# Patient Record
Sex: Female | Born: 1950 | Race: Black or African American | Hispanic: No
Health system: Southern US, Community
[De-identification: ages and names within clinical notes are randomized; demographics above are authoritative.]

## PROBLEM LIST (undated history)

## (undated) ENCOUNTER — Emergency Department (HOSPITAL_COMMUNITY): Payer: Medicare Other

## (undated) DIAGNOSIS — F319 Bipolar disorder, unspecified: Secondary | ICD-10-CM

## (undated) DIAGNOSIS — I639 Cerebral infarction, unspecified: Secondary | ICD-10-CM

## (undated) DIAGNOSIS — G629 Polyneuropathy, unspecified: Secondary | ICD-10-CM

## (undated) DIAGNOSIS — I10 Essential (primary) hypertension: Secondary | ICD-10-CM

## (undated) DIAGNOSIS — R7303 Prediabetes: Secondary | ICD-10-CM

## (undated) DIAGNOSIS — M797 Fibromyalgia: Secondary | ICD-10-CM

## (undated) DIAGNOSIS — M1990 Unspecified osteoarthritis, unspecified site: Secondary | ICD-10-CM

## (undated) DIAGNOSIS — D869 Sarcoidosis, unspecified: Secondary | ICD-10-CM

## (undated) DIAGNOSIS — D759 Disease of blood and blood-forming organs, unspecified: Secondary | ICD-10-CM

## (undated) DIAGNOSIS — J45909 Unspecified asthma, uncomplicated: Secondary | ICD-10-CM

## (undated) DIAGNOSIS — F329 Major depressive disorder, single episode, unspecified: Secondary | ICD-10-CM

## (undated) DIAGNOSIS — F32A Depression, unspecified: Secondary | ICD-10-CM

## (undated) DIAGNOSIS — R519 Headache, unspecified: Secondary | ICD-10-CM

## (undated) DIAGNOSIS — R51 Headache: Secondary | ICD-10-CM

## (undated) DIAGNOSIS — I2510 Atherosclerotic heart disease of native coronary artery without angina pectoris: Secondary | ICD-10-CM

## (undated) DIAGNOSIS — Z87442 Personal history of urinary calculi: Secondary | ICD-10-CM

## (undated) DIAGNOSIS — E785 Hyperlipidemia, unspecified: Secondary | ICD-10-CM

## (undated) DIAGNOSIS — G4730 Sleep apnea, unspecified: Secondary | ICD-10-CM

## (undated) DIAGNOSIS — D649 Anemia, unspecified: Secondary | ICD-10-CM

## (undated) HISTORY — PX: APPENDECTOMY: SHX54

## (undated) HISTORY — PX: BREAST SURGERY: SHX581

## (undated) HISTORY — PX: COLON SURGERY: SHX602

## (undated) HISTORY — PX: HERNIA REPAIR: SHX51

## (undated) HISTORY — DX: Anemia, unspecified: D64.9

## (undated) HISTORY — PX: BREAST LUMPECTOMY: SHX2

## (undated) HISTORY — PX: OTHER SURGICAL HISTORY: SHX169

## (undated) HISTORY — DX: Disease of blood and blood-forming organs, unspecified: D75.9

## (undated) HISTORY — PX: UMBILICAL HERNIA REPAIR: SHX196

## (undated) HISTORY — PX: TUBAL LIGATION: SHX77

## (undated) HISTORY — DX: Hyperlipidemia, unspecified: E78.5

## (undated) HISTORY — PX: CARDIAC CATHETERIZATION: SHX172

## (undated) HISTORY — DX: Sarcoidosis, unspecified: D86.9

## (undated) HISTORY — PX: CATARACT EXTRACTION: SUR2

## (undated) HISTORY — PX: ABDOMINAL HYSTERECTOMY: SHX81

---

## 1999-03-30 ENCOUNTER — Emergency Department (HOSPITAL_COMMUNITY): Admission: EM | Admit: 1999-03-30 | Discharge: 1999-03-30 | Payer: Self-pay | Admitting: Emergency Medicine

## 1999-03-30 ENCOUNTER — Encounter: Payer: Self-pay | Admitting: Emergency Medicine

## 2001-01-05 ENCOUNTER — Ambulatory Visit (HOSPITAL_COMMUNITY): Admission: RE | Admit: 2001-01-05 | Discharge: 2001-01-05 | Payer: Self-pay | Admitting: Internal Medicine

## 2001-01-05 ENCOUNTER — Encounter: Payer: Self-pay | Admitting: Internal Medicine

## 2001-01-13 ENCOUNTER — Encounter: Admission: RE | Admit: 2001-01-13 | Discharge: 2001-01-13 | Payer: Self-pay | Admitting: Internal Medicine

## 2001-01-13 ENCOUNTER — Encounter: Payer: Self-pay | Admitting: Internal Medicine

## 2001-01-26 ENCOUNTER — Other Ambulatory Visit: Admission: RE | Admit: 2001-01-26 | Discharge: 2001-01-26 | Payer: Self-pay | Admitting: Internal Medicine

## 2001-11-08 ENCOUNTER — Encounter: Payer: Self-pay | Admitting: Family Medicine

## 2001-11-08 ENCOUNTER — Encounter: Admission: RE | Admit: 2001-11-08 | Discharge: 2001-11-08 | Payer: Self-pay | Admitting: Family Medicine

## 2001-12-22 ENCOUNTER — Ambulatory Visit (HOSPITAL_COMMUNITY): Admission: RE | Admit: 2001-12-22 | Discharge: 2001-12-22 | Payer: Self-pay | Admitting: Neurology

## 2001-12-22 ENCOUNTER — Encounter: Payer: Self-pay | Admitting: Neurology

## 2002-01-22 ENCOUNTER — Encounter: Payer: Self-pay | Admitting: Neurology

## 2002-01-22 ENCOUNTER — Ambulatory Visit (HOSPITAL_COMMUNITY): Admission: RE | Admit: 2002-01-22 | Discharge: 2002-01-22 | Payer: Self-pay | Admitting: Neurology

## 2002-03-21 ENCOUNTER — Encounter: Payer: Self-pay | Admitting: Family Medicine

## 2002-03-21 ENCOUNTER — Ambulatory Visit (HOSPITAL_COMMUNITY): Admission: RE | Admit: 2002-03-21 | Discharge: 2002-03-21 | Payer: Self-pay | Admitting: Family Medicine

## 2003-01-05 ENCOUNTER — Encounter: Payer: Self-pay | Admitting: Rheumatology

## 2003-01-05 ENCOUNTER — Ambulatory Visit (HOSPITAL_COMMUNITY): Admission: RE | Admit: 2003-01-05 | Discharge: 2003-01-05 | Payer: Self-pay | Admitting: Rheumatology

## 2003-06-08 ENCOUNTER — Encounter: Admission: RE | Admit: 2003-06-08 | Discharge: 2003-06-08 | Payer: Self-pay | Admitting: Family Medicine

## 2003-06-08 ENCOUNTER — Encounter: Payer: Self-pay | Admitting: Family Medicine

## 2003-08-09 ENCOUNTER — Emergency Department (HOSPITAL_COMMUNITY): Admission: AD | Admit: 2003-08-09 | Discharge: 2003-08-09 | Payer: Self-pay | Admitting: Family Medicine

## 2003-11-23 ENCOUNTER — Emergency Department (HOSPITAL_COMMUNITY): Admission: EM | Admit: 2003-11-23 | Discharge: 2003-11-23 | Payer: Self-pay | Admitting: Emergency Medicine

## 2004-03-14 ENCOUNTER — Emergency Department (HOSPITAL_COMMUNITY): Admission: EM | Admit: 2004-03-14 | Discharge: 2004-03-14 | Payer: Self-pay | Admitting: Family Medicine

## 2004-03-21 ENCOUNTER — Other Ambulatory Visit: Admission: RE | Admit: 2004-03-21 | Discharge: 2004-03-21 | Payer: Self-pay | Admitting: Obstetrics and Gynecology

## 2004-06-23 ENCOUNTER — Ambulatory Visit (HOSPITAL_COMMUNITY): Admission: RE | Admit: 2004-06-23 | Discharge: 2004-06-23 | Payer: Self-pay | Admitting: Gastroenterology

## 2004-07-01 ENCOUNTER — Ambulatory Visit: Payer: Self-pay | Admitting: Cardiology

## 2004-07-03 ENCOUNTER — Encounter: Admission: RE | Admit: 2004-07-03 | Discharge: 2004-07-03 | Payer: Self-pay | Admitting: Cardiology

## 2004-08-12 ENCOUNTER — Ambulatory Visit: Payer: Self-pay | Admitting: Cardiology

## 2004-09-03 ENCOUNTER — Ambulatory Visit: Payer: Self-pay | Admitting: Cardiology

## 2004-09-10 ENCOUNTER — Encounter: Admission: RE | Admit: 2004-09-10 | Discharge: 2004-12-09 | Payer: Self-pay | Admitting: Cardiology

## 2004-10-29 ENCOUNTER — Ambulatory Visit: Payer: Self-pay | Admitting: Cardiology

## 2004-11-07 ENCOUNTER — Ambulatory Visit: Payer: Self-pay | Admitting: Psychiatry

## 2004-11-07 ENCOUNTER — Inpatient Hospital Stay (HOSPITAL_COMMUNITY): Admission: RE | Admit: 2004-11-07 | Discharge: 2004-11-13 | Payer: Self-pay | Admitting: Psychiatry

## 2004-11-11 ENCOUNTER — Ambulatory Visit (HOSPITAL_COMMUNITY): Admission: RE | Admit: 2004-11-11 | Discharge: 2004-11-11 | Payer: Self-pay | Admitting: Psychiatry

## 2004-12-03 ENCOUNTER — Ambulatory Visit: Payer: Self-pay | Admitting: Cardiology

## 2005-01-14 ENCOUNTER — Ambulatory Visit: Payer: Self-pay | Admitting: Cardiology

## 2005-02-02 ENCOUNTER — Emergency Department (HOSPITAL_COMMUNITY): Admission: EM | Admit: 2005-02-02 | Discharge: 2005-02-02 | Payer: Self-pay | Admitting: Emergency Medicine

## 2005-04-28 ENCOUNTER — Other Ambulatory Visit: Admission: RE | Admit: 2005-04-28 | Discharge: 2005-04-28 | Payer: Self-pay | Admitting: Obstetrics and Gynecology

## 2005-05-22 ENCOUNTER — Encounter (INDEPENDENT_AMBULATORY_CARE_PROVIDER_SITE_OTHER): Payer: Self-pay | Admitting: *Deleted

## 2005-05-22 ENCOUNTER — Inpatient Hospital Stay (HOSPITAL_COMMUNITY): Admission: RE | Admit: 2005-05-22 | Discharge: 2005-05-31 | Payer: Self-pay | Admitting: Obstetrics and Gynecology

## 2005-12-16 ENCOUNTER — Emergency Department (HOSPITAL_COMMUNITY): Admission: EM | Admit: 2005-12-16 | Discharge: 2005-12-16 | Payer: Self-pay | Admitting: Family Medicine

## 2006-06-29 ENCOUNTER — Encounter: Admission: RE | Admit: 2006-06-29 | Discharge: 2006-06-29 | Payer: Self-pay | Admitting: Obstetrics and Gynecology

## 2006-08-05 ENCOUNTER — Emergency Department (HOSPITAL_COMMUNITY): Admission: EM | Admit: 2006-08-05 | Discharge: 2006-08-05 | Payer: Self-pay | Admitting: Family Medicine

## 2007-01-11 ENCOUNTER — Encounter: Admission: RE | Admit: 2007-01-11 | Discharge: 2007-01-11 | Payer: Self-pay | Admitting: Obstetrics and Gynecology

## 2007-03-29 ENCOUNTER — Ambulatory Visit (HOSPITAL_COMMUNITY): Admission: RE | Admit: 2007-03-29 | Discharge: 2007-03-29 | Payer: Self-pay | Admitting: Family Medicine

## 2007-06-27 ENCOUNTER — Emergency Department (HOSPITAL_COMMUNITY): Admission: EM | Admit: 2007-06-27 | Discharge: 2007-06-27 | Payer: Self-pay | Admitting: Emergency Medicine

## 2007-07-01 ENCOUNTER — Encounter: Admission: RE | Admit: 2007-07-01 | Discharge: 2007-07-01 | Payer: Self-pay | Admitting: Family Medicine

## 2008-05-30 ENCOUNTER — Emergency Department (HOSPITAL_COMMUNITY): Admission: EM | Admit: 2008-05-30 | Discharge: 2008-05-31 | Payer: Self-pay | Admitting: Emergency Medicine

## 2009-01-08 ENCOUNTER — Ambulatory Visit (HOSPITAL_COMMUNITY): Admission: RE | Admit: 2009-01-08 | Discharge: 2009-01-08 | Payer: Self-pay | Admitting: Rheumatology

## 2009-05-18 ENCOUNTER — Emergency Department (HOSPITAL_COMMUNITY): Admission: EM | Admit: 2009-05-18 | Discharge: 2009-05-19 | Payer: Self-pay | Admitting: Emergency Medicine

## 2009-05-21 ENCOUNTER — Inpatient Hospital Stay (HOSPITAL_COMMUNITY): Admission: EM | Admit: 2009-05-21 | Discharge: 2009-05-25 | Payer: Self-pay | Admitting: Emergency Medicine

## 2009-07-01 ENCOUNTER — Encounter: Admission: RE | Admit: 2009-07-01 | Discharge: 2009-07-01 | Payer: Self-pay | Admitting: Gastroenterology

## 2009-07-26 ENCOUNTER — Ambulatory Visit: Payer: Self-pay | Admitting: Oncology

## 2009-08-01 LAB — CBC WITH DIFFERENTIAL/PLATELET
Basophils Absolute: 0 10*3/uL (ref 0.0–0.1)
EOS%: 3.5 % (ref 0.0–7.0)
Eosinophils Absolute: 0.3 10*3/uL (ref 0.0–0.5)
HCT: 35.2 % (ref 34.8–46.6)
HGB: 11.4 g/dL — ABNORMAL LOW (ref 11.6–15.9)
MCH: 27.5 pg (ref 25.1–34.0)
MONO#: 0.6 10*3/uL (ref 0.1–0.9)
NEUT#: 3.3 10*3/uL (ref 1.5–6.5)
NEUT%: 45.8 % (ref 38.4–76.8)
RDW: 13.2 % (ref 11.2–14.5)
WBC: 7.2 10*3/uL (ref 3.9–10.3)
lymph#: 3 10*3/uL (ref 0.9–3.3)

## 2009-08-05 ENCOUNTER — Ambulatory Visit (HOSPITAL_COMMUNITY): Admission: RE | Admit: 2009-08-05 | Discharge: 2009-08-05 | Payer: Self-pay | Admitting: Oncology

## 2009-08-05 LAB — COMPREHENSIVE METABOLIC PANEL
AST: 14 U/L (ref 0–37)
Albumin: 4.1 g/dL (ref 3.5–5.2)
BUN: 17 mg/dL (ref 6–23)
CO2: 27 mEq/L (ref 19–32)
Calcium: 9.2 mg/dL (ref 8.4–10.5)
Chloride: 99 mEq/L (ref 96–112)
Creatinine, Ser: 1.24 mg/dL — ABNORMAL HIGH (ref 0.40–1.20)
Potassium: 3.6 mEq/L (ref 3.5–5.3)

## 2009-08-05 LAB — SPEP & IFE WITH QIG
Albumin ELP: 48.8 % — ABNORMAL LOW (ref 55.8–66.1)
Alpha-1-Globulin: 4.5 % (ref 2.9–4.9)
Alpha-2-Globulin: 11.4 % (ref 7.1–11.8)
Beta 2: 9.5 % — ABNORMAL HIGH (ref 3.2–6.5)
IgM, Serum: 301 mg/dL — ABNORMAL HIGH (ref 60–263)

## 2009-08-05 LAB — LACTATE DEHYDROGENASE: LDH: 158 U/L (ref 94–250)

## 2009-08-05 LAB — KAPPA/LAMBDA LIGHT CHAINS
Kappa:Lambda Ratio: 1.02 (ref 0.26–1.65)
Lambda Free Lght Chn: 1.69 mg/dL (ref 0.57–2.63)

## 2009-08-14 ENCOUNTER — Encounter: Admission: RE | Admit: 2009-08-14 | Discharge: 2009-08-30 | Payer: Self-pay | Admitting: Sports Medicine

## 2009-08-22 ENCOUNTER — Ambulatory Visit: Payer: Self-pay | Admitting: Oncology

## 2009-09-12 ENCOUNTER — Encounter: Admission: RE | Admit: 2009-09-12 | Discharge: 2009-10-09 | Payer: Self-pay | Admitting: Sports Medicine

## 2010-02-14 ENCOUNTER — Ambulatory Visit: Payer: Self-pay | Admitting: Oncology

## 2010-02-25 LAB — CBC WITH DIFFERENTIAL/PLATELET
EOS%: 2.4 % (ref 0.0–7.0)
Eosinophils Absolute: 0.2 10*3/uL (ref 0.0–0.5)
LYMPH%: 33.8 % (ref 14.0–49.7)
MCV: 84.1 fL (ref 79.5–101.0)
MONO#: 0.5 10*3/uL (ref 0.1–0.9)
MONO%: 7.1 % (ref 0.0–14.0)
NEUT#: 3.8 10*3/uL (ref 1.5–6.5)
NEUT%: 56.4 % (ref 38.4–76.8)
RDW: 13.8 % (ref 11.2–14.5)
lymph#: 2.3 10*3/uL (ref 0.9–3.3)

## 2010-02-27 LAB — SPEP & IFE WITH QIG
Albumin ELP: 50 % — ABNORMAL LOW (ref 55.8–66.1)
Beta 2: 8.1 % — ABNORMAL HIGH (ref 3.2–6.5)
Gamma Globulin: 18.4 % (ref 11.1–18.8)
IgA: 511 mg/dL — ABNORMAL HIGH (ref 68–378)
IgM, Serum: 275 mg/dL — ABNORMAL HIGH (ref 60–263)

## 2010-02-27 LAB — KAPPA/LAMBDA LIGHT CHAINS
Kappa:Lambda Ratio: 0.83 (ref 0.26–1.65)
Lambda Free Lght Chn: 1.49 mg/dL (ref 0.57–2.63)

## 2010-02-27 LAB — COMPREHENSIVE METABOLIC PANEL
AST: 14 U/L (ref 0–37)
Alkaline Phosphatase: 92 U/L (ref 39–117)
Glucose, Bld: 101 mg/dL — ABNORMAL HIGH (ref 70–99)
Sodium: 139 mEq/L (ref 135–145)
Total Bilirubin: 0.3 mg/dL (ref 0.3–1.2)
Total Protein: 7.1 g/dL (ref 6.0–8.3)

## 2010-03-05 ENCOUNTER — Emergency Department (HOSPITAL_COMMUNITY): Admission: EM | Admit: 2010-03-05 | Discharge: 2010-03-05 | Payer: Self-pay | Admitting: Emergency Medicine

## 2010-05-30 ENCOUNTER — Emergency Department (HOSPITAL_COMMUNITY): Admission: EM | Admit: 2010-05-30 | Discharge: 2010-05-30 | Payer: Self-pay | Admitting: Emergency Medicine

## 2010-08-28 ENCOUNTER — Ambulatory Visit: Payer: Self-pay | Admitting: Oncology

## 2010-09-03 LAB — CBC WITH DIFFERENTIAL/PLATELET
BASO%: 0.3 % (ref 0.0–2.0)
Basophils Absolute: 0 10*3/uL (ref 0.0–0.1)
EOS%: 3.1 % (ref 0.0–7.0)
Eosinophils Absolute: 0.2 10*3/uL (ref 0.0–0.5)
HCT: 35.6 % (ref 34.8–46.6)
HGB: 11.7 g/dL (ref 11.6–15.9)
LYMPH%: 46.1 % (ref 14.0–49.7)
MCH: 27.1 pg (ref 25.1–34.0)
MCHC: 32.9 g/dL (ref 31.5–36.0)
MCV: 82.4 fL (ref 79.5–101.0)
MONO#: 0.5 10*3/uL (ref 0.1–0.9)
MONO%: 7.3 % (ref 0.0–14.0)
NEUT#: 2.7 10*3/uL (ref 1.5–6.5)
NEUT%: 43.2 % (ref 38.4–76.8)
Platelets: 189 10*3/uL (ref 145–400)
RBC: 4.32 10*6/uL (ref 3.70–5.45)
RDW: 12.9 % (ref 11.2–14.5)
WBC: 6.2 10*3/uL (ref 3.9–10.3)
lymph#: 2.8 10*3/uL (ref 0.9–3.3)

## 2010-09-05 LAB — COMPREHENSIVE METABOLIC PANEL
ALT: 10 U/L (ref 0–35)
AST: 17 U/L (ref 0–37)
Albumin: 3.8 g/dL (ref 3.5–5.2)
Alkaline Phosphatase: 98 U/L (ref 39–117)
BUN: 8 mg/dL (ref 6–23)
CO2: 28 mEq/L (ref 19–32)
Calcium: 9.1 mg/dL (ref 8.4–10.5)
Chloride: 99 mEq/L (ref 96–112)
Creatinine, Ser: 0.97 mg/dL (ref 0.40–1.20)
Glucose, Bld: 95 mg/dL (ref 70–99)
Potassium: 3.6 mEq/L (ref 3.5–5.3)
Sodium: 137 mEq/L (ref 135–145)
Total Bilirubin: 0.3 mg/dL (ref 0.3–1.2)
Total Protein: 7.4 g/dL (ref 6.0–8.3)

## 2010-09-05 LAB — SPEP & IFE WITH QIG
Albumin ELP: 49.5 % — ABNORMAL LOW (ref 55.8–66.1)
Alpha-1-Globulin: 4.5 % (ref 2.9–4.9)
Alpha-2-Globulin: 11.3 % (ref 7.1–11.8)
Beta 2: 9.4 % — ABNORMAL HIGH (ref 3.2–6.5)
Beta Globulin: 6.7 % (ref 4.7–7.2)
Gamma Globulin: 18.6 % (ref 11.1–18.8)
IgA: 524 mg/dL — ABNORMAL HIGH (ref 68–378)
IgG (Immunoglobin G), Serum: 1270 mg/dL (ref 694–1618)
IgM, Serum: 284 mg/dL — ABNORMAL HIGH (ref 60–263)
Total Protein, Serum Electrophoresis: 7.4 g/dL (ref 6.0–8.3)

## 2010-09-05 LAB — KAPPA/LAMBDA LIGHT CHAINS
Kappa free light chain: 1.1 mg/dL (ref 0.33–1.94)
Kappa:Lambda Ratio: 1.12 (ref 0.26–1.65)
Lambda Free Lght Chn: 0.98 mg/dL (ref 0.57–2.63)

## 2010-10-22 ENCOUNTER — Other Ambulatory Visit: Payer: Self-pay

## 2010-10-22 ENCOUNTER — Ambulatory Visit (HOSPITAL_COMMUNITY)
Admission: RE | Admit: 2010-10-22 | Discharge: 2010-10-22 | Disposition: A | Discharge status: Home / Self Care | Payer: Commercial Managed Care - PPO | Source: Ambulatory Visit | Attending: Family Medicine | Admitting: Family Medicine

## 2010-10-22 DIAGNOSIS — K5900 Constipation, unspecified: Secondary | ICD-10-CM | POA: Insufficient documentation

## 2010-10-22 DIAGNOSIS — R52 Pain, unspecified: Secondary | ICD-10-CM

## 2010-10-22 DIAGNOSIS — R109 Unspecified abdominal pain: Secondary | ICD-10-CM | POA: Insufficient documentation

## 2010-11-13 LAB — COMPREHENSIVE METABOLIC PANEL
ALT: 16 U/L (ref 0–35)
Albumin: 3.4 g/dL — ABNORMAL LOW (ref 3.5–5.2)
Alkaline Phosphatase: 108 U/L (ref 39–117)
BUN: 14 mg/dL (ref 6–23)
Chloride: 100 mEq/L (ref 96–112)
Glucose, Bld: 109 mg/dL — ABNORMAL HIGH (ref 70–99)
Potassium: 3.4 mEq/L — ABNORMAL LOW (ref 3.5–5.1)
Sodium: 137 mEq/L (ref 135–145)
Total Bilirubin: 0.4 mg/dL (ref 0.3–1.2)
Total Protein: 7.5 g/dL (ref 6.0–8.3)

## 2010-11-13 LAB — CBC
MCH: 27.3 pg (ref 26.0–34.0)
MCHC: 32.5 g/dL (ref 30.0–36.0)
Platelets: 171 10*3/uL (ref 150–400)
RDW: 13.4 % (ref 11.5–15.5)

## 2010-11-13 LAB — URINALYSIS, ROUTINE W REFLEX MICROSCOPIC
Bilirubin Urine: NEGATIVE
Ketones, ur: NEGATIVE mg/dL
Nitrite: NEGATIVE
Urobilinogen, UA: 0.2 mg/dL (ref 0.0–1.0)

## 2010-11-13 LAB — DIFFERENTIAL
Basophils Absolute: 0 10*3/uL (ref 0.0–0.1)
Basophils Relative: 0 % (ref 0–1)
Eosinophils Absolute: 0.2 10*3/uL (ref 0.0–0.7)
Monocytes Absolute: 0.5 10*3/uL (ref 0.1–1.0)
Monocytes Relative: 6 % (ref 3–12)
Neutro Abs: 4 10*3/uL (ref 1.7–7.7)

## 2010-11-13 LAB — CK: Total CK: 211 U/L — ABNORMAL HIGH (ref 7–177)

## 2010-12-05 LAB — POCT I-STAT, CHEM 8
BUN: 14 mg/dL (ref 6–23)
Calcium, Ion: 1.12 mmol/L (ref 1.12–1.32)
Chloride: 98 mEq/L (ref 96–112)
HCT: 43 % (ref 36.0–46.0)
Potassium: 3.7 mEq/L (ref 3.5–5.1)
Sodium: 136 mEq/L (ref 135–145)

## 2010-12-05 LAB — URINALYSIS, MICROSCOPIC ONLY
Bilirubin Urine: NEGATIVE
Hgb urine dipstick: NEGATIVE
Ketones, ur: NEGATIVE mg/dL
Leukocytes, UA: NEGATIVE
Nitrite: NEGATIVE
Protein, ur: NEGATIVE mg/dL
Urobilinogen, UA: 1 mg/dL (ref 0.0–1.0)
pH: 6 (ref 5.0–8.0)

## 2010-12-05 LAB — DIFFERENTIAL
Basophils Absolute: 0 10*3/uL (ref 0.0–0.1)
Basophils Absolute: 0 10*3/uL (ref 0.0–0.1)
Basophils Relative: 0 % (ref 0–1)
Basophils Relative: 0 % (ref 0–1)
Eosinophils Absolute: 0.1 K/uL (ref 0.0–0.7)
Eosinophils Relative: 1 % (ref 0–5)
Lymphocytes Relative: 17 % (ref 12–46)
Lymphocytes Relative: 24 % (ref 12–46)
Lymphs Abs: 2.1 K/uL (ref 0.7–4.0)
Lymphs Abs: 2.5 10*3/uL (ref 0.7–4.0)
Monocytes Absolute: 0.6 10*3/uL (ref 0.1–1.0)
Monocytes Absolute: 0.6 K/uL (ref 0.1–1.0)
Monocytes Relative: 10 % (ref 3–12)
Monocytes Relative: 7 % (ref 3–12)
Neutro Abs: 2.8 10*3/uL (ref 1.7–7.7)
Neutro Abs: 6 10*3/uL (ref 1.7–7.7)
Neutro Abs: 8.6 10*3/uL — ABNORMAL HIGH (ref 1.7–7.7)
Neutrophils Relative %: 46 % (ref 43–77)
Neutrophils Relative %: 68 % (ref 43–77)
Neutrophils Relative %: 80 % — ABNORMAL HIGH (ref 43–77)

## 2010-12-05 LAB — COMPREHENSIVE METABOLIC PANEL
ALT: 13 U/L (ref 0–35)
AST: 16 U/L (ref 0–37)
BUN: 7 mg/dL (ref 6–23)
CO2: 28 mEq/L (ref 19–32)
CO2: 30 mEq/L (ref 19–32)
Chloride: 108 mEq/L (ref 96–112)
Chloride: 98 mEq/L (ref 96–112)
Creatinine, Ser: 1 mg/dL (ref 0.4–1.2)
Creatinine, Ser: 1.11 mg/dL (ref 0.4–1.2)
GFR calc Af Amer: >60 mL/min (ref >60)
GFR calc non Af Amer: 51 mL/min — ABNORMAL LOW (ref >60)
Total Bilirubin: 0.6 mg/dL (ref 0.3–1.2)

## 2010-12-05 LAB — LIPID PANEL: HDL: 49 mg/dL (ref >39)

## 2010-12-05 LAB — BASIC METABOLIC PANEL
BUN: 3 mg/dL — ABNORMAL LOW (ref 6–23)
BUN: 3 mg/dL — ABNORMAL LOW (ref 6–23)
CO2: 26 mEq/L (ref 19–32)
CO2: 28 mEq/L (ref 19–32)
Calcium: 8.8 mg/dL (ref 8.4–10.5)
Chloride: 104 mEq/L (ref 96–112)
Chloride: 105 mEq/L (ref 96–112)
GFR calc Af Amer: >60 mL/min (ref >60)
GFR calc Af Amer: >60 mL/min (ref >60)
GFR calc non Af Amer: 57 mL/min — ABNORMAL LOW (ref >60)
Glucose, Bld: 107 mg/dL — ABNORMAL HIGH (ref 70–99)
Glucose, Bld: 108 mg/dL — ABNORMAL HIGH (ref 70–99)
Potassium: 3.6 mEq/L (ref 3.5–5.1)
Potassium: 3.8 mEq/L (ref 3.5–5.1)
Potassium: 3.8 mEq/L (ref 3.5–5.1)
Sodium: 140 mEq/L (ref 135–145)

## 2010-12-05 LAB — COMPREHENSIVE METABOLIC PANEL WITH GFR
ALT: 16 U/L (ref 0–35)
AST: 19 U/L (ref 0–37)
Albumin: 3.3 g/dL — ABNORMAL LOW (ref 3.5–5.2)
Alkaline Phosphatase: 77 U/L (ref 39–117)
Calcium: 9 mg/dL (ref 8.4–10.5)
GFR calc Af Amer: >60 mL/min (ref >60)
Glucose, Bld: 101 mg/dL — ABNORMAL HIGH (ref 70–99)
Potassium: 3.7 meq/L (ref 3.5–5.1)
Sodium: 136 meq/L (ref 135–145)
Total Protein: 7.3 g/dL (ref 6.0–8.3)

## 2010-12-05 LAB — CBC
HCT: 30.5 % — ABNORMAL LOW (ref 36.0–46.0)
HCT: 31.4 % — ABNORMAL LOW (ref 36.0–46.0)
HCT: 32.1 % — ABNORMAL LOW (ref 36.0–46.0)
HCT: 36.5 % (ref 36.0–46.0)
HCT: 36.8 % (ref 36.0–46.0)
Hemoglobin: 10.2 g/dL — ABNORMAL LOW (ref 12.0–15.0)
Hemoglobin: 12 g/dL (ref 12.0–15.0)
Hemoglobin: 12.1 g/dL (ref 12.0–15.0)
Hemoglobin: 13.4 g/dL (ref 12.0–15.0)
MCHC: 32.9 g/dL (ref 30.0–36.0)
MCHC: 33.3 g/dL (ref 30.0–36.0)
MCHC: 33.6 g/dL (ref 30.0–36.0)
MCV: 84.5 fL (ref 78.0–100.0)
MCV: 85 fL (ref 78.0–100.0)
MCV: 85 fL (ref 78.0–100.0)
MCV: 85.2 fL (ref 78.0–100.0)
Platelets: 154 10*3/uL (ref 150–400)
Platelets: 193 10*3/uL (ref 150–400)
Platelets: 217 K/uL (ref 150–400)
Platelets: 270 10*3/uL (ref 150–400)
RBC: 3.62 MIL/uL — ABNORMAL LOW (ref 3.87–5.11)
RBC: 3.78 MIL/uL — ABNORMAL LOW (ref 3.87–5.11)
RBC: 4.32 MIL/uL (ref 3.87–5.11)
RDW: 13.3 % (ref 11.5–15.5)
RDW: 13.8 % (ref 11.5–15.5)
RDW: 13.9 % (ref 11.5–15.5)
RDW: 14.1 % (ref 11.5–15.5)
WBC: 5.6 10*3/uL (ref 4.0–10.5)
WBC: 6 10*3/uL (ref 4.0–10.5)
WBC: 6.5 10*3/uL (ref 4.0–10.5)
WBC: 8.9 10*3/uL (ref 4.0–10.5)

## 2010-12-05 LAB — URINALYSIS, ROUTINE W REFLEX MICROSCOPIC
Bilirubin Urine: NEGATIVE
Glucose, UA: NEGATIVE mg/dL
Glucose, UA: NEGATIVE mg/dL
Hgb urine dipstick: NEGATIVE
Ketones, ur: NEGATIVE mg/dL
Ketones, ur: NEGATIVE mg/dL
Nitrite: NEGATIVE
Nitrite: NEGATIVE
Protein, ur: NEGATIVE mg/dL
Specific Gravity, Urine: 1.009 (ref 1.005–1.030)
Specific Gravity, Urine: >1.046 — ABNORMAL HIGH (ref 1.005–1.030)
Urobilinogen, UA: 0.2 mg/dL (ref 0.0–1.0)
pH: 6 (ref 5.0–8.0)
pH: 7.5 (ref 5.0–8.0)

## 2010-12-05 LAB — LIPASE, BLOOD: Lipase: 30 U/L (ref 11–59)

## 2010-12-05 LAB — URINE CULTURE
Colony Count: 90000
Colony Count: NO GROWTH

## 2010-12-05 LAB — FERRITIN: Ferritin: 191 ng/mL (ref 10–291)

## 2010-12-05 LAB — HEMOCCULT GUIAC POC 1CARD (OFFICE): Fecal Occult Bld: NEGATIVE

## 2010-12-05 LAB — T4, FREE: Free T4: 0.73 ng/dL — ABNORMAL LOW (ref 0.80–1.80)

## 2010-12-05 LAB — VITAMIN B12: Vitamin B-12: 332 pg/mL (ref 211–911)

## 2010-12-05 LAB — CULTURE, BLOOD (ROUTINE X 2): Culture: NO GROWTH

## 2010-12-05 LAB — IRON AND TIBC
Iron: 28 ug/dL — ABNORMAL LOW (ref 42–135)
UIBC: 221 ug/dL

## 2010-12-05 LAB — MAGNESIUM: Magnesium: 2.2 mg/dL (ref 1.5–2.5)

## 2010-12-05 LAB — TSH: TSH: 3.558 u[IU]/mL (ref 0.350–4.500)

## 2011-01-16 NOTE — Consult Note (Signed)
Veronica Baker, Veronica Baker             ACCOUNT NO.:  [id number]   MEDICAL RECORD NO.:  [id number]          PATIENT TYPE:  INP   LOCATION:  9318                          FACILITY:  [facility]   PHYSICIAN:  Sandria Bales. Ezzard Standing, M.D.  DATE OF BIRTH:  Nov 12, 1950   DATE OF CONSULTATION:  DATE OF DISCHARGE:                                   CONSULTATION   GENERAL SURGICAL CONSULTATION   REASON FOR CONSULTATION:  Postoperative abdominal distention.   HISTORY OF PRESENT ILLNESS:  Veronica Baker is a 60 year old black female who  identifies Dr. Thayer Headings as her primary medical doctor.  Dr. Mancel Bale  is her gynecologist.  She underwent a bilateral salpingo-oophorectomy on  May 22, 2005 by Dr. Marcelle Overlie. This started out as laparoscopic and was  converted to open because of extensive adhesions. He did have to free up at  least one loop of small bowel.   Postoperatively the patient has been plagued with abdominal distention,  nausea and vomiting.  A KUB shows evidence of either ileus versus bowel  obstruction. She also has some bladder dysfunction and has required a Foley  catheter placed.   From a prior abdominal standpoint, she had an umbilical hernia repair in  about 1970.  She had a hysterectomy at about 47.  She had an appendectomy  about 1992 in [place]. She denies history of peptic ulcer disease,  liver disease, pancreatic disease. She did undergo a colonoscopy a year or  two ago but she does not remember the name of the doctor who did the  endoscopy.   REVIEW OF SYSTEMS:  NEUROLOGICAL: She has a history of bipolar disease  followed by Dr. Montez Morita. She says she had a break-down at work.  Her  medications,  Risperdal, Lamictal, Zoloft, cyclobenzaprine, I think are all  aimed at her bipolar disease.  PULMONARY:  She has had no history of pneumonia or tuberculosis.  CARDIAC:  She has had no chest pain or angina.  GASTROINTESTINAL: See history of present illness. She does have what may  be  a history of hiatal hernia.  UROLOGIC:  She has a history of kidney stones.  GYN:  She is a gravida 2, para 2.  Again, she had a hysterectomy  a number  of years ago.  She came in for bilateral salpingo-oophorectomy this  admission.   SOCIAL HISTORY:  She works at [employer] [place] think in [place].  Her  husband is a Naval architect.   PHYSICAL EXAMINATION:  VITAL SIGNS:  Blood pressure is 142/97,  heart rate  101, temperature 97.9.  GENERAL APPEARANCE:  She is a well-nourished, moderately obese, very anxious  black female.  HEENT: Unremarkable.  NECK:  Supple without masses or thyromegaly.  LUNGS:  Clear to auscultation.  HEART:  Has a regular rate and rhythm.  I hear no murmurs or rubs.  ABDOMEN:  Distended.  It is fairly tight. She has very hypoactive bowel  sounds.  Her abdominal incision looks good with no cellulitis or redness  that I can see.  She, however, has no abdominal tenderness, rebound or  guarding.  She is distended enough that it is not possible to feel a mass.  EXTREMITIES:  Good strength in all four extremities.  NEUROLOGICAL:  Grossly intact.   LABORATORY DATA:  Labs that I have show a white blood cell count of 11,300,  hemoglobin 10, hematocrit 30. Sodium is 135, potassium 3.9, chloride 85, cO2  29, BUN 10, creatinine 1.2.  Her KUB shows a dilated stomach and also dilated small bowel but really  looks more consistent with an ileus right now than a small bowel  obstruction.   DIAGNOSIS:  1.  Probable postoperative ileus, though could be early mechanical bowel      obstruction.  The patient has already had three traumatic attempts to      place an NG tube today and is very upset about the idea of another      attempt at placing an NGT.  She claims to have passed some gas just      recently so I think it is worth a trial  of leaving the NGT out.  It is      important  for her to get up and do a lot of walking and moving and I      stressed this to her.  I  agree with the need to keep her on intravenous      fluids.  Recheck her labs in the morning and a KUB and hopefully this      will resolve without placement of an NG tube.  If an NG tube does need      to be placed, it may actually even be better done under some kind of      mild sedation in the operating room and I discussed this with her.  [I will out of town the rest of the week and I will ask one of my partners  to follow her.  Dr. Marca Ancona is on call for [place] tomorrow, Sept. 27.)  1.  Status post bilateral salpingo-oophorectomy for benign disease.  2.  Bipolar disease which I think is certainly going to play a role in      trying to manage her from here on.  3.  Moderate obesity.      Sandria Bales. Ezzard Standing, M.D.  Electronically Signed     DHN/MEDQ  D:  05/26/2005  T:  05/26/2005  Job:  [id number]   cc:   Jocelyn Lamer D. Pecola Leisure, M.D.  Fax: [phone]   Duke Salvia. Marcelle Overlie, M.D.  Fax: [phone]

## 2011-01-16 NOTE — Op Note (Signed)
NAMEMIQUEL, LAMSON             ACCOUNT NO.:  [id number]   MEDICAL RECORD NO.:  [id number]          PATIENT TYPE:  AMB   LOCATION:  ENDO                         [facility]ACILITY:  [facility]   PHYSICIAN:  John C. Madilyn Fireman, M.D.    DATE OF BIRTH:  01/08/1951   DATE OF PROCEDURE:  06/23/2004  DATE OF DISCHARGE:                                 OPERATIVE REPORT   PROCEDURE:  Colonoscopy.   INDICATION FOR PROCEDURE:  Rectal bleeding in a 60 year old patient with no  prior colon screening.   PROCEDURE:  The patient was placed in the left lateral decubitus position  and placed on the pulse monitor with continuous low-flow oxygen delivered  with nasal cannula.  She was sedated with 100 mcg IV fentanyl and 7.5 mg IV  Versed.  The Olympus video colonoscope was inserted into the rectum and  advanced to the cecum, confirmed by transillumination of McBurney's point  and visualization of the ileocecal valve and appendiceal orifice.  The prep  was excellent.  The cecum, ascending, transverse, descending, and sigmoid  colon all appeared normal with no masses, polyps, diverticula, or other  mucosal abnormalities.  The rectum likewise appeared normal, and retroflexed  view of the anus revealed moderate internal hemorrhoids.  The scope was then  withdrawn and the patient returned to the recovery room in stable condition.  She tolerated the procedure well, and there were no immediate complications.   IMPRESSION:  Internal hemorrhoids, otherwise normal study.   PLAN:  Consider next colon screening by sigmoidoscopy in five years and  colonoscopy in 10 years.      JCH/MEDQ  D:  06/23/2004  T:  06/23/2004  Job:  [id number]   cc:   Duke Salvia. Marcelle Overlie, M.D.  [address]  [place] [address]  Fax: [phone]

## 2011-01-16 NOTE — Discharge Summary (Signed)
NAMEMAIE, KESINGER             ACCOUNT NO.:  [id number]   MEDICAL RECORD NO.:  [id number]          PATIENT TYPE:  INP   LOCATION:  9318                          [facility]ACILITY:  [facility]   PHYSICIAN:  Duke Salvia. Marcelle Overlie, M.D.DATE OF BIRTH:  12/24/1950   DATE OF ADMISSION:  05/22/2005  DATE OF DISCHARGE:  05/31/2005                                 DISCHARGE SUMMARY   DISCHARGE DIAGNOSES:  1.  Pelvic pain.  2.  Open laparoscopy with laparotomy.  3.  Bilateral salpingo-oophorectomy.  4.  Dissection of extensive adhesions.  5.  Postoperative ileus.   HISTORY OF PRESENT ILLNESS:  For summary of the history of present illness,  please see admission H&P for details.  Briefly, a 60 year old postmenopausal  patient who has had a prior hysterectomy.  Was noted to have a left adnexal  cyst with pelvic pain, presents for BSO.   HOSPITAL COURSE:  On September 22, under general anesthesia the patient  underwent first open laparoscopy followed by a laparotomy with BSO.  Extensive adhesions were noted.  On the first postoperative day, her  hemoglobin was down 9, WBC 7100, and she was afebrile.  Her diet was slowly  advanced.  On the second postoperative day, she did experience some voiding  difficulties which she has had in the past with urinary retention and  required replacement on the catheter.  She was also having some mild  distention at that point and received a Dulcolax suppository.   On September 25, postoperative day #3, her abdomen was noted to be more  distended.  CMET was normal except for SGOT slightly elevated at 54.  WBC  9100.  Hemoglobin 10.4.  Abdominal x-ray was obtained at that point.  This  showed ileus.  This was discussed with the patient. She was placed NPO at  that point.   On postoperative day #4, September 26, she was experiencing some vomiting.  A repeat of the abdominal films showed no improvement in the ileus pattern  although it did not appear to be SBO.  Attempt was  made to place an NG tube  because of her vomiting without success.  Decision made to consult general  surgery at that time because of concern about the possibility of SBO.  They  agreed with most likely diagnosis being in ileus.  Other than hydration and  waiting.  Did not feel NG tube was indicated at that point because of the  difficulty we had trying to place it initially.   On postoperative day #5, she had experienced some mild wheezing, was placed  on respiratory treatments with albuterol followed by Alupent inhaler.  Chest  film and pulse oximetry were normal except for some mild atelectasis.  During that time, the catheter had been removed and she was voiding without  difficulty, was improved on her Alupent inhaler.  By September 28,  postoperative day #6, was totally afebrile.  Her nausea was improved.  She  did receive several doses of IV labetalol to control her blood pressure.  Was still NPO at that point.  On September 29, postoperative day #7,  abdominal  x-ray showed some improvement in the ileus pattern.  Her diet was  slowly increased after significant bowel movements and resolution on her  nausea with improvement in appetite.  By September 30, postoperative day #8,  blood pressure was 126/80. She remained afebrile.  Her abdominal distention  was much improved.  She received hydrochlorothiazide for lower extremity  edema, Pepcid for reflux symptoms and her diet was advanced.   By May 31, 2005, postoperative day #9, she was still experiencing some  post obstructive diarrhea but was afebrile and tolerating a regular diet.  Abdominal exam was much improved at that time.  The clips had been removed.  She was ready for discharge at that point.   PERTINENT LABORATORY DATA:  As noted above in the summary.  EKG showed  normal sinus rhythm.  Pathology is still pending.   DISPOSITION:  The patient is discharged on her usual medications along with  hydrochlorothiazide 50 mg  daily for lower extremity edema.   DISCHARGE MEDICATIONS:  She will continue her Caduet, Lamictal, Risperdal  p.r.n. and her Zoloft.   FOLLOW UP:  She will return to our office in three days.  Will follow up the  results of her Clostridium difficile studies at that time.  Repeat fasting  blood sugar and CMET at that point also.   She was given specific instructions regarding diet, exercise, along with  activity.  She will report any fever over 101, persistent diarrhea, nausea,  vomiting, or worsening abdominal distention as noted previously   CONDITION ON DISCHARGE:  Good.   ACTIVITY:  Graded increase.      Richard M. Marcelle Overlie, M.D.  Electronically Signed     RMH/MEDQ  D:  05/31/2005  T:  05/31/2005  Job:  [id number]

## 2011-01-16 NOTE — H&P (Signed)
Veronica Baker, Veronica Baker             ACCOUNT NO.:  [id number]   MEDICAL RECORD NO.:  [id number]           PATIENT TYPE:   LOCATION:                                 FACILITY:   PHYSICIAN:  Duke Salvia. Marcelle Overlie, M.D.    DATE OF BIRTH:   DATE OF ADMISSION:  05/22/2005  DATE OF DISCHARGE:                                HISTORY & PHYSICAL   CHIEF COMPLAINT:  Pelvic pain, ovarian cysts.   HISTORY OF PRESENT ILLNESS:  A 60 year old G2, P2.  This patient underwent a  TAH for hypermenorrhea in the past and has been followed since July of 2005  with pelvic pain that showed by ultrasound August 2005 simple cysts 4 x 2.5  x 2.5 on the left with the right side unremarkable.  No free fluid but she  continued to have pelvic pain.  CA-125 was checked which was 7 with FSH of  77.   Follow-up ultrasound September 2006 shows persistence of a cyst 3.4 x 2.4 x  2.8.  Because of pain she prefers to have oophorectomy.  She is scheduled  now for open laparoscopy with BSO, possible laparotomy with BSO.  This  procedure including risks of bleeding, infection, adjacent organ injury, the  possible need to complete the surgery in the open technique were all  reviewed with her which she understands and accepts.   PAST MEDICAL HISTORY:   ALLERGIES:  PENICILLIN which causes a rash.   CURRENT MEDICATIONS:  Risperdal, Lamictal, Zoloft, cyclobenzaprine.   PAST SURGICAL HISTORY:  1.  Hysterectomy.  2.  Umbilical hernia repair at age 40.  3.  Appendectomy.  4.  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   REVIEW OF SYSTEMS:  She had a blood transfusion in the late 1970s.  She has  been hospitalized in the past for depression and also a history of anemia  prior to her hysterectomy.  Restless leg syndrome, migraine headaches,  kidney stones, mild asthma, hiatal hernia.   FAMILY HISTORY:  Significant for diabetes and hypertension.   PAST OBSTETRICAL HISTORY:  She has had two cesareans in 1974 and 1975.   PHYSICAL EXAMINATION:  VITAL SIGNS:  Temperature 98.2, blood pressure  120/74.  HEENT:  Unremarkable.  NECK:  Supple without masses.  LUNGS:  Clear.  CARDIOVASCULAR:  Regular rate and rhythm without murmurs, rubs, or gallops  noted.  BREASTS:  Without masses.  ABDOMEN:  Soft, flat, nontender.  PELVIC:  Normal external genitalia.  Vaginal cuff was clear.  Bimanual  revealed some slight tenderness on the left side, no definite mass.  EXTREMITIES:  Unremarkable.  NEUROLOGIC:  Unremarkable.   IMPRESSION:  Pelvic pain, left ovarian simple cyst.   PLAN:  Open diagnostic laparoscopy with laparoscopic BSO, possible  laparotomy with BSO.  Procedure and risks reviewed as above.      Richard M. Marcelle Overlie, M.D.  Electronically Signed     RMH/MEDQ  D:  05/21/2005  T:  05/21/2005  Job:  [id number]

## 2011-01-16 NOTE — H&P (Signed)
Veronica Baker, Veronica Baker             ACCOUNT NO.:  [id number]   MEDICAL RECORD NO.:  [id number]          PATIENT TYPE:  IPS   LOCATION:  0506                          [facility]ILITY:  [facility]   PHYSICIAN:  Jeanice Lim, M.D. DATE OF BIRTH:  05-Jul-1951   DATE OF ADMISSION:  11/07/2004  DATE OF DISCHARGE:                         PSYCHIATRIC ADMISSION ASSESSMENT   IDENTIFYING INFORMATION:  This is a 60 year old married African-American  female.  Apparently, the patient was a walk-in last evening about a quarter  to 10.  She stated that she is having too much stress at work.  She has a  history for depression.  Her major stress at this time is that her mother  was hospitalized for psychosis and hypertension recently.  Today, she states  her mother is actually at home.  She states that her decompensation started  with her mother's admission to the hospital.  She also said that her mother  was admitted in 1998, which obviously is not recently.  Today, she is  sleepy.  She is slow to answer.  I asked her to draw a clock.  She cries  after she draws the clock, stating this makes me look stupid.  I am not  convinced that she is as severe as she is putting out.  At any rate, when  asked about psychosis, she stated she is not sure if she hears voices or if  it is her own thoughts.  Specifically, she has had no commands to hurt  herself.  She has no plan, although she reports that she has had suicidal  thoughts and she states that early in March she took some pills but decided  it was the wrong thing to do.  She does not state what pills they were and  obviously did not have to be hospitalized regarding this.   PAST PSYCHIATRIC HISTORY:  She reports having been an inpatient at [facility]  in 1994 for suicidal ideation.  Then, there is a lapse of 10 years.  She  acknowledges starting in treatment with Dr. Jennelle Human in 2004 to 2005.  She  states that she tried a variety of medications.  However, she stopped  them  because her husband kept calling her crazy.  She states she has an  appointment this coming Monday with Dr. Jennelle Human.   SOCIAL HISTORY:  She finished high school.  She has been employed as a  Agricultural engineer for about 20 years.  This is her fifth marriage.  She has  been in this marriage for seven years and she has two sons, ages 45 and 7.   FAMILY HISTORY:  She states that her mother has a history for paranoid  psychosis.   ALCOHOL/DRUG HISTORY:  She denies.   MEDICAL HISTORY:  She states she is seeing a Dr. Haskel Schroeder in [place]  and also a Dr. Daleen Squibb is prescribing for hypertension and high cholesterol.   MEDICATIONS:  She acknowledges being prescribed Xanax 1 mg p.o. t.i.d.,  Vytorin 10/40, 1 q.h.s. and Norvasc 5 mg, 1 p.o. q.d.  She goes to [facility] on  [address].  I cannot  verify her medications as the pharmacy is not yet  open.   ALLERGIES:  She states she has an allergy to PENICILLIN.   PHYSICAL EXAMINATION:  Her labs are pending.  She is an otherwise well-  developed, well-nourished, African-American female who appears around her  stated age of 39.  She reports a weight loss.  However, that was not  apparent.   MENTAL STATUS EXAM:  She is alert but sleepy.  She is well-groomed, dressed  and nourished.  Her speech has a normal rate, rhythm and tone.  Her mood was  depressed.  Eventually, she cried.  Her affect is somewhat constricted.  Her  thought processes are clear, rational and goal-oriented.  She does want to  feel better.  Her judgment and insight are poor.  Her concentration and  memory are better than she is trying to make out.  Her intelligence is at  least average.  Today, she does not have active suicidal or homicidal  ideation and I am not sure that she in fact is actually having auditory  hallucinations.   DIAGNOSES:   AXIS I:  1.  Major depression, recurrent, severe.  2.  Possible auditory hallucinations.   AXIS II:  Rule out relationship  issues.  This is her fifth marriage.   AXIS III:  1.  Hypertension.  2.  Hypercholesterolemia.  3.  Arthritis.   AXIS IV:  Moderate (from work stressors).   AXIS V:  35.   PLAN:  Admit for safety and stabilization.  Will rule out any underlying  medical illnesses.  Will get her started on an antidepressant.  Toward that  end, we will start Wellbutrin XL 150 mg p.o. q.a.m. as she does not appear  to have been tried on this before.      MD/MEDQ  D:  11/08/2004  T:  11/08/2004  Job:  [id number]

## 2011-01-16 NOTE — Discharge Summary (Signed)
[name], Veronica Baker             ACCOUNT NO.:  [id number]   MEDICAL RECORD NO.:  [id number]          PATIENT TYPE:  IPS   LOCATION:  0506                          [facility]   PHYSICIAN:  Veronica Baker, M.D. DATE OF BIRTH:  1951-01-15   DATE OF ADMISSION:  11/07/2004  DATE OF DISCHARGE:  11/13/2004                                 DISCHARGE SUMMARY   IDENTIFYING DATA:  This is a 60 year old married African-American female,  walk-in, reporting a lot of stress at work, decompensated, agitated.  Reported this evaluation makes me look stupid.  Possibly paranoid.  Reporting not sure if she is hearing voices or if they are her own thoughts.  Had no commands.  Had taken extra pills but now felt this was the wrong  thing to do and did not feel like she needed to be hospitalized due to this.  There had been questionable confusion, questionable depressive symptoms  although, at times, the patient appeared to be relatively cognitively intact  and denying acute dangerous ideation.  The patient had been at [facility]  inpatient in 1994 for suicidal ideation and then 10 years stable.  Had been  followed by Dr. Jennelle Baker in 2004 to 2005.  Tried a variety of medications but  stopped them.  Husband kept calling her crazy.  Had an appointment coming  up with Dr. Jennelle Baker on Monday.   MEDICATIONS:  Xanax, Vytorin, Norvasc.  [facility] on [address].   ALLERGIES:  PENICILLIN.   PHYSICAL EXAMINATION:  Physical and neurologic exam within normal limits.   LABORATORY DATA:  Routine admission labs within normal limits.   MENTAL STATUS EXAM:  Sleepy, well-groomed.  Speech within normal limits  eventually.  Tearful.  Affect constricted.  Thought processes goal directed.  Insight poor.  Denying dangerous ideation or acute psychotic symptoms.   ADMISSION DIAGNOSES:   AXIS I:  1.  Major depressive disorder, recurrent, severe, possibly with a history of      psychotic features, questionable psychotic  features at this time.  2.  Possible adjustment disorder as well.   AXIS II:  Fifth marriage.   AXIS III:  1.  Hypertension.  2.  Hypercholesterolemia.  3.  Arthritis.   AXIS IV:  Moderate stressors.   AXIS V:  35/55.   HOSPITAL COURSE:  The patient was admitted and ordered routine p.r.n.  medications and underwent further monitoring.  Was elevated for an  underlying medical condition and evaluated and monitored regarding her  safety.  The patient was resumed on medications and further collateral  information was obtained from family.   CONDITION ON DISCHARGE:  The patient was discharged in improved condition.  Did not appear in need for inpatient treatment but needed closer outpatient  follow-up.  The patient was given medication education.   DISCHARGE MEDICATIONS:  1.  Norvasc.  2.  Wellbutrin.  3.  Ambien.  4.  Lamictal.  5.  Requip.  6.  Zoloft.  7.  Risperdal.  8.  Xanax.  9.  Ultram.   FOLLOW UP:  To continue these medications until follow-up with Dr. Jennelle Baker  on  March 17th at 10:45 a.m. and call for reassessment and possible intensive  outpatient if needed.  Discharged in improved condition with no safety  issues, no dangerous ideation or acute psychotic symptoms and was alert and  oriented.      JEM/MEDQ  D:  12/18/2004  T:  12/18/2004  Job:  [id number]

## 2011-01-16 NOTE — Op Note (Signed)
Veronica Baker, Veronica Baker             ACCOUNT NO.:  [id number]   MEDICAL RECORD NO.:  [id number]          PATIENT TYPE:  AMB   LOCATION:  SDC                           FACILITY:  [facility]   PHYSICIAN:  Duke Salvia. Marcelle Overlie, M.D.DATE OF BIRTH:  11-21-1950   DATE OF PROCEDURE:  05/22/2005  DATE OF DISCHARGE:                                 OPERATIVE REPORT   PREOPERATIVE DIAGNOSIS:  Pelvic pain, left adnexal cyst.   POSTOPERATIVE DIAGNOSIS:  Extensive bilateral adnexal adhesions.   PROCEDURE:  Diagnostic laparoscopy, open followed by Pfannenstiel laparotomy  with lysis of extensive adhesions, BSO.   SURGEON:  Duke Salvia. Marcelle Overlie, M.D.   ANESTHESIA:  General endotracheal.   COMPLICATIONS:  None.   DRAINS:  Foley catheter.   BLOOD LOSS:  150 mL.   SPECIMENS REMOVED:  Bilateral tubes and ovaries.   PROCEDURE AND FINDINGS:  The patient was taken to the operating room and  after an adequate level of general endotracheal anesthesia was obtained with  the patient's legs in stirrups, the abdomen, perineum and vagina were  prepped and draped in the usual manner for laparoscopy. The bladder was  drained. Attention directed to the abdomen where 2 cm subumbilical incision  was made. This was carried down through the fascia in an open technique. The  peritoneum was identified and entered without difficulty and the open  cannula was positioned and a pneumoperitoneum was created at that point. The  good view of the pelvis could be obtained with the patient in Trendelenburg.  Three fingerbreadths above the symphysis in the midline. A 5 mm trocar were  inserted without difficulty. After manipulation there the cyst on the left  ovary could be visualized but there were extensive adhesions, a loop of  small bowel stuck down to the area of the right ovary precluding good  visualization to remove laparoscopically. Decision made to proceed with  laparotomy.   Foley catheter was positioned. The area of old  Pfannenstiel incision was  incised, this area was fairly well healed and was carried down to the fascia  which was moderately scarred. This was incised transversely. Rectus muscles  were divided and the peritoneum was entered without difficulty. Lenox Ahr retractor was positioned. Bowels packed superiorly out of the  field. Starting on the left the cyst actually appeared to the peritubal  cyst. The ovary was adherent down into the pelvic sidewall, with sharp and  blunt dissection could be freed up. Once this was completed, the course of  the ureter was noted to be well below and the left IP ligament was clamped,  divided and free tied followed by suture ligature of 0 Vicryl. The area  where the ovary had been adherent to left pelvic sidewall was inspected and  noted to be hemostatic. A loop of small bowel on the right was dissected  with careful sharp and blunt dissection. Once this was completed, the bowel  serosa was inspected and noted to be intact. This was packed further out of  the field. A small atrophic right ovary was then grasped and the right IP  ligament was clamped,  divided and the specimen removed, tied with a free tie  of 0 Vicryl. The course the right ureter was well below. The pelvis was  irrigated with saline. All operative sites were inspected and noted to be  hemostatic. Prior to closure sponge, needle, and instrument counts were  reported correct x2. The peritoneum could not be readily closed due to prior  scarring and separation. The rectus muscles reapproximated with 3-0 Vicryl  interrupted sutures. Fascia closed from laterally to midline on either side  with 0 PDS suture. The subfascial and subcutaneous On-Q pain catheter were  positioned prior to closure. Subcutaneous fat was hemostatic. Clips and  Steri-Strips used on skin. She tolerated this well and went to recovery room  in good condition.      Richard M. Marcelle Overlie, M.D.  Electronically  Signed     [facility]/MEDQ  D:  05/22/2005  T:  05/22/2005  Job:  [id number]

## 2011-02-11 ENCOUNTER — Ambulatory Visit: Payer: 59 | Attending: Orthopedic Surgery | Admitting: Physical Therapy

## 2011-02-11 DIAGNOSIS — R5381 Other malaise: Secondary | ICD-10-CM | POA: Insufficient documentation

## 2011-02-11 DIAGNOSIS — M25519 Pain in unspecified shoulder: Secondary | ICD-10-CM | POA: Insufficient documentation

## 2011-02-11 DIAGNOSIS — IMO0001 Reserved for inherently not codable concepts without codable children: Secondary | ICD-10-CM | POA: Insufficient documentation

## 2011-02-11 DIAGNOSIS — M25619 Stiffness of unspecified shoulder, not elsewhere classified: Secondary | ICD-10-CM | POA: Insufficient documentation

## 2011-02-11 DIAGNOSIS — R293 Abnormal posture: Secondary | ICD-10-CM | POA: Insufficient documentation

## 2011-02-16 ENCOUNTER — Ambulatory Visit: Payer: 59 | Admitting: Physical Therapy

## 2011-02-18 ENCOUNTER — Ambulatory Visit: Payer: 59 | Admitting: Physical Therapy

## 2011-02-24 ENCOUNTER — Ambulatory Visit: Payer: 59 | Admitting: Physical Therapy

## 2011-02-26 ENCOUNTER — Ambulatory Visit: Payer: 59 | Admitting: Physical Therapy

## 2011-05-06 ENCOUNTER — Other Ambulatory Visit: Payer: Self-pay | Admitting: Obstetrics and Gynecology

## 2011-05-06 DIAGNOSIS — R928 Other abnormal and inconclusive findings on diagnostic imaging of breast: Secondary | ICD-10-CM

## 2011-05-11 ENCOUNTER — Ambulatory Visit
Admission: RE | Admit: 2011-05-11 | Discharge: 2011-05-11 | Disposition: A | Discharge status: Home / Self Care | Payer: 59 | Source: Ambulatory Visit | Attending: Obstetrics and Gynecology | Admitting: Obstetrics and Gynecology

## 2011-05-11 DIAGNOSIS — R928 Other abnormal and inconclusive findings on diagnostic imaging of breast: Secondary | ICD-10-CM

## 2011-06-01 LAB — URINALYSIS, ROUTINE W REFLEX MICROSCOPIC
Glucose, UA: NEGATIVE
Hgb urine dipstick: NEGATIVE
pH: 5.5

## 2011-06-01 LAB — POCT I-STAT, CHEM 8
BUN: 4 — ABNORMAL LOW
Chloride: 105
Creatinine, Ser: 1.1
Sodium: 140

## 2011-06-10 LAB — I-STAT 8, (EC8 V) (CONVERTED LAB)
Acid-Base Excess: 1
Bicarbonate: 26.8 — ABNORMAL HIGH
HCT: 39
Hemoglobin: 13.3
Operator id: 196461
Potassium: 3.7
Sodium: 137
TCO2: 28

## 2011-06-10 LAB — URINALYSIS, ROUTINE W REFLEX MICROSCOPIC
Bilirubin Urine: NEGATIVE
Glucose, UA: NEGATIVE
Hgb urine dipstick: NEGATIVE
Ketones, ur: NEGATIVE
pH: 6

## 2011-06-10 LAB — HEPATIC FUNCTION PANEL
Bilirubin, Direct: <0.1
Total Bilirubin: 0.5

## 2011-06-10 LAB — CBC
HCT: 34.6 — ABNORMAL LOW
Hemoglobin: 11.4 — ABNORMAL LOW
MCV: 81.9
RBC: 4.23
WBC: 8.2

## 2011-06-10 LAB — DIFFERENTIAL
Eosinophils Relative: 2
Lymphocytes Relative: 27
Lymphs Abs: 2.2
Monocytes Absolute: 0.8 — ABNORMAL HIGH
Monocytes Relative: 10

## 2011-07-18 ENCOUNTER — Emergency Department (HOSPITAL_COMMUNITY)
Admission: EM | Admit: 2011-07-18 | Discharge: 2011-07-18 | Disposition: A | Discharge status: Home / Self Care | Payer: 59 | Attending: Emergency Medicine | Admitting: Emergency Medicine

## 2011-07-18 ENCOUNTER — Emergency Department (HOSPITAL_COMMUNITY)
Admission: EM | Admit: 2011-07-18 | Discharge: 2011-07-18 | Payer: 59 | Source: Home / Self Care | Attending: Emergency Medicine | Admitting: Emergency Medicine

## 2011-07-18 ENCOUNTER — Emergency Department (INDEPENDENT_AMBULATORY_CARE_PROVIDER_SITE_OTHER): Payer: 59

## 2011-07-18 ENCOUNTER — Encounter (HOSPITAL_COMMUNITY): Payer: Self-pay | Admitting: Emergency Medicine

## 2011-07-18 ENCOUNTER — Emergency Department (HOSPITAL_COMMUNITY): Payer: 59

## 2011-07-18 DIAGNOSIS — K810 Acute cholecystitis: Secondary | ICD-10-CM

## 2011-07-18 DIAGNOSIS — R109 Unspecified abdominal pain: Secondary | ICD-10-CM | POA: Insufficient documentation

## 2011-07-18 HISTORY — DX: Essential (primary) hypertension: I10

## 2011-07-18 LAB — DIFFERENTIAL
Basophils Relative: 0 % (ref 0–1)
Monocytes Absolute: 0.5 10*3/uL (ref 0.1–1.0)
Monocytes Relative: 6 % (ref 3–12)
Neutro Abs: 4.2 10*3/uL (ref 1.7–7.7)

## 2011-07-18 LAB — CBC
HCT: 36.6 % (ref 36.0–46.0)
Hemoglobin: 12 g/dL (ref 12.0–15.0)
MCH: 27.7 pg (ref 26.0–34.0)
MCHC: 32.8 g/dL (ref 30.0–36.0)
MCV: 84.5 fL (ref 78.0–100.0)

## 2011-07-18 LAB — COMPREHENSIVE METABOLIC PANEL
Alkaline Phosphatase: 108 U/L (ref 39–117)
BUN: 10 mg/dL (ref 6–23)
GFR calc Af Amer: 81 mL/min — ABNORMAL LOW (ref >90)
Glucose, Bld: 100 mg/dL — ABNORMAL HIGH (ref 70–99)
Potassium: 3.7 mEq/L (ref 3.5–5.1)
Total Bilirubin: 0.3 mg/dL (ref 0.3–1.2)
Total Protein: 8.1 g/dL (ref 6.0–8.3)

## 2011-07-18 MED ORDER — HYDROCODONE-ACETAMINOPHEN 5-325 MG PO TABS: 1.0000 | ORAL_TABLET | Freq: Four times a day (QID) | ORAL | Status: AC | PRN

## 2011-07-18 MED ORDER — HYDROMORPHONE HCL PF 1 MG/ML IJ SOLN
1.0000 mg | Freq: Once | INTRAMUSCULAR | Status: AC
  Administered 2011-07-18: 1 mg via INTRAVENOUS
  Filled 2011-07-18: qty 1

## 2011-07-18 MED ORDER — SODIUM CHLORIDE 0.9 % IV BOLUS (SEPSIS)
1000.0000 mL | Freq: Once | INTRAVENOUS | Status: AC
  Administered 2011-07-18: 1000 mL via INTRAVENOUS

## 2011-07-18 MED ORDER — ONDANSETRON HCL 4 MG PO TABS: 4.0000 mg | ORAL_TABLET | Freq: Four times a day (QID) | ORAL | Status: AC

## 2011-07-18 MED ORDER — ONDANSETRON HCL 4 MG/2ML IJ SOLN
4.0000 mg | Freq: Once | INTRAMUSCULAR | Status: AC
  Administered 2011-07-18: 4 mg via INTRAVENOUS
  Filled 2011-07-18: qty 2

## 2011-07-18 NOTE — ED Provider Notes (Signed)
2History     CSN: [id number] Arrival date & time: 07/18/2011 10:40 AM   First MD Initiated Contact with Patient 07/18/11 1003      Chief Complaint  Patient presents with  . Abdominal Pain    Abd pain since Wed    (Consider location/radiation/quality/duration/timing/severity/associated sxs/prior treatment) HPI Comments: She has had a four-day history of pain in the epigastrium and periumbilical area which radiates through to the back that is worsening. The pain is constant and severe, 9/10 at its worst and now is a 7/10. The pain is a sharp, crampy pain that is worse with eating any kind of food or even drinking water. She tried Scientist, research (medical) and [employer] without relief. She's felt nauseated, been burping, and vomited a couple times. Was no blood in the vomitus and the vomitus was not discolored greenish or yellow. She denies any fever, chills, sweats, constipation, diarrhea, urinary, or gynecological complaints. She is status post hysterectomy. She also has had an appendectomy and a history of ileus in the past.  Patient is a 60 y.o. female presenting with abdominal pain.  Abdominal Pain The primary symptoms of the illness include abdominal pain, nausea and vomiting. The primary symptoms of the illness do not include fever, shortness of breath, diarrhea or dysuria.  Symptoms associated with the illness do not include chills, constipation, urgency or frequency.    Past Medical History  Diagnosis Date  . Hypertension     Past Surgical History  Procedure Date  . Colon surgery   . Breast surgery   . Appendectomy   . Abdominal hysterectomy     History reviewed. No pertinent family history.  History  Substance Use Topics  . Smoking status: Never Smoker   . Smokeless tobacco: Not on file  . Alcohol Use: No    OB History    Grav Para Term Preterm Abortions TAB SAB Ect Mult Living                  Review of Systems  Constitutional: Negative for fever, chills, appetite  change and unexpected weight change.  Respiratory: Negative for cough, shortness of breath and wheezing.   Cardiovascular: Negative for chest pain.  Gastrointestinal: Positive for nausea, vomiting and abdominal pain. Negative for diarrhea, constipation, blood in stool, abdominal distention, anal bleeding and rectal pain.  Genitourinary: Negative for dysuria, urgency and frequency.  Skin: Negative for rash.    Allergies  Penicillins  Home Medications   Current Outpatient Rx  Name Route Sig Dispense Refill  . FUROSEMIDE 40 MG PO TABS Oral Take 40 mg by mouth 2 (two) times daily.      Marland Kitchen METOLAZONE 2.5 MG PO TABS Oral Take 2.5 mg by mouth daily. Every other day       BP 116/81  Pulse 83  Temp(Src) 97.9 F (36.6 C) (Oral)  Resp 20  SpO2 98%  Physical Exam  Nursing note and vitals reviewed. Constitutional: She appears well-developed and well-nourished. No distress.  Eyes: No scleral icterus.  Cardiovascular: Normal rate, regular rhythm and normal heart sounds.  Exam reveals no gallop and no friction rub.   No murmur heard. Pulmonary/Chest: Effort normal and breath sounds normal. No respiratory distress. She has no wheezes. She has no rales.  Abdominal: Soft. She exhibits no distension and no mass. There is no hepatosplenomegaly. There is tenderness (she has tenderness to palpation, especially in the right upper quadrant with guarding and rebound. There also is tenderness to palpation diffusely but this is  not quite as severe.she has a positive Murphy's sign and Murphy's punch). There is rebound and guarding. There is no CVA tenderness.       Bowel sounds are absent.  Skin: Skin is warm and dry. No rash noted. She is not diaphoretic.    ED Course  Procedures (including critical care time)  Labs Reviewed  COMPREHENSIVE METABOLIC PANEL - Abnormal; Notable for the following:    Glucose, Bld 100 (*)    GFR calc non Af Amer 70 (*)    GFR calc Af Amer 81 (*)    All other components  within normal limits  CBC  DIFFERENTIAL  LIPASE, BLOOD   Dg Abd Acute W/chest  07/18/2011  *RADIOLOGY REPORT*  Clinical Data: Abdominal pain.  ACUTE ABDOMEN SERIES (ABDOMEN 2 VIEW & CHEST 1 VIEW)  Comparison: Chest and two views abdomen 10/22/2010 and CT abdomen and pelvis 05/23/2009.  Findings: Single view of the chest demonstrates clear lungs and normal heart size.  No pneumothorax or pleural effusion.  Two views of the abdomen show no free intraperitoneal air.  The bowel gas pattern is unremarkable.  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  IMPRESSION: No acute finding chest or abdomen.  Original Report Authenticated By: Bernadene Bell. D'ALESSIO, M.D.     1. Acute cholecystitis       MDM  She has tenderness to palpation in the right upper quadrant, positive Murphy's sign and Murphy's punch, guarding, and rebound. All her labs are negative, but I'm still concerned about cholecystitis and ascending her by shuttle to the emergency room.        Roque Lias, MD 07/18/11 1249

## 2011-07-18 NOTE — ED Provider Notes (Signed)
History     CSN: [id number] Arrival date & time: 07/18/2011  1:17 PM   First MD Initiated Contact with Patient 07/18/11 1343      Chief Complaint  Patient presents with  . Abdominal Pain    Radiates to back    (Consider location/radiation/quality/duration/timing/severity/associated sxs/prior treatment) HPI Patient reports several days of worsening abdominal pain.  She's reports it is around her umbilicus and it occasionally radiates through to the back.  The pain is constant severe at its worst it was a 9/10.  She denies eating.  She's vomited several times his vomit has been nonbloody nonbilious.  She denies diarrhea.  She denies fever or chills.  She denies constipation she denies urinary symptoms.  Nothing worsens her symptoms.  Nothing improves her symptoms.  She's had no relief with TUMS and other current medicines.   Past Medical History  Diagnosis Date  . Hypertension     Past Surgical History  Procedure Date  . Breast surgery   . Appendectomy   . Abdominal hysterectomy   . Colon surgery     colonscopy    History reviewed. No pertinent family history.  History  Substance Use Topics  . Smoking status: Never Smoker   . Smokeless tobacco: Not on file  . Alcohol Use: No    OB History    Grav Para Term Preterm Abortions TAB SAB Ect Mult Living                  Review of Systems  All other systems reviewed and are negative.    Allergies  Penicillins  Home Medications   Current Outpatient Rx  Name Route Sig Dispense Refill  . FUROSEMIDE 40 MG PO TABS Oral Take 40 mg by mouth 2 (two) times daily.      Marland Kitchen METOLAZONE 2.5 MG PO TABS Oral Take 2.5 mg by mouth daily. Every other day       BP 129/80  Pulse 88  Temp(Src) 98.4 F (36.9 C) (Oral)  Resp 19  SpO2 100%  Physical Exam  Nursing note and vitals reviewed. Constitutional: She is oriented to person, place, and time. She appears well-developed and well-nourished. No distress.  HENT:  Head:  Normocephalic and atraumatic.  Eyes: EOM are normal.  Neck: Normal range of motion.  Cardiovascular: Normal rate, regular rhythm and normal heart sounds.   Pulmonary/Chest: Effort normal and breath sounds normal.  Abdominal: Soft. She exhibits no distension.       Mild epigastric and right upper quadrant tenderness without guarding or rebound.  She also has tenderness around her umbilicus.  Musculoskeletal: Normal range of motion.  Neurological: She is alert and oriented to person, place, and time.  Skin: Skin is warm and dry.  Psychiatric: She has a normal mood and affect. Judgment normal.    ED Course  Procedures (including critical care time)  Labs Reviewed - No data to display US Abdomen Complete  07/18/2011  *RADIOLOGY REPORT*  Clinical Data:  Abdominal pain  COMPLETE ABDOMINAL ULTRASOUND  Comparison:  [facility] ultrasound dated 07/01/2009  Findings:  Gallbladder:  No gallstones, gallbladder wall thickening, or pericholecystic fluid.  Negative sonographic Murphy's sign.  Common bile duct:  Measures 4 mm.  Liver:  No focal lesion identified.  Within normal limits in parenchymal echogenicity.  IVC:  Appears normal.  Pancreas:  Visualized portions are within normal limits.  Spleen:  Measures 5.6 cm.  Right Kidney:  Measures 10.1 cm.  No mass or hydronephrosis.  Left Kidney:  Measures 10.6 cm.  No mass or hydronephrosis.  Abdominal aorta:  No aneurysm identified.  IMPRESSION: Negative abdominal ultrasound.  Original Report Authenticated By: Charline Bills, M.D.   Dg Abd Acute W/chest  07/18/2011  *RADIOLOGY REPORT*  Clinical Data: Abdominal pain.  ACUTE ABDOMEN SERIES (ABDOMEN 2 VIEW & CHEST 1 VIEW)  Comparison: Chest and two views abdomen 10/22/2010 and CT abdomen and pelvis 05/23/2009.  Findings: Single view of the chest demonstrates clear lungs and normal heart size.  No pneumothorax or pleural effusion.  Two views of the abdomen show no free intraperitoneal air.  The bowel gas  pattern is unremarkable.  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  IMPRESSION: No acute finding chest or abdomen.  Original Report Authenticated By: Bernadene Bell. D'ALESSIO, M.D.     1. Abdominal pain       MDM  Concerning for possible biliary colic versus cholecystitis.  The patient has had an appendectomy therefore making this less likely.  Reviewed laboratory data from urgent care which is within normal limits.  We'll obtain ultrasound to evaluate further  Care continued in the CDU. Repeat abdominal exams without significant tenderness. Dc home with pcp followup        Lyanne Co, MD 07/18/11 2241

## 2011-07-18 NOTE — ED Notes (Signed)
Pt has had abd pain since Wednesday and took mag citrate and had relief, but started back and had ileus two years ago.

## 2011-07-18 NOTE — ED Provider Notes (Signed)
3:34 PM Patient is in CDU holding for abdominal US.  Pt c/o diffuse abdominal pain, nausea, one episode of vomiting, intermittent diarrhea and constipation.  On exam, pt is A&O, NAD, RRR, CTAB, abd obese, soft, nondistended, diffusely tender to palpation, no guarding, no rebound.  Will continue to follow.    5:32 PM  Discussed results and plan for GI follow up with patient.  Pt states she is now feeling better.  Plan is for d/c home.    Dillard Cannon [place], [place] 07/19/11 [phone]

## 2011-07-18 NOTE — ED Notes (Signed)
Pt transferred from Urgent care with c/o epigastric pain radiating to back onset Wednesday. Pt reports nausea and vomiting. Pt took laxative on Wednesday and had normal BM. This morning BM was runny.

## 2011-07-19 NOTE — ED Provider Notes (Signed)
Medical screening examination/treatment/procedure(s) were conducted as a shared visit with non-physician practitioner(s) and myself.  I personally evaluated the patient during the encounter  I was the primary provider of this patient during this ER visit. The patients care was continued in the CDU and managed in conjunction with my midlevel providers   Lyanne Co, MD 07/19/11 [phone]

## 2011-10-09 ENCOUNTER — Other Ambulatory Visit (HOSPITAL_COMMUNITY): Payer: Self-pay | Admitting: Gastroenterology

## 2011-10-16 ENCOUNTER — Encounter (HOSPITAL_COMMUNITY)
Admission: RE | Admit: 2011-10-16 | Discharge: 2011-10-16 | Disposition: A | Discharge status: Home / Self Care | Payer: 59 | Source: Ambulatory Visit | Attending: Gastroenterology | Admitting: Gastroenterology

## 2011-10-16 DIAGNOSIS — R109 Unspecified abdominal pain: Secondary | ICD-10-CM | POA: Insufficient documentation

## 2011-10-16 MED ORDER — TECHNETIUM TC 99M SULFUR COLLOID
2.0000 | Freq: Once | INTRAVENOUS | Status: AC | PRN
  Administered 2011-10-16: 2 via INTRAVENOUS

## 2011-10-25 ENCOUNTER — Emergency Department (HOSPITAL_COMMUNITY)
Admission: EM | Admit: 2011-10-25 | Discharge: 2011-10-25 | Disposition: A | Discharge status: Home / Self Care | Payer: 59 | Source: Home / Self Care | Attending: Emergency Medicine | Admitting: Emergency Medicine

## 2011-10-25 ENCOUNTER — Encounter (HOSPITAL_COMMUNITY): Payer: Self-pay | Admitting: Emergency Medicine

## 2011-10-25 DIAGNOSIS — B9789 Other viral agents as the cause of diseases classified elsewhere: Secondary | ICD-10-CM

## 2011-10-25 DIAGNOSIS — B349 Viral infection, unspecified: Secondary | ICD-10-CM

## 2011-10-25 HISTORY — DX: Depression, unspecified: F32.A

## 2011-10-25 HISTORY — DX: Major depressive disorder, single episode, unspecified: F32.9

## 2011-10-25 MED ORDER — ALBUTEROL SULFATE HFA 108 (90 BASE) MCG/ACT IN AERS: 1.0000 | INHALATION_SPRAY | Freq: Four times a day (QID) | RESPIRATORY_TRACT | Status: DC | PRN

## 2011-10-25 MED ORDER — HYDROCODONE-ACETAMINOPHEN 7.5-500 MG/15ML PO SOLN: 5.0000 mL | Freq: Four times a day (QID) | ORAL | Status: AC | PRN

## 2011-10-25 MED ORDER — NAPROXEN 500 MG PO TABS: 500.0000 mg | ORAL_TABLET | Freq: Two times a day (BID) | ORAL | Status: DC

## 2011-10-25 MED ORDER — GUAIFENESIN ER 600 MG PO TB12: 1200.0000 mg | ORAL_TABLET | Freq: Two times a day (BID) | ORAL | Status: DC

## 2011-10-25 NOTE — Discharge Instructions (Signed)
Take the medication as written. Take 1 gram of tylenol with the motrin up to 4 times a day as needed for pain and fever. This is an effective combination. Drink extra fluids. Start taking the mucinex to keep the mucus secretions thin. Use a neti pot or the NeilMed sinus rinse as often as you want to to reduce nasal congestion. Follow the directions on the box. Return if you get worse, have a  fever >100.4, or for any concerns.

## 2011-10-25 NOTE — ED Provider Notes (Signed)
History     CSN: [id number]  Arrival date & time 10/25/11  1016   First MD Initiated Contact with Patient 10/25/11 1023      Chief Complaint  Patient presents with  . Cough    (Consider location/radiation/quality/duration/timing/severity/associated sxs/prior treatment) HPI Comments: Pt with rhinorrhea, postnasal drip, ST, nonproductive cough, fatigue, bodyaches starting yesterday.  Unable to sleep at night secondary to coughing. States that she has headaches and shortness of breath from all the coughing. No photophobia, visual changes, stiff neck. No other shortness of breath, dyspnea on exertion. No fevers, wheeze, abd pain, rash, N/V. Slightly decreased appetite but is tolerating po.  Patient is a Licensed conveyancer at [employer], and has multiple sick contacts.   ROS as noted in HPI. All other ROS negative.       Patient is a 61 y.o. female presenting with cough. The history is provided by the patient. No language interpreter was used.  Cough This is a new problem. The current episode started yesterday. The problem has been gradually worsening. The cough is non-productive. There has been no fever. Associated symptoms include ear congestion, ear pain, headaches, rhinorrhea, sore throat, myalgias and shortness of breath. Pertinent negatives include no chest pain, no chills and no wheezing. She has tried nothing for the symptoms. The treatment provided no relief. She is not a smoker. Her past medical history does not include bronchitis, pneumonia, COPD or asthma.    Past Medical History  Diagnosis Date  . Hypertension   . Depression     Past Surgical History  Procedure Date  . Breast surgery   . Appendectomy   . Abdominal hysterectomy   . Colon surgery     colonscopy    History reviewed. No pertinent family history.  History  Substance Use Topics  . Smoking status: Never Smoker   . Smokeless tobacco: Not on file  . Alcohol Use: No    OB History    Grav Para Term Preterm  Abortions TAB SAB Ect Mult Living                  Review of Systems  Constitutional: Negative for chills.  HENT: Positive for ear pain, sore throat and rhinorrhea.   Respiratory: Positive for cough and shortness of breath. Negative for wheezing.   Cardiovascular: Negative for chest pain.  Musculoskeletal: Positive for myalgias.  Neurological: Positive for headaches.    Allergies  Penicillins  Home Medications   Current Outpatient Rx  Name Route Sig Dispense Refill  . POLYETHYLENE GLYCOL 3350 PO PACK Oral Take 17 g by mouth daily.    . ALBUTEROL SULFATE HFA 108 (90 BASE) MCG/ACT IN AERS Inhalation Inhale 1-2 puffs into the lungs every 6 (six) hours as needed for wheezing. 1 Inhaler 0  . FUROSEMIDE 40 MG PO TABS Oral Take 40 mg by mouth 2 (two) times daily.      . GUAIFENESIN ER 600 MG PO TB12 Oral Take 2 tablets (1,200 mg total) by mouth 2 (two) times daily. 28 tablet 0  . HYDROCODONE-ACETAMINOPHEN 7.5-500 MG/15ML PO SOLN Oral Take 5 mLs by mouth every 6 (six) hours as needed for pain. 120 mL 0  . METOLAZONE 2.5 MG PO TABS Oral Take 2.5 mg by mouth daily. Every other day     . NAPROXEN 500 MG PO TABS Oral Take 1 tablet (500 mg total) by mouth 2 (two) times daily with a meal. 20 tablet 0    BP 148/83  Pulse 93  Temp(Src) 98.6 F (37 C) (Oral)  Resp 20  SpO2 98%  Physical Exam  Nursing note and vitals reviewed. Constitutional: She is oriented to person, place, and time. She appears well-developed and well-nourished.  HENT:  Head: Normocephalic and atraumatic.  Right Ear: Tympanic membrane and ear canal normal.  Left Ear: Tympanic membrane and ear canal normal.  Nose: Mucosal edema and rhinorrhea present. No epistaxis.  Mouth/Throat: Uvula is midline and mucous membranes are normal. Posterior oropharyngeal erythema present. No oropharyngeal exudate.       (-) frontal, maxillary sinus tenderness  Eyes: Conjunctivae and EOM are normal.  Neck: Normal range of motion. Neck  supple.  Cardiovascular: Normal rate, regular rhythm and normal heart sounds.   Pulmonary/Chest: Effort normal and breath sounds normal. She has no wheezes.  Abdominal: Bowel sounds are normal. She exhibits no distension. There is no guarding.  Musculoskeletal: Normal range of motion.  Lymphadenopathy:    She has cervical adenopathy.  Neurological: She is alert and oriented to person, place, and time.  Skin: Skin is warm and dry. No rash noted.  Psychiatric: She has a normal mood and affect. Her behavior is normal. Judgment and thought content normal.    ED Course  Procedures (including critical care time)  Labs Reviewed - No data to display No results found.   1. Viral syndrome       MDM  Pt appears to be in NAD. VSS. Pt non-toxic appearing. No evidence of pharyngitis or OM. No evidence of neck stiffness or other sx to support meningitis. No evidence of dehydration. Abd S/NT/ND without peritoneal sx. Doubt intraabdominal process. No evidence of PNA. Pt able to tolerate PO. Pt with viral syndrome. Advised increase fluids, rest. Will treat symptomatically and have pt f/u with PCP PRN.   Luiz Blare, MD 10/25/11 1128

## 2011-10-25 NOTE — ED Notes (Signed)
Pt started feeling poorly yesterday morning. She has headache, dyspnea, non-productive cough and sore throat.

## 2011-12-29 ENCOUNTER — Encounter (HOSPITAL_COMMUNITY): Payer: Self-pay | Admitting: *Deleted

## 2011-12-29 ENCOUNTER — Emergency Department (HOSPITAL_COMMUNITY)
Admission: EM | Admit: 2011-12-29 | Discharge: 2011-12-29 | Disposition: A | Discharge status: Home / Self Care | Payer: 59 | Source: Home / Self Care | Attending: Family Medicine | Admitting: Family Medicine

## 2011-12-29 DIAGNOSIS — M130 Polyarthritis, unspecified: Secondary | ICD-10-CM

## 2011-12-29 DIAGNOSIS — M159 Polyosteoarthritis, unspecified: Secondary | ICD-10-CM

## 2011-12-29 HISTORY — DX: Fibromyalgia: M79.7

## 2011-12-29 HISTORY — DX: Unspecified osteoarthritis, unspecified site: M19.90

## 2011-12-29 MED ORDER — TRIAMCINOLONE ACETONIDE 40 MG/ML IJ SUSP
INTRAMUSCULAR | Status: AC
  Filled 2011-12-29: qty 5

## 2011-12-29 MED ORDER — MELOXICAM 7.5 MG PO TABS: 7.5000 mg | ORAL_TABLET | Freq: Two times a day (BID) | ORAL | Status: DC

## 2011-12-29 MED ORDER — METHYLPREDNISOLONE ACETATE 40 MG/ML IJ SUSP
80.0000 mg | Freq: Once | INTRAMUSCULAR | Status: AC
  Administered 2011-12-29: 80 mg via INTRAMUSCULAR

## 2011-12-29 MED ORDER — METHYLPREDNISOLONE ACETATE 80 MG/ML IJ SUSP
INTRAMUSCULAR | Status: AC
  Filled 2011-12-29: qty 1

## 2011-12-29 MED ORDER — TRIAMCINOLONE ACETONIDE 40 MG/ML IJ SUSP
40.0000 mg | Freq: Once | INTRAMUSCULAR | Status: AC
  Administered 2011-12-29: 40 mg via INTRAMUSCULAR

## 2011-12-29 NOTE — ED Provider Notes (Signed)
History     CSN: [id number]  Arrival date & time 12/29/11  4098   First MD Initiated Contact with Patient 12/29/11 1018      Chief Complaint  Patient presents with  . Arm Pain    (Consider location/radiation/quality/duration/timing/severity/associated sxs/prior treatment) Patient is a 61 y.o. female presenting with arm pain. The history is provided by the patient.  Arm Pain This is a chronic problem. The current episode started 1 to 2 hours ago. The problem has not changed since onset.Associated symptoms comments: Chronic arthritis, flare-up today, no maint meds, seen by dr Corliss Skains in past for eval..    Past Medical History  Diagnosis Date  . Hypertension   . Depression   . Arthritis   . Fibromyalgia     Past Surgical History  Procedure Date  . Breast surgery   . Appendectomy   . Abdominal hysterectomy   . Colon surgery     colonscopy    Family History  Problem Relation Age of Onset  . Hypertension Brother   . Coronary artery disease Brother   . Asthma Brother     History  Substance Use Topics  . Smoking status: Never Smoker   . Smokeless tobacco: Not on file  . Alcohol Use: No    OB History    Grav Para Term Preterm Abortions TAB SAB Ect Mult Living                  Review of Systems  Constitutional: Negative.   HENT: Negative.   Gastrointestinal: Negative.   Genitourinary: Negative.   Musculoskeletal: Positive for joint swelling and arthralgias.  Skin: Negative.     Allergies  Penicillins  Home Medications   Current Outpatient Rx  Name Route Sig Dispense Refill  . ALBUTEROL SULFATE HFA 108 (90 BASE) MCG/ACT IN AERS Inhalation Inhale 1-2 puffs into the lungs every 6 (six) hours as needed for wheezing. 1 Inhaler 0  . FUROSEMIDE 40 MG PO TABS Oral Take 40 mg by mouth 2 (two) times daily.      Marland Kitchen METOLAZONE 2.5 MG PO TABS Oral Take 2.5 mg by mouth daily. Every other day     . NAPROXEN 500 MG PO TABS Oral Take 1 tablet (500 mg total) by mouth  2 (two) times daily with a meal. 20 tablet 0  . POLYETHYLENE GLYCOL 3350 PO PACK Oral Take 17 g by mouth daily.    . GUAIFENESIN ER 600 MG PO TB12 Oral Take 2 tablets (1,200 mg total) by mouth 2 (two) times daily. 28 tablet 0  . MELOXICAM 7.5 MG PO TABS Oral Take 1 tablet (7.5 mg total) by mouth 2 (two) times daily. 60 tablet 1    BP 134/80  Pulse 70  Temp(Src) 97.9 F (36.6 C) (Oral)  Resp 18  SpO2 100%  Physical Exam  Nursing note and vitals reviewed. Constitutional: She is oriented to person, place, and time. She appears well-developed and well-nourished.  HENT:  Head: Normocephalic.  Neck: Normal range of motion. Neck supple.  Musculoskeletal: She exhibits tenderness.       Arms: Lymphadenopathy:    She has no cervical adenopathy.  Neurological: She is alert and oriented to person, place, and time.  Skin: Skin is warm and dry.    ED Course  Procedures (including critical care time)  Labs Reviewed - No data to display No results found.   1. Degenerative polyarthritis       MDM  Linna Hoff, MD 12/29/11 1037

## 2011-12-29 NOTE — Discharge Instructions (Signed)
Use medicine as prescribed, see your doctor if further problems. °

## 2011-12-29 NOTE — ED Notes (Signed)
Pt reports a h/o arthritis.   About an hour ago she started having pain in the right arm, shoulder and leg.

## 2012-03-10 ENCOUNTER — Other Ambulatory Visit (HOSPITAL_COMMUNITY): Payer: Self-pay | Admitting: Family Medicine

## 2012-03-10 ENCOUNTER — Ambulatory Visit (HOSPITAL_COMMUNITY): Payer: 59 | Attending: Internal Medicine | Admitting: Radiology

## 2012-03-10 DIAGNOSIS — R609 Edema, unspecified: Secondary | ICD-10-CM

## 2012-03-10 DIAGNOSIS — R0989 Other specified symptoms and signs involving the circulatory and respiratory systems: Secondary | ICD-10-CM | POA: Insufficient documentation

## 2012-03-10 DIAGNOSIS — R0609 Other forms of dyspnea: Secondary | ICD-10-CM | POA: Insufficient documentation

## 2012-03-10 DIAGNOSIS — I10 Essential (primary) hypertension: Secondary | ICD-10-CM | POA: Insufficient documentation

## 2012-03-10 DIAGNOSIS — E669 Obesity, unspecified: Secondary | ICD-10-CM | POA: Insufficient documentation

## 2012-03-10 NOTE — Progress Notes (Signed)
Echocardiogram performed.  

## 2012-03-11 ENCOUNTER — Encounter (HOSPITAL_COMMUNITY): Payer: Self-pay | Admitting: Family Medicine

## 2012-08-02 ENCOUNTER — Ambulatory Visit (HOSPITAL_COMMUNITY)
Admission: RE | Admit: 2012-08-02 | Discharge: 2012-08-02 | Disposition: A | Discharge status: Home / Self Care | Payer: 59 | Source: Ambulatory Visit | Attending: Family Medicine | Admitting: Family Medicine

## 2012-08-02 ENCOUNTER — Other Ambulatory Visit (HOSPITAL_COMMUNITY): Payer: Self-pay | Admitting: *Deleted

## 2012-08-02 DIAGNOSIS — R0602 Shortness of breath: Secondary | ICD-10-CM | POA: Insufficient documentation

## 2012-08-02 DIAGNOSIS — R609 Edema, unspecified: Secondary | ICD-10-CM | POA: Insufficient documentation

## 2012-08-11 ENCOUNTER — Other Ambulatory Visit (HOSPITAL_COMMUNITY): Payer: Self-pay | Admitting: Obstetrics and Gynecology

## 2012-08-11 DIAGNOSIS — R102 Pelvic and perineal pain: Secondary | ICD-10-CM

## 2012-08-15 ENCOUNTER — Ambulatory Visit (HOSPITAL_COMMUNITY): Payer: 59

## 2012-08-16 ENCOUNTER — Ambulatory Visit (HOSPITAL_COMMUNITY)
Admission: RE | Admit: 2012-08-16 | Discharge: 2012-08-16 | Disposition: A | Discharge status: Home / Self Care | Payer: 59 | Source: Ambulatory Visit | Attending: Obstetrics and Gynecology | Admitting: Obstetrics and Gynecology

## 2012-08-16 DIAGNOSIS — N949 Unspecified condition associated with female genital organs and menstrual cycle: Secondary | ICD-10-CM | POA: Insufficient documentation

## 2012-08-16 DIAGNOSIS — Z90710 Acquired absence of both cervix and uterus: Secondary | ICD-10-CM | POA: Insufficient documentation

## 2012-08-16 DIAGNOSIS — R109 Unspecified abdominal pain: Secondary | ICD-10-CM | POA: Insufficient documentation

## 2012-08-16 DIAGNOSIS — R102 Pelvic and perineal pain: Secondary | ICD-10-CM

## 2012-08-16 MED ORDER — IOHEXOL 300 MG/ML  SOLN: 100.0000 mL | Freq: Once | INTRAMUSCULAR | Status: AC | PRN

## 2012-08-29 ENCOUNTER — Other Ambulatory Visit (HOSPITAL_COMMUNITY): Payer: Self-pay | Admitting: Cardiology

## 2012-08-29 DIAGNOSIS — R0602 Shortness of breath: Secondary | ICD-10-CM

## 2012-09-06 ENCOUNTER — Encounter (HOSPITAL_COMMUNITY)
Admission: RE | Admit: 2012-09-06 | Discharge: 2012-09-06 | Disposition: A | Discharge status: Home / Self Care | Payer: 59 | Source: Ambulatory Visit | Attending: Cardiology | Admitting: Cardiology

## 2012-09-06 ENCOUNTER — Other Ambulatory Visit: Payer: Self-pay

## 2012-09-06 ENCOUNTER — Ambulatory Visit (HOSPITAL_COMMUNITY)
Admission: RE | Admit: 2012-09-06 | Discharge: 2012-09-06 | Disposition: A | Discharge status: Home / Self Care | Payer: 59 | Source: Ambulatory Visit | Attending: Family Medicine | Admitting: Family Medicine

## 2012-09-06 DIAGNOSIS — I10 Essential (primary) hypertension: Secondary | ICD-10-CM | POA: Insufficient documentation

## 2012-09-06 DIAGNOSIS — I059 Rheumatic mitral valve disease, unspecified: Secondary | ICD-10-CM | POA: Insufficient documentation

## 2012-09-06 DIAGNOSIS — R0602 Shortness of breath: Secondary | ICD-10-CM | POA: Insufficient documentation

## 2012-09-06 DIAGNOSIS — R079 Chest pain, unspecified: Secondary | ICD-10-CM | POA: Insufficient documentation

## 2012-09-06 DIAGNOSIS — I379 Nonrheumatic pulmonary valve disorder, unspecified: Secondary | ICD-10-CM | POA: Insufficient documentation

## 2012-09-06 MED ORDER — TECHNETIUM TC 99M SESTAMIBI GENERIC - CARDIOLITE
30.0000 | Freq: Once | INTRAVENOUS | Status: AC | PRN
  Administered 2012-09-06: 30 via INTRAVENOUS

## 2012-09-06 MED ORDER — TECHNETIUM TC 99M SESTAMIBI GENERIC - CARDIOLITE
10.0000 | Freq: Once | INTRAVENOUS | Status: AC | PRN
  Administered 2012-09-06: 10 via INTRAVENOUS

## 2012-09-06 NOTE — Progress Notes (Signed)
Echocardiogram 2D Echocardiogram has been performed.  Veronica Baker 09/06/2012, 12:34 PM

## 2012-09-09 ENCOUNTER — Other Ambulatory Visit: Payer: Self-pay | Admitting: Cardiology

## 2012-09-12 ENCOUNTER — Encounter: Payer: Self-pay | Admitting: Cardiology

## 2012-09-12 ENCOUNTER — Other Ambulatory Visit: Payer: Self-pay | Admitting: Cardiology

## 2012-09-12 NOTE — H&P (Signed)
Office Visit     Patient: Veronica Baker, Prentiss Provider: Armanda Magic, MD  DOB: 05/14/1951 Age: 62 Y Sex: Female Date: 09/08/2012  Phone: [phone]   Address: [address] , [place], [id number]  Pcp: Areya WHITE       Subjective:     CC:    1. TT/Stress Test F/u.        HPI:  General:  The patient presents today for evaluation of SOB and edema. She was recently seen by her primary MD for increasing LE edema and her Lasix dose was increased. Her BNP at that time was normal. She does use NSAIDs. An echo done earlier in the summer showed normal LVF. She says that her LE edema has improved. The swelling occurs more when she stands or sits too long. She says that the SOB has been occurring for about 3 months. She denies any chest pain, palpitations, dizziness or syncope. She underwent nuclear stress test which showed a moderate area of reversibility in the inferolateral wall c/w ischemia and normal LVF EF 68%..        ROS:  See HPI, A twelve system review was perfomed at today's visit. For pertinent positives and negatives see HPI.       Medical History: Arthritis, Bronchitis, Heart murmur, Anemia, Hypercholesterolemia, Possible sarcoidosis, Dr Titus Dubin, Polyclonal gamopathy, Dr. Clelia Croft, Angina, h/o depression/bipolar, previously saw Dr. Jennelle Human, was released in 2010, Edema, Hypertension.        Surgical History: hysterectomy , breast cyst removal , appendectomy , colonoscopy 05/2004 , umbilical hernia repair , bilateral oopherectomy, benign .        Family History: Father: deceased 44 yrs stabbed at 35 Mother: alive Alzheimers Paternal Grand Father: deceased Paternal Grand Mother: deceased Maternal Grand Father: deceased Maternal Grand Mother: deceased Brother 1: alive Sister 1: deceased died of sepsis Sister 2: alive 1 brother(s) , 2 sister(s) .        Social History:  General:  History of smoking  cigarettes: Never smoked no Smoking.  no Alcohol.  no  Recreational drug use.  Exercise: walking daily with her dog.  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  Marital Status: Divorced.  Children: Jairo Ben, Harvest 1975.        Medications: Lipitor 20 MG Tablet 1 tablet Once a day, Proventil HFA 108 (90 Base) MCG/ACT Aerosol Solution 2 puffs as needed every 6 hrs as needed, Hydrocodone-APAP-Dietary Prod 7.5-500mg  Miscellaneous one teaspoon every 6 hrs, MiraLax Powder 17 gm three times a day, Cyclobenzaprine HCl 10 MG Tablet 1/2-1 tablet Three times a day as needed for spasm, Sertraline HCl 50 MG Tablet 1 tablet Once a day, Risperdal 2 mg Tablet 1 tablet Once a day at dinnertime, Metolazone 2.5 MG Tablet 1 tablet every other day, Furosemide 40mg  Tablet 1-2 tablets daily, as needed for swelling, Naproxen 500 MG Tablet Delayed Release 1 tablet as needed, Lyrica 50 MG Capsule 1 capsule Once a day, Medication List reviewed and reconciled with the patient       Allergies: Penicillin V Potassium: rash, Tramadol HCl: upset stomach.       Objective:     Vitals: Wt 218, Wt change 3.2 lb, Ht 62, BMI 39.87, Pulse sitting 88, BP sitting 120/80.       Examination:        Assessment:     Assessment:  1. Abnormal cardiovascular stress test - 794.39 (Primary)  2. Benign essential HTN - 401.1  3. SOB (shortness of breath) -  786.05    Plan:     1. Abnormal cardiovascular stress test  LAB: Basic Metabolic    GLUCOSE 91 70-99 - mg/dL    BUN 7 1-61 - mg/dL    CREATININE 0.96 0.45-4.09 - mg/dl    eGFR (NON-AFRICAN AMERICAN) 62 >60 - calc    eGFR (AFRICAN AMERICAN) 74 >60 - calc    SODIUM 140 136-145 - mmol/L    POTASSIUM 3.3 3.5-5.5 - mmol/L L   CHLORIDE 98 98-107 - mmol/L    C02 33 22-32 - mmol/L H   ANION GAP 11.9 6.0-20.0 - mmol/L    CALCIUM 9.0 8.6-10.3 - mg/dL     Cincere Zorn M 81/19/1478 02:49:58 PM > low potassium - please have patient take Kdur 2 tablets today then 1 tablet daily and recheck Monday 1/13 Corson,Danielle  09/08/2012 04:29:30 PM > Pt is aware. New meds sent in for pt. Labs set up for 1/13.    LAB: CBC with Diff Stable    WBC 6.2 4.0-11.0 - K/ul    RBC 4.33 4.20-5.40 - M/uL    HGB 11.4 12.0-16.0 - g/dL L   HCT 29.5 62.1-30.8 - % L   MCH 26.2 27.0-33.0 - pg L   MPV 10.0 7.5-10.7 - fL    MCV 82.5 81.0-99.0 - fL    MCHC 31.8 32.0-36.0 - g/dL L   RDW 65.7 84.6-96.2 - %    NRBC# 0.00 -    PLT 172 150-400 - K/uL    NEUT % 48.4 43.3-71.9 - %    NRBC% 0.10 - %    LYMPH% 35.2 16.8-43.5 - %    MONO % 11.2 4.6-12.4 - %    EOS % 4.5 0.0-7.8 - %    BASO % 0.7 0.0-1.0 - %    NEUT # 3.0 1.9-7.2 - K/uL    LYMPH# 2.20 1.10-2.70 - K/uL    MONO # 0.7 0.3-0.8 - K/uL    EOS # 0.3 0.0-0.6 - K/uL    BASO # 0.0 0.0-0.1 - K/uL     Greysen Devino M 09/08/2012 11:21:58 AM > Corson,Danielle 09/08/2012 12:07:20 PM > FOR CATH    LAB: PT (Prothrombin Time) ([id number]) Abnormal    Prothrombin Time 10.3 9.1-12.0 - SEC    INR 1.0 0.8-1.2 -     Jovi Alvizo M 09/09/2012 09:12:04 AM > Corson,Danielle 09/09/2012 10:03:25 AM > FOR CATH    Diagnostic Imaging:Cardiac Cath (Ordered for 09/15/2012)  I have reviewed the findings of the nuclear stress test with the patient. I have recommended proceeding with cardiac cath given her SOB and abnormal stress test. I will set her up for cardiac cath. , Risks and benefits of cardiac catheterization have been reviewed including risk of stroke, heart attack, death, bleeding, renal impariment and arterial damage. There was ample oppurtuny to answer questions. Alternatives were discussed. Patient understands and wishes to proceed.        Procedures:  Venipuncture:  Venipuncture: Smith,Michele 09/08/2012 03:53:03 PM > , performed in right arm.        Immunizations:        Labs:        Procedure Codes: 32440 ECL BMP, [id number] ECL CBC PLATELET DIFF, [id number] BLOOD COLLECTION ROUTINE VENIPUNCTURE       Preventive:        Provider: Armanda Magic, MD  Patient: Brooklin, Rieger DOB:  [birth year] Date: 09/08/2012

## 2012-09-13 ENCOUNTER — Inpatient Hospital Stay (HOSPITAL_BASED_OUTPATIENT_CLINIC_OR_DEPARTMENT_OTHER)
Admission: RE | Admit: 2012-09-13 | Discharge: 2012-09-13 | Disposition: A | Discharge status: Home / Self Care | Payer: 59 | Source: Ambulatory Visit | Attending: Cardiology | Admitting: Cardiology

## 2012-09-13 ENCOUNTER — Encounter (HOSPITAL_BASED_OUTPATIENT_CLINIC_OR_DEPARTMENT_OTHER)
Admission: RE | Disposition: A | Discharge status: Home / Self Care | Payer: Self-pay | Source: Ambulatory Visit | Attending: Cardiology

## 2012-09-13 DIAGNOSIS — I5189 Other ill-defined heart diseases: Secondary | ICD-10-CM | POA: Diagnosis not present

## 2012-09-13 DIAGNOSIS — R0602 Shortness of breath: Secondary | ICD-10-CM

## 2012-09-13 DIAGNOSIS — R9439 Abnormal result of other cardiovascular function study: Secondary | ICD-10-CM | POA: Diagnosis present

## 2012-09-13 SURGERY — JV LEFT HEART CATHETERIZATION WITH CORONARY ANGIOGRAM
Anesthesia: Moderate Sedation

## 2012-09-13 MED ORDER — SODIUM CHLORIDE 0.9 % IJ SOLN: 3.0000 mL | INTRAMUSCULAR | Status: DC | PRN

## 2012-09-13 MED ORDER — ONDANSETRON HCL 4 MG/2ML IJ SOLN: 4.0000 mg | Freq: Four times a day (QID) | INTRAMUSCULAR | Status: DC | PRN

## 2012-09-13 MED ORDER — SODIUM CHLORIDE 0.9 % IV SOLN: 1.0000 mL/kg/h | INTRAVENOUS | Status: DC

## 2012-09-13 MED ORDER — DIAZEPAM 5 MG PO TABS
5.0000 mg | ORAL_TABLET | ORAL | Status: AC
  Administered 2012-09-13: 5 mg via ORAL

## 2012-09-13 MED ORDER — METOPROLOL SUCCINATE ER 25 MG PO TB24: 25.0000 mg | ORAL_TABLET | Freq: Every day | ORAL | Status: DC

## 2012-09-13 MED ORDER — SODIUM CHLORIDE 0.9 % IV SOLN: INTRAVENOUS | Status: DC

## 2012-09-13 MED ORDER — ACETAMINOPHEN 325 MG PO TABS: 650.0000 mg | ORAL_TABLET | ORAL | Status: DC | PRN

## 2012-09-13 MED ORDER — FUROSEMIDE 40 MG PO TABS: 40.0000 mg | ORAL_TABLET | Freq: Two times a day (BID) | ORAL | Status: DC

## 2012-09-13 MED ORDER — SODIUM CHLORIDE 0.9 % IJ SOLN: 3.0000 mL | Freq: Two times a day (BID) | INTRAMUSCULAR | Status: DC

## 2012-09-13 MED ORDER — SODIUM CHLORIDE 0.9 % IV SOLN: 250.0000 mL | INTRAVENOUS | Status: DC | PRN

## 2012-09-13 MED ORDER — ASPIRIN 81 MG PO CHEW
324.0000 mg | CHEWABLE_TABLET | ORAL | Status: AC
  Administered 2012-09-13: 324 mg via ORAL

## 2012-09-13 NOTE — Interval H&P Note (Signed)
History and Physical Interval Note:  09/13/2012 8:43 AM  Veronica Baker  has presented today for surgery, with the diagnosis of chest pain  The various methods of treatment have been discussed with the patient and family. After consideration of risks, benefits and other options for treatment, the patient has consented to  Procedure(s) (LRB) with comments: JV LEFT HEART CATHETERIZATION WITH CORONARY ANGIOGRAM (N/A) as a surgical intervention .  The patient's history has been reviewed, patient examined, no change in status, stable for surgery.  I have reviewed the patient's chart and labs.  Questions were answered to the patient's satisfaction.     TURNER,TRACI R

## 2012-09-13 NOTE — OR Nursing (Signed)
Tegaderm dressing applied, site level 0, bedrest begins at 0920 

## 2012-09-13 NOTE — OR Nursing (Signed)
Dr Turner at bedside to discuss results and treatment plan with pt and family 

## 2012-09-13 NOTE — CV Procedure (Signed)
PROCEDURE:  Left heart catheterization with selective coronary angiography, left ventriculogram.  INDICATIONS:  SOB and abnormal nuclear stress test  The risks, benefits, and details of the procedure were explained to the patient.  The patient verbalized understanding and wanted to proceed.  Informed written consent was obtained.  PROCEDURE TECHNIQUE:  After Xylocaine anesthesia a 28F sheath was placed in the right femoral artery with a single anterior needle wall stick.   Left coronary angiography was done using a Judkins L4 guide catheter.  Right coronary angiography was done using a Judkins R4 guide catheter.  Left ventriculography was done using a pigtail catheter.    CONTRAST:  Total of 75 cc.  COMPLICATIONS:  None.    HEMODYNAMICS:  Aortic pressure was 117/57mmHg; LV pressure was 117/66mmHg; LVEDP 18-49mmHg.  There was no gradient between the left ventricle and aorta.    ANGIOGRAPHIC DATA:   The left main coronary artery is widely patent and bifurcates into an LAD and left circumflex artery.  The left anterior descending artery is widely patent.  It gives rise to a first diagonal which is patent and then gives rise to a second patent diagonal.    The left circumflex artery is widely patent.  It gives rise to a moderate sized OM 1 which is widely patent.  The ongoing left circumflex is free of disease and give rise to a large OM2 which is widely patent.The ongoing left circumflex traverses the AV groove and is patent.  The right coronary artery is widely patent throughout its course.  It gives rise to a large acute RV marginal branch which is patent.  The ongoing RCA gives rise to a PDA and PL branches which are patent.  LEFT VENTRICULOGRAM:  Left ventricular angiogram was done in the 30 RAO projection and revealed normal left ventricular wall motion and systolic function with an estimated ejection fraction of 55%.  LVEDP was 18-23 mmHg.  IMPRESSIONS:  1. Normal left main coronary  artery. 2. Normal left anterior descending artery and its branches. 3. Normal left circumflex artery and its branches. 4. Normal right coronary artery. 5. Normal left ventricular systolic function.  LVEDP 18-23 mmHg.  Ejection fraction 55%. 6.  Elevated LVEDP c/w diastolic dysfunction  RECOMMENDATION:   1.  D/C home once IVF and bedrest complete. 2.  Continue current medications 3.  Increase Lasix to 40mg  BID for elevated LVEDP 4.  Add Toprol 25mg  daily for diastolic dysfunction 5.  Followup in my office in 1 week with BMET

## 2012-09-13 NOTE — H&P (View-Only) (Signed)
Office Visit     Patient: Veronica Baker, Veronica Baker Provider: Cheron Pasquarelli, MD  DOB: 11/05/1950 Age: 61 Y Sex: Female Date: 09/08/2012  Phone: [phone]   Address: [address] , [place], [id number]  Pcp: Alyia WHITE       Subjective:     CC:    1. TT/Stress Test F/u.        HPI:  General:  The patient presents today for evaluation of SOB and edema. She was recently seen by her primary MD for increasing LE edema and her Lasix dose was increased. Her BNP at that time was normal. She does use NSAIDs. An echo done earlier in the summer showed normal LVF. She says that her LE edema has improved. The swelling occurs more when she stands or sits too long. She says that the SOB has been occurring for about 3 months. She denies any chest pain, palpitations, dizziness or syncope. She underwent nuclear stress test which showed a moderate area of reversibility in the inferolateral wall c/w ischemia and normal LVF EF 68%..        ROS:  See HPI, A twelve system review was perfomed at today'Baker visit. For pertinent positives and negatives see HPI.       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 Shadad, Angina, h/o depression/bipolar, previously saw Dr. Cottle, was released in 2010, Edema, Hypertension.        Surgical History: hysterectomy , breast cyst removal , appendectomy , colonoscopy 05/2004 , umbilical hernia repair , bilateral oopherectomy, benign .        Family History: Father: deceased 33 yrs stabbed at 33 Mother: alive Alzheimers Paternal Grand Father: deceased Paternal Grand Mother: deceased Maternal Grand Father: deceased Maternal Grand Mother: deceased Brother 1: alive Sister 1: deceased died of sepsis Sister 2: alive 1 brother(Baker) , 2 sister(Baker) .        Social History:  General:  History of smoking  cigarettes: Never smoked no Smoking.  no Alcohol.  no  Recreational drug use.  Exercise: walking daily with her dog.  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  Marital Status: Divorced.  Children: Gregory 1974, Harvest 1975.        Medications: Lipitor 20 MG Tablet 1 tablet Once a day, Proventil HFA 108 (90 Base) MCG/ACT Aerosol Solution 2 puffs as needed every 6 hrs as needed, Hydrocodone-APAP-Dietary Prod 7.5-500mg Miscellaneous one teaspoon every 6 hrs, MiraLax Powder 17 gm three times a day, Cyclobenzaprine HCl 10 MG Tablet 1/2-1 tablet Three times a day as needed for spasm, Sertraline HCl 50 MG Tablet 1 tablet Once a day, Risperdal 2 mg Tablet 1 tablet Once a day at dinnertime, Metolazone 2.5 MG Tablet 1 tablet every other day, Furosemide 40mg Tablet 1-2 tablets daily, as needed for swelling, Naproxen 500 MG Tablet Delayed Release 1 tablet as needed, Lyrica 50 MG Capsule 1 capsule Once a day, Medication List reviewed and reconciled with the patient       Allergies: Penicillin V Potassium: rash, Tramadol HCl: upset stomach.       Objective:     Vitals: Wt 218, Wt change 3.2 lb, Ht 62, BMI 39.87, Pulse sitting 88, BP sitting 120/80.       Examination:        Assessment:     Assessment:  1. Abnormal cardiovascular stress test - 794.39 (Primary)  2. Benign essential HTN - 401.1  3. SOB (shortness of breath) -   786.05    Plan:     1. Abnormal cardiovascular stress test  LAB: Basic Metabolic    GLUCOSE 91 70-99 - mg/dL    BUN 7 6-26 - mg/dL    CREATININE 0.93 0.60-1.30 - mg/dl    eGFR (NON-AFRICAN AMERICAN) 62 >60 - calc    eGFR (AFRICAN AMERICAN) 74 >60 - calc    SODIUM 140 136-145 - mmol/L    POTASSIUM 3.3 3.5-5.5 - mmol/L L   CHLORIDE 98 98-107 - mmol/L    C02 33 22-32 - mmol/L H   ANION GAP 11.9 6.0-20.0 - mmol/L    CALCIUM 9.0 8.6-10.3 - mg/dL     Emali Heyward M 09/08/2012 02:49:58 PM > low potassium - please have patient take Kdur 20meq 2 tablets today then 1 tablet daily and recheck Monday 1/13 Corson,Danielle  09/08/2012 04:29:30 PM > Pt is aware. New meds sent in for pt. Labs set up for 1/13.    LAB: CBC with Diff Stable    WBC 6.2 4.0-11.0 - K/ul    RBC 4.33 4.20-5.40 - M/uL    HGB 11.4 12.0-16.0 - g/dL L   HCT 35.7 37.0-47.0 - % L   MCH 26.2 27.0-33.0 - pg L   MPV 10.0 7.5-10.7 - fL    MCV 82.5 81.0-99.0 - fL    MCHC 31.8 32.0-36.0 - g/dL L   RDW 14.3 11.5-15.5 - %    NRBC# 0.00 -    PLT 172 150-400 - K/uL    NEUT % 48.4 43.3-71.9 - %    NRBC% 0.10 - %    LYMPH% 35.2 16.8-43.5 - %    MONO % 11.2 4.6-12.4 - %    EOS % 4.5 0.0-7.8 - %    BASO % 0.7 0.0-1.0 - %    NEUT # 3.0 1.9-7.2 - K/uL    LYMPH# 2.20 1.10-2.70 - K/uL    MONO # 0.7 0.3-0.8 - K/uL    EOS # 0.3 0.0-0.6 - K/uL    BASO # 0.0 0.0-0.1 - K/uL     Anthany Thornhill M 09/08/2012 11:21:58 AM > Corson,Danielle 09/08/2012 12:07:20 PM > FOR CATH    LAB: PT (Prothrombin Time) (005199) Abnormal    Prothrombin Time 10.3 9.1-12.0 - SEC    INR 1.0 0.8-1.2 -     Nasir Bright M 09/09/2012 09:12:04 AM > Corson,Danielle 09/09/2012 10:03:25 AM > FOR CATH    Diagnostic Imaging:Cardiac Cath (Ordered for 09/15/2012)  I have reviewed the findings of the nuclear stress test with the patient. I have recommended proceeding with cardiac cath given her SOB and abnormal stress test. I will set her up for cardiac cath. , Risks and benefits of cardiac catheterization have been reviewed including risk of stroke, heart attack, death, bleeding, renal impariment and arterial damage. There was ample oppurtuny to answer questions. Alternatives were discussed. Patient understands and wishes to proceed.        Procedures:  Venipuncture:  Venipuncture: Smith,Michele 09/08/2012 03:53:03 PM > , performed in right arm.        Immunizations:        Labs:        Procedure Codes: 80048 ECL BMP, 85025 ECL CBC PLATELET DIFF, 36415 BLOOD COLLECTION ROUTINE VENIPUNCTURE       Preventive:        Provider: Sreeja Spies, MD  Patient: Veronica Baker, Veronica Baker DOB:  03/15/1951 Date: 09/08/2012    

## 2013-03-29 ENCOUNTER — Telehealth: Payer: Self-pay | Admitting: *Deleted

## 2013-03-29 MED ORDER — ATORVASTATIN CALCIUM 20 MG PO TABS: 20.0000 mg | ORAL_TABLET | Freq: Every day | ORAL | Status: DC

## 2013-03-29 NOTE — Telephone Encounter (Signed)
Refill request for atorvastain 20mg .  New Patient appt scheduled for 8.19.14

## 2013-03-29 NOTE — Telephone Encounter (Signed)
Refill done.  

## 2013-03-29 NOTE — Telephone Encounter (Signed)
Ok to refill #30, 0 refills 

## 2013-04-18 ENCOUNTER — Ambulatory Visit: Payer: 59 | Admitting: Internal Medicine

## 2014-12-05 ENCOUNTER — Other Ambulatory Visit: Payer: Self-pay | Admitting: Obstetrics and Gynecology

## 2014-12-06 LAB — CYTOLOGY - PAP

## 2014-12-07 ENCOUNTER — Other Ambulatory Visit: Payer: Self-pay | Admitting: Obstetrics and Gynecology

## 2014-12-07 DIAGNOSIS — R928 Other abnormal and inconclusive findings on diagnostic imaging of breast: Secondary | ICD-10-CM

## 2014-12-12 ENCOUNTER — Ambulatory Visit
Admission: RE | Admit: 2014-12-12 | Discharge: 2014-12-12 | Disposition: A | Discharge status: Home / Self Care | Payer: 59 | Source: Ambulatory Visit | Attending: Obstetrics and Gynecology | Admitting: Obstetrics and Gynecology

## 2014-12-12 DIAGNOSIS — R928 Other abnormal and inconclusive findings on diagnostic imaging of breast: Secondary | ICD-10-CM

## 2015-05-09 DIAGNOSIS — M549 Dorsalgia, unspecified: Secondary | ICD-10-CM | POA: Insufficient documentation

## 2015-05-09 DIAGNOSIS — G8929 Other chronic pain: Secondary | ICD-10-CM | POA: Insufficient documentation

## 2015-05-09 DIAGNOSIS — Z862 Personal history of diseases of the blood and blood-forming organs and certain disorders involving the immune mechanism: Secondary | ICD-10-CM | POA: Insufficient documentation

## 2015-05-09 DIAGNOSIS — F319 Bipolar disorder, unspecified: Secondary | ICD-10-CM | POA: Insufficient documentation

## 2015-05-27 DIAGNOSIS — J4540 Moderate persistent asthma, uncomplicated: Secondary | ICD-10-CM | POA: Insufficient documentation

## 2015-05-27 DIAGNOSIS — E669 Obesity, unspecified: Secondary | ICD-10-CM | POA: Insufficient documentation

## 2015-10-17 ENCOUNTER — Encounter (INDEPENDENT_AMBULATORY_CARE_PROVIDER_SITE_OTHER): Payer: Medicare HMO | Admitting: Ophthalmology

## 2015-10-17 DIAGNOSIS — I10 Essential (primary) hypertension: Secondary | ICD-10-CM | POA: Diagnosis not present

## 2015-10-17 DIAGNOSIS — H43813 Vitreous degeneration, bilateral: Secondary | ICD-10-CM | POA: Diagnosis not present

## 2015-10-17 DIAGNOSIS — H2513 Age-related nuclear cataract, bilateral: Secondary | ICD-10-CM

## 2015-10-17 DIAGNOSIS — H35033 Hypertensive retinopathy, bilateral: Secondary | ICD-10-CM

## 2015-10-17 DIAGNOSIS — H35341 Macular cyst, hole, or pseudohole, right eye: Secondary | ICD-10-CM | POA: Diagnosis not present

## 2017-01-04 ENCOUNTER — Emergency Department (HOSPITAL_COMMUNITY): Payer: Medicare Other

## 2017-01-04 ENCOUNTER — Emergency Department (HOSPITAL_COMMUNITY)
Admission: EM | Admit: 2017-01-04 | Discharge: 2017-01-04 | Disposition: A | Discharge status: Home / Self Care | Payer: Medicare Other | Attending: Emergency Medicine | Admitting: Emergency Medicine

## 2017-01-04 ENCOUNTER — Encounter (HOSPITAL_COMMUNITY): Payer: Self-pay | Admitting: Emergency Medicine

## 2017-01-04 DIAGNOSIS — R0602 Shortness of breath: Secondary | ICD-10-CM | POA: Diagnosis not present

## 2017-01-04 DIAGNOSIS — R059 Cough, unspecified: Secondary | ICD-10-CM

## 2017-01-04 DIAGNOSIS — R05 Cough: Secondary | ICD-10-CM | POA: Diagnosis not present

## 2017-01-04 DIAGNOSIS — I10 Essential (primary) hypertension: Secondary | ICD-10-CM | POA: Diagnosis not present

## 2017-01-04 DIAGNOSIS — Z79899 Other long term (current) drug therapy: Secondary | ICD-10-CM | POA: Insufficient documentation

## 2017-01-04 LAB — CBC
HEMATOCRIT: 30.9 % — AB (ref 36.0–46.0)
Hemoglobin: 9.8 g/dL — ABNORMAL LOW (ref 12.0–15.0)
MCH: 27.5 pg (ref 26.0–34.0)
MCHC: 31.7 g/dL (ref 30.0–36.0)
MCV: 86.6 fL (ref 78.0–100.0)
Platelets: 140 10*3/uL — ABNORMAL LOW (ref 150–400)
RBC: 3.57 MIL/uL — ABNORMAL LOW (ref 3.87–5.11)
RDW: 13.7 % (ref 11.5–15.5)
WBC: 8.2 10*3/uL (ref 4.0–10.5)

## 2017-01-04 LAB — COMPREHENSIVE METABOLIC PANEL
ALBUMIN: 3.2 g/dL — AB (ref 3.5–5.0)
ALT: 14 U/L (ref 14–54)
AST: 23 U/L (ref 15–41)
Alkaline Phosphatase: 95 U/L (ref 38–126)
Anion gap: 11 (ref 5–15)
BILIRUBIN TOTAL: 0.4 mg/dL (ref 0.3–1.2)
BUN: 9 mg/dL (ref 6–20)
CHLORIDE: 105 mmol/L (ref 101–111)
CO2: 25 mmol/L (ref 22–32)
Calcium: 8.5 mg/dL — ABNORMAL LOW (ref 8.9–10.3)
Creatinine, Ser: 1.07 mg/dL — ABNORMAL HIGH (ref 0.44–1.00)
GFR calc Af Amer: >60 mL/min (ref >60)
GFR, EST NON AFRICAN AMERICAN: 54 mL/min — AB (ref >60)
Glucose, Bld: 125 mg/dL — ABNORMAL HIGH (ref 65–99)
POTASSIUM: 3.2 mmol/L — AB (ref 3.5–5.1)
Sodium: 141 mmol/L (ref 135–145)
Total Protein: 6.9 g/dL (ref 6.5–8.1)

## 2017-01-04 LAB — BRAIN NATRIURETIC PEPTIDE: B Natriuretic Peptide: 109.3 pg/mL — ABNORMAL HIGH (ref 0.0–100.0)

## 2017-01-04 MED ORDER — IOPAMIDOL (ISOVUE-370) INJECTION 76%
100.0000 mL | Freq: Once | INTRAVENOUS | Status: AC | PRN
  Administered 2017-01-04: 100 mL via INTRAVENOUS

## 2017-01-04 MED ORDER — ALBUTEROL SULFATE HFA 108 (90 BASE) MCG/ACT IN AERS: 1.0000 | INHALATION_SPRAY | Freq: Four times a day (QID) | RESPIRATORY_TRACT | 0 refills | Status: DC | PRN

## 2017-01-04 MED ORDER — ALBUTEROL SULFATE (2.5 MG/3ML) 0.083% IN NEBU
5.0000 mg | INHALATION_SOLUTION | Freq: Once | RESPIRATORY_TRACT | Status: AC
  Administered 2017-01-04: 5 mg via RESPIRATORY_TRACT

## 2017-01-04 MED ORDER — FUROSEMIDE 10 MG/ML IJ SOLN
40.0000 mg | Freq: Once | INTRAMUSCULAR | Status: AC
  Administered 2017-01-04: 40 mg via INTRAVENOUS
  Filled 2017-01-04: qty 4

## 2017-01-04 MED ORDER — ALBUTEROL SULFATE (2.5 MG/3ML) 0.083% IN NEBU
INHALATION_SOLUTION | RESPIRATORY_TRACT | Status: AC
  Filled 2017-01-04: qty 6

## 2017-01-04 MED ORDER — PREDNISONE 10 MG PO TABS: 40.0000 mg | ORAL_TABLET | Freq: Every day | ORAL | 0 refills | Status: AC

## 2017-01-04 MED ORDER — ALBUTEROL (5 MG/ML) CONTINUOUS INHALATION SOLN
10.0000 mg/h | INHALATION_SOLUTION | RESPIRATORY_TRACT | Status: DC
  Administered 2017-01-04: 10 mg/h via RESPIRATORY_TRACT
  Filled 2017-01-04: qty 20

## 2017-01-04 MED ORDER — BENZONATATE 100 MG PO CAPS: 100.0000 mg | ORAL_CAPSULE | Freq: Three times a day (TID) | ORAL | 0 refills | Status: DC | PRN

## 2017-01-04 MED ORDER — METHYLPREDNISOLONE ACETATE 80 MG/ML IJ SUSP
120.0000 mg | Freq: Once | INTRAMUSCULAR | Status: AC
  Administered 2017-01-04: 120 mg via INTRAMUSCULAR
  Filled 2017-01-04: qty 2

## 2017-01-04 NOTE — ED Provider Notes (Signed)
[facility]-EMERGENCY DEPT Provider Note   CSN: [id number] Arrival date & time: 01/04/17  0815     History   Chief Complaint Chief Complaint  Patient presents with  . Shortness of Breath  . Cough  . Hypertension    HPI Veronica Baker is a 66 y.o. female with PMHx of asthma, sarcoidosis, HTN, HLD, anemia Presents today with complaints of shortness of breath and cough for 3 days. She reports associated productive cough with brown mucous and wheezing that has worsened today. She also states her upper back hurts on deep inspiration. She states her symptoms are worse at night. She states she has tried her inhaler with minimal relief. She denies any fevers, chills, chest pain, nausea, vomiting, abdominal pain, trauma. She denies any recent travel, hormone use, unilateral leg swelling, history of PE or DVT. She denies any history of smoking or COPD. She denies any hematuria, hemoptysis, or blood in the stool.   The history is provided by the patient. No language interpreter was used.  Shortness of Breath  Associated symptoms include cough and wheezing. Pertinent negatives include no fever, no chest pain, no vomiting and no abdominal pain.  Cough  Associated symptoms include shortness of breath and wheezing. Pertinent negatives include no chest pain and no chills.  Hypertension  Associated symptoms include shortness of breath. Pertinent negatives include no chest pain and no abdominal pain.    Past Medical History:  Diagnosis Date  . Anemia   . Arthritis   . Blood dyscrasia    polyclonal gamopathy  . Depression    Bipolar  . Fibromyalgia   . Hyperlipidemia   . Hypertension   . Sarcoidosis     Patient Active Problem List   Diagnosis Date Noted  . Abnormal cardiovascular stress test 09/13/2012  . Diastolic dysfunction 09/13/2012  . SOB (shortness of breath) 09/06/2012    Past Surgical History:  Procedure Laterality Date  . ABDOMINAL HYSTERECTOMY    . APPENDECTOMY    . bilateral  oophorectomy    . BREAST SURGERY    . COLON SURGERY     colonscopy  . UMBILICAL HERNIA REPAIR      OB History    No data available       Home Medications    Prior to Admission medications   Medication Sig Start Date End Date Taking? Authorizing Provider  acetaminophen (TYLENOL) 500 MG tablet Take 500 mg by mouth every 6 (six) hours as needed for mild pain.   Yes [provider]  amLODipine (NORVASC) 2.5 MG tablet Take 2.5 mg by mouth daily.   Yes [provider]  chlorthalidone (HYGROTON) 25 MG tablet Take 25 mg by mouth daily.   Yes [provider]  cyclobenzaprine (FLEXERIL) 10 MG tablet Take 5 mg by mouth 2 (two) times daily.    Yes [provider]  Ferrous Sulfate 27 MG TABS Take 1 tablet by mouth daily.   Yes [provider]  fluticasone (FLONASE) 50 MCG/ACT nasal spray Place 1 spray into both nostrils daily as needed for allergies or rhinitis.   Yes [provider]  Fluticasone-Salmeterol (ADVAIR) 250-50 MCG/DOSE AEPB Inhale 1 puff into the lungs 2 (two) times daily.   Yes [provider]  furosemide (LASIX) 40 MG tablet Take 1 tablet (40 mg total) by mouth 2 (two) times daily. Patient taking differently: Take 20 mg by mouth daily as needed for fluid.  09/13/12  Yes Quintella Reichert, MD  meloxicam (MOBIC) 15 MG  tablet Take 15 mg by mouth daily as needed for pain.   Yes [provider]  metoprolol succinate (TOPROL XL) 25 MG 24 hr tablet Take 1 tablet (25 mg total) by mouth daily. 09/13/12  Yes Turner, Cornelious Bryant, MD  Multiple Vitamin (MULTIVITAMIN) tablet Take 1 tablet by mouth daily.   Yes [provider]  potassium chloride SA (K-DUR,KLOR-CON) 20 MEQ tablet Take 20 mEq by mouth daily.   Yes [provider]  QUEtiapine (SEROQUEL) 300 MG tablet Take 300 mg by mouth at bedtime.   Yes [provider]  risperiDONE (RISPERDAL) 2 MG tablet Take 2 mg by mouth daily.   Yes [provider]  sertraline (ZOLOFT) 50 MG tablet Take 200 mg by mouth daily.    Yes [provider]  simvastatin (ZOCOR) 20 MG tablet Take 20 mg by mouth daily.   Yes [provider]  albuterol (PROVENTIL HFA;VENTOLIN HFA) 108 (90 Base) MCG/ACT inhaler Inhale 1-2 puffs into the lungs every 6 (six) hours as needed for wheezing or shortness of breath. 01/04/17   Alvina Chou, PA  atorvastatin (LIPITOR) 20 MG tablet Take 1 tablet (20 mg total) by mouth daily. Patient not taking: Reported on 01/04/2017 03/29/13   Lorre Munroe, NP  benzonatate (TESSALON) 100 MG capsule Take 1 capsule (100 mg total) by mouth 3 (three) times daily as needed for cough. 01/04/17   Alvina Chou, PA  naproxen (NAPROSYN) 500 MG tablet Take 1 tablet (500 mg total) by mouth 2 (two) times daily with a meal. Patient not taking: Reported on 01/04/2017 10/25/11   Domenick Gong, MD  predniSONE (DELTASONE) 10 MG tablet Take 4 tablets (40 mg total) by mouth daily. 01/04/17 01/09/17  Alvina Chou, PA    Family History Family History  Problem Relation Age of Onset  . Hypertension Brother   . Coronary artery disease Brother   . Asthma Brother     Social History Social History  Substance Use Topics  . Smoking status: Never Smoker  . Smokeless tobacco: Never Used  . Alcohol use No     Allergies   Tramadol and Penicillins   Review of Systems Review of Systems  Constitutional: Negative for chills and fever.  HENT: Negative for congestion.   Respiratory: Positive for cough, shortness of breath and wheezing.   Cardiovascular: Negative for chest pain.  Gastrointestinal: Negative for abdominal pain, diarrhea, nausea and vomiting.  All other systems reviewed and are negative.   Physical Exam Updated Vital Signs BP (!) 104/57 (BP Location: Left Arm)   Pulse 65   Temp 98.1 F (36.7 C) (Oral)   Resp (!) 21   Ht 5\' 1"  (1.549 m)   Wt 101.2 kg   SpO2 99%   BMI 42.14 kg/m    Physical Exam  Constitutional: She appears well-developed and well-nourished.  She has some difficulty with full sentences.   HENT:  Head: Normocephalic and atraumatic.  Nose: Nose normal.  Mouth/Throat: Oropharynx is clear and moist.  Eyes: Conjunctivae and EOM are normal.  Neck: Normal range of motion. No JVD present. No tracheal deviation present.  Cardiovascular: Normal rate, normal heart sounds and intact distal pulses.   2+ distal pulses in all 4 extremities.   Pulmonary/Chest: Effort normal. No stridor. She has wheezes. She has no rales. She exhibits no tenderness.  Normal work of breathing. No respiratory distress noted.   Abdominal: Soft. There is no tenderness. There is no rebound and no guarding.  Musculoskeletal: Normal range of motion.  Neurological: She is alert.  Skin: Skin is warm.  Bilateral swelling of legs bilaterally, pt states this is chronic and not new. No TTP. No calf tenderness bilaterally. Negative Homans sign. No discoloration of legs or redness noted bilaterally.   Psychiatric: She has a normal mood and affect. Her behavior is normal.  Nursing note and vitals reviewed.    ED Treatments / Results  Labs (all labs ordered are listed, but only abnormal results are displayed) Labs Reviewed  COMPREHENSIVE METABOLIC PANEL - Abnormal; Notable for the following:       Result Value   Potassium 3.2 (*)    Glucose, Bld 125 (*)    Creatinine, Ser 1.07 (*)    Calcium 8.5 (*)    Albumin 3.2 (*)    GFR calc non Af Amer 54 (*)    All other components within normal limits  CBC - Abnormal; Notable for the following:    RBC 3.57 (*)    Hemoglobin 9.8 (*)    HCT 30.9 (*)    Platelets 140 (*)    All other components within normal limits  BRAIN NATRIURETIC PEPTIDE - Abnormal; Notable for the following:    B Natriuretic Peptide 109.3 (*)    All other components within normal limits    EKG  EKG Interpretation None       Radiology Dg Chest 2 View  Result  Date: 01/04/2017 CLINICAL DATA:  Cough and shortness of breath.  Wheezing. EXAM: CHEST  2 VIEW COMPARISON:  Two-view chest x-ray 08/02/2012. FINDINGS: The heart size is normal. A slight increase is noted in the underlying interstitial pattern. Mild central airway thickening is present as well. No focal airspace disease is present. There are no effusions. Linear airspace disease at the bases likely reflects atelectasis. Degenerative changes at the shoulders has advanced. IMPRESSION: 1. Slight increase in a diffuse interstitial pattern. Mild edema is not excluded. 2. Central airway thickening compatible with reactive airways disease. 3. Mild bibasilar atelectasis. Electronically Signed   By: Marin Roberts M.D.   On: 01/04/2017 09:00   Ct Angio Chest Pe W/cm &/or Wo Cm  Result Date: 01/04/2017 CLINICAL DATA:  Shortness of breath, cough. EXAM: CT ANGIOGRAPHY CHEST WITH CONTRAST TECHNIQUE: Multidetector CT imaging of the chest was performed using the standard protocol during bolus administration of intravenous contrast. Multiplanar CT image reconstructions and MIPs were obtained to evaluate the vascular anatomy. CONTRAST:  100 mL of Isovue 370 intravenously. COMPARISON:  Radiographs of same day. FINDINGS: Cardiovascular: Satisfactory opacification of the pulmonary arteries to the segmental level. No evidence of pulmonary embolism. Normal heart size. No pericardial effusion. Mediastinum/Nodes: No enlarged mediastinal, hilar, or axillary lymph nodes. Thyroid gland, trachea, and esophagus demonstrate no significant findings. Lungs/Pleura: Lungs are clear. No pleural effusion or pneumothorax. Upper Abdomen: Calcified splenic cyst is noted. No other abnormality seen. Musculoskeletal: No chest wall abnormality. No acute or significant osseous findings. Review of the MIP images confirms the above findings. IMPRESSION: No definite evidence of pulmonary embolus. No acute abnormality seen in the chest. Electronically  Signed   By: Lupita Raider, M.D.   On: 01/04/2017 15:49    Procedures Procedures (including critical care time)  Medications Ordered in ED Medications  albuterol (PROVENTIL) (2.5 MG/3ML) 0.083% nebulizer solution (not administered)  albuterol (PROVENTIL,VENTOLIN) solution continuous neb (10 mg/hr Nebulization New Bag/Given 01/04/17 1025)  albuterol (PROVENTIL) (2.5 MG/3ML) 0.083% nebulizer solution 5 mg (5 mg Nebulization Given 01/04/17 0833)  methylPREDNISolone  acetate (DEPO-MEDROL) injection 120 mg (120 mg Intramuscular Given 01/04/17 1035)  furosemide (LASIX) injection 40 mg (40 mg Intravenous Given 01/04/17 1320)  iopamidol (ISOVUE-370) 76 % injection 100 mL (100 mLs Intravenous Contrast Given 01/04/17 1515)     Initial Impression / Assessment and Plan / ED Course  I have reviewed the triage vital signs and the nursing notes.  Pertinent labs & imaging results that were available during my care of the patient were reviewed by me and considered in my medical decision making (see chart for details).     Patient here with shortness of breath, cough 3 days. Possible exacerbation of her asthma. She states this morning was worse. Patient was having trouble with full sentences during exam however was not using any accessory muscles or tripoding. She is afebrile. She has been tachycardic and slightly tachypneic on some vital readings. Heart sounds clear. Noticeable wheezing on auscultation. Abdomen soft and nontender. No focal tenderness. Patient given albuterol inhaler, continuous nebulizer, and steroids here. She states that she felt better after treatment but not back to her norm. Patient's tachycardia could be related to the treatment of albuterol. X-ray did show slight increase in a diffuse interstitial pattern, mild edema is not excluded. Central airway thickening compatible with reactive airway disease. Patient's labs show a mild increase in Cr to 1.07, minimally elevated BNP 109.3, decrease in  Hgb to 9.8. Otherwise similar to previous. She denies any source of bleeding. Patient takes Lasix as needed by her PCP. Due to the patient's likely edema on chest x-ray patient was started on Lasix here. Her edema on x-ray may be cause of patient's symptoms, as well as her previous history of asthma. Due to patient's continued tachycardia, tachypnea, sarcoidosis history will do CTA. She has not had to be on oxygen throughout visit.  CTA is negative for any evidence of pulmonary embolus or abnormality seen on chest. Patient will be discharged with close follow-up with primary care provider regarding today's visit including her shortness of breath and Hgb. Patient is to follow-up tomorrow with PCP. Patient will be given steroids, Tessalon Perles, and albuterol inhaler. The patient is safe for discharge at this time. Patient is agreeable with discharge and verbally understands. Reasons to immediately return to ED discussed. Pt case discussed with Dr. Rosalia Hammersay who agreed with assessment and plan.   Final Clinical Impressions(s) / ED Diagnoses   Final diagnoses:  Shortness of breath  Cough    New Prescriptions New Prescriptions   ALBUTEROL (PROVENTIL HFA;VENTOLIN HFA) 108 (90 BASE) MCG/ACT INHALER    Inhale 1-2 puffs into the lungs every 6 (six) hours as needed for wheezing or shortness of breath.   BENZONATATE (TESSALON) 100 MG CAPSULE    Take 1 capsule (100 mg total) by mouth 3 (three) times daily as needed for cough.   PREDNISONE (DELTASONE) 10 MG TABLET    Take 4 tablets (40 mg total) by mouth daily.     Candie Milespina, Francisco Richard[place]uel, [place] 01/04/17 1609    Candie Milespina, Francisco [place], [place] 01/04/17 1614    Margarita Grizzleay, Danielle, MD 01/05/17 1123

## 2017-01-04 NOTE — ED Notes (Signed)
Pt is in stable condition upon d/c and ambulates from ED. 

## 2017-01-04 NOTE — ED Triage Notes (Signed)
Pt to ER for evaluation of shortness of breath, cough, and hypertension. Cough for 3 days. Pt with audible wheezing at triage. Reports has inhalers at home and has been utilizing them. A/o x4. BP 126/93 at triage, pt reports had much higher readings at home.

## 2017-01-04 NOTE — ED Notes (Signed)
Patient transported to CT 

## 2017-01-04 NOTE — Discharge Instructions (Signed)
Please follow up with her primary care provider tomorrow regarding today's visit. Continue use of your albuterol inhaler.  Findings on CT: IMPRESSION:  No definite evidence of pulmonary embolus. No acute abnormality seen  in the chest.   Contact a health care provider if: Your condition does not improve as soon as expected. You have a hard time doing your normal activities, even after you rest. You have new symptoms. Get help right away if: Your shortness of breath gets worse. You have shortness of breath when you are resting. You feel light-headed or you faint. You have a cough that is not controlled with medicines. You cough up blood. You have pain with breathing. You have pain in your chest, arms, shoulders, or abdomen. You have a fever. You cannot walk up stairs or exercise the way that you normally do.

## 2017-05-28 ENCOUNTER — Encounter: Payer: Self-pay | Admitting: Vascular Surgery

## 2017-05-28 ENCOUNTER — Encounter: Payer: Medicare Other | Admitting: Vascular Surgery

## 2017-06-01 ENCOUNTER — Ambulatory Visit (INDEPENDENT_AMBULATORY_CARE_PROVIDER_SITE_OTHER): Payer: Medicare Other | Admitting: Vascular Surgery

## 2017-06-01 ENCOUNTER — Encounter: Payer: Self-pay | Admitting: Vascular Surgery

## 2017-06-01 ENCOUNTER — Other Ambulatory Visit: Payer: Self-pay | Admitting: *Deleted

## 2017-06-01 ENCOUNTER — Encounter: Payer: Self-pay | Admitting: *Deleted

## 2017-06-01 VITALS — BP 140/86 | HR 86 | Temp 97.9°F | Resp 16 | Ht 62.0 in | Wt 222.8 lb

## 2017-06-01 DIAGNOSIS — M316 Other giant cell arteritis: Secondary | ICD-10-CM | POA: Diagnosis not present

## 2017-06-01 NOTE — Progress Notes (Signed)
[facility] [place]  Patient name: Veronica Baker MRN: [id number] DOB: [birth year] Sex: female  REASON FOR CONSULT: Biopsy to evaluate possible temporal arteritis  HPI: Veronica Baker is a 66 y.o. female, who is seen today for scheduling biopsy for possible temporal arteritis. She was having visual symptoms and was seen by her ophthalmologist to was concerned about potential diagnosis of temporal arteritis. She did have some tenderness over her right temporal region. She tells me that she is continuing to have persistent headaches. Has been placed on steroids and did have an elevated sedimentation rate. No prior history of arteritis. 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 History of sarcoidosis as well.  Past Medical History:  Diagnosis Date  . Anemia   . Arthritis   . Blood dyscrasia    polyclonal gamopathy  . Depression    Bipolar  . Fibromyalgia   . Hyperlipidemia   . Hypertension   . Sarcoidosis     Family History  Problem Relation Age of Onset  . Hypertension Brother   . Coronary artery disease Brother   . Asthma Brother     SOCIAL HISTORY: Social History   Social History  . Marital status: Divorced    Spouse name: N/A  . Number of children: N/A  . Years of education: N/A   Occupational History  . Not on file.   Social History Main Topics  . Smoking status: Never Smoker  . Smokeless tobacco: Never Used  . Alcohol use No  . Drug use: No  . Sexual activity: Not on file   Other Topics Concern  . Not on file   Social History Narrative  . No narrative on file    Allergies  Allergen Reactions  . Tramadol Nausea Only  . Penicillins Rash    Current Outpatient Prescriptions  Medication Sig Dispense Refill  . acetaminophen (TYLENOL) 500 MG tablet Take 500 mg by mouth every 6 (six) hours as needed for mild pain.    Marland Kitchen albuterol (PROVENTIL  HFA;VENTOLIN HFA) 108 (90 Base) MCG/ACT inhaler Inhale 1-2 puffs into the lungs every 6 (six) hours as needed for wheezing or shortness of breath. 1 Inhaler 0  . amLODipine (NORVASC) 2.5 MG tablet Take 2.5 mg by mouth daily.    . benzonatate (TESSALON) 100 MG capsule Take 1 capsule (100 mg total) by mouth 3 (three) times daily as needed for cough. 21 capsule 0  . chlorthalidone (HYGROTON) 25 MG tablet Take 25 mg by mouth daily.    . Ferrous Sulfate 27 MG TABS Take 1 tablet by mouth daily.    . fluticasone (FLONASE) 50 MCG/ACT nasal spray Place 1 spray into both nostrils daily as needed for allergies or rhinitis.    . Fluticasone-Salmeterol (ADVAIR) 250-50 MCG/DOSE AEPB Inhale 1 puff into the lungs 2 (two) times daily.    . furosemide (LASIX) 40 MG tablet Take 1 tablet (40 mg total) by mouth 2 (two) times daily. (Patient taking differently: Take 20 mg by mouth daily as needed for fluid. ) 30 tablet 11  . meloxicam (MOBIC) 15 MG tablet Take 15 mg by mouth daily as needed for pain.    . Multiple Vitamin (MULTIVITAMIN) tablet Take 1 tablet by mouth daily.    Bertram Gala Glycol-Propyl Glycol (SYSTANE) 0.4-0.3 % SOLN Apply 1 drop to eye 4 (four) times daily.    . potassium chloride SA (K-DUR,KLOR-CON) 20 MEQ tablet Take 20 mEq  by mouth daily.    . predniSONE (DELTASONE) 20 MG tablet Take by mouth.    . QUEtiapine (SEROQUEL) 300 MG tablet Take 300 mg by mouth at bedtime.    . sertraline (ZOLOFT) 50 MG tablet Take 200 mg by mouth daily.     . simvastatin (ZOCOR) 20 MG tablet Take 20 mg by mouth daily.    . metoprolol succinate (TOPROL XL) 25 MG 24 hr tablet Take 1 tablet (25 mg total) by mouth daily. (Patient not taking: Reported on 06/01/2017) 30 tablet 11  . risperiDONE (RISPERDAL) 2 MG tablet Take 2 mg by mouth daily.     No current facility-administered medications for this visit.     REVIEW OF SYSTEMS:  [X]  denotes positive finding, [ ]  denotes negative finding Cardiac  Comments:  Chest pain or  chest pressure:    Shortness of breath upon exertion: x   Short of breath when lying flat:    Irregular heart rhythm:        Vascular    Pain in calf, thigh, or hip brought on by ambulation: x   Pain in feet at night that wakes you up from your sleep:  x   Blood clot in your veins:    Leg swelling:  x       Pulmonary    Oxygen at home:    Productive cough:     Wheezing:         Neurologic    Sudden weakness in arms or legs:     Sudden numbness in arms or legs:     Sudden onset of difficulty speaking or slurred speech:    Temporary loss of vision in one eye:     Problems with dizziness:         Gastrointestinal    Blood in stool:     Vomited blood:         Genitourinary    Burning when urinating:     Blood in urine:        Psychiatric    Major depression:         Hematologic    Bleeding problems:    Problems with blood clotting too easily: x       Skin    Rashes or ulcers:        Constitutional    Fever or chills:      PHYSICAL EXAM: Vitals:   06/01/17 1459  BP: 140/86  Pulse: 86  Resp: 16  Temp: 97.9 F (36.6 C)  TempSrc: Oral  SpO2: 97%  Weight: 222 lb 12.8 oz (101.1 kg)  Height: 5\' 2"  (1.575 m)    GENERAL: The patient is a well-nourished female, in no acute distress. The vital signs are documented above. CARDIOVASCULAR: 2+ radial pulses bilaterally. Palpable right temporal artery with some tenderness over this. No tenderness over her left temporal artery PULMONARY: There is good air exchange  ABDOMEN: Soft and non-tender  MUSCULOSKELETAL: There are no major deformities or cyanosis. NEUROLOGIC: No focal weakness or paresthesias are detected. SKIN: There are no ulcers or rashes noted. PSYCHIATRIC: The patient has a normal affect.  DATA:  None to review  MEDICAL ISSUES: Discussed my role in the potential diagnosis for temporal arteritis. Have recommended right temporal artery biopsy as an outpatient. She reports that she is leaving on a cruise is  Saturday and wants to defer until after she is return on October 14. We will schedule this at her earliest convenience. She understands the  location of the incision just in front of her ear with subcuticular closure and that it would take several days for the pathology determination of temporal arteritis   Larina Earthly, MD [facility] Tel [phone] Pager [phone]

## 2017-06-15 ENCOUNTER — Encounter (HOSPITAL_COMMUNITY): Payer: Self-pay | Admitting: Certified Registered Nurse Anesthetist

## 2017-06-15 ENCOUNTER — Encounter (HOSPITAL_COMMUNITY): Payer: Self-pay

## 2017-06-15 ENCOUNTER — Encounter (HOSPITAL_COMMUNITY)
Admission: RE | Admit: 2017-06-15 | Discharge: 2017-06-15 | Disposition: A | Discharge status: Home / Self Care | Payer: Medicare Other | Source: Ambulatory Visit | Attending: Vascular Surgery | Admitting: Vascular Surgery

## 2017-06-15 DIAGNOSIS — Z880 Allergy status to penicillin: Secondary | ICD-10-CM | POA: Diagnosis not present

## 2017-06-15 DIAGNOSIS — Z6839 Body mass index (BMI) 39.0-39.9, adult: Secondary | ICD-10-CM | POA: Diagnosis not present

## 2017-06-15 DIAGNOSIS — Z79899 Other long term (current) drug therapy: Secondary | ICD-10-CM | POA: Diagnosis not present

## 2017-06-15 DIAGNOSIS — F319 Bipolar disorder, unspecified: Secondary | ICD-10-CM | POA: Diagnosis not present

## 2017-06-15 DIAGNOSIS — I10 Essential (primary) hypertension: Secondary | ICD-10-CM | POA: Diagnosis not present

## 2017-06-15 DIAGNOSIS — J45909 Unspecified asthma, uncomplicated: Secondary | ICD-10-CM | POA: Diagnosis not present

## 2017-06-15 DIAGNOSIS — Z885 Allergy status to narcotic agent status: Secondary | ICD-10-CM | POA: Diagnosis not present

## 2017-06-15 DIAGNOSIS — M1990 Unspecified osteoarthritis, unspecified site: Secondary | ICD-10-CM | POA: Diagnosis not present

## 2017-06-15 DIAGNOSIS — D869 Sarcoidosis, unspecified: Secondary | ICD-10-CM | POA: Diagnosis not present

## 2017-06-15 DIAGNOSIS — D890 Polyclonal hypergammaglobulinemia: Secondary | ICD-10-CM | POA: Diagnosis not present

## 2017-06-15 DIAGNOSIS — G4730 Sleep apnea, unspecified: Secondary | ICD-10-CM | POA: Diagnosis not present

## 2017-06-15 DIAGNOSIS — R51 Headache: Secondary | ICD-10-CM | POA: Diagnosis not present

## 2017-06-15 DIAGNOSIS — Z8249 Family history of ischemic heart disease and other diseases of the circulatory system: Secondary | ICD-10-CM | POA: Diagnosis not present

## 2017-06-15 DIAGNOSIS — R700 Elevated erythrocyte sedimentation rate: Secondary | ICD-10-CM | POA: Diagnosis not present

## 2017-06-15 DIAGNOSIS — E785 Hyperlipidemia, unspecified: Secondary | ICD-10-CM | POA: Diagnosis not present

## 2017-06-15 DIAGNOSIS — M797 Fibromyalgia: Secondary | ICD-10-CM | POA: Diagnosis not present

## 2017-06-15 HISTORY — DX: Headache, unspecified: R51.9

## 2017-06-15 HISTORY — DX: Unspecified asthma, uncomplicated: J45.909

## 2017-06-15 HISTORY — DX: Headache: R51

## 2017-06-15 HISTORY — DX: Sleep apnea, unspecified: G47.30

## 2017-06-15 LAB — BASIC METABOLIC PANEL
Anion gap: 11 (ref 5–15)
BUN: 30 mg/dL — AB (ref 6–20)
CHLORIDE: 93 mmol/L — AB (ref 101–111)
CO2: 29 mmol/L (ref 22–32)
Calcium: 9 mg/dL (ref 8.9–10.3)
Creatinine, Ser: 1.43 mg/dL — ABNORMAL HIGH (ref 0.44–1.00)
GFR calc Af Amer: 44 mL/min — ABNORMAL LOW (ref >60)
GFR, EST NON AFRICAN AMERICAN: 38 mL/min — AB (ref >60)
GLUCOSE: 186 mg/dL — AB (ref 65–99)
POTASSIUM: 4.2 mmol/L (ref 3.5–5.1)
Sodium: 133 mmol/L — ABNORMAL LOW (ref 135–145)

## 2017-06-15 LAB — CBC
HEMATOCRIT: 37.7 % (ref 36.0–46.0)
HEMOGLOBIN: 12.1 g/dL (ref 12.0–15.0)
MCH: 27.1 pg (ref 26.0–34.0)
MCHC: 32.1 g/dL (ref 30.0–36.0)
MCV: 84.3 fL (ref 78.0–100.0)
Platelets: 173 10*3/uL (ref 150–400)
RBC: 4.47 MIL/uL (ref 3.87–5.11)
RDW: 15.6 % — ABNORMAL HIGH (ref 11.5–15.5)
WBC: 19 10*3/uL — AB (ref 4.0–10.5)

## 2017-06-15 MED ORDER — VANCOMYCIN HCL 10 G IV SOLR
1500.0000 mg | INTRAVENOUS | Status: AC
  Administered 2017-06-16: 1500 mg via INTRAVENOUS
  Filled 2017-06-15: qty 1500

## 2017-06-15 NOTE — Progress Notes (Signed)
Anesthesia Chart Review:  Pt is a 66 year old female scheduled for R temporal artery biopsy on 06/16/2017 with Gretta Began, MD  - PCP is Devra Dopp, MD  PMH includes:  HTN, hyperlipidemia, anemia, sarcoidosis, polyclonal gammopathy, asthma, OSA. Never smoker. BMI 40  Medications include: Albuterol amlodipine, chlorthalidone, iron, advair, lasix, potassium, prednisone, seroquel, simvastatin  BP (!) 148/73   Pulse 100   Temp 36.8 C   Resp 18   Ht  (1.575 m)   Wt 217 lb 11.2 oz (98.7 kg)   SpO2 99%   BMI 39.82 kg/m   Preoperative labs reviewed.   - WBC 19.0. Pt taking prednisone. I left voicemail for RN in Dr. Bosie Helper office about elevated WBC.  - Cr 1.43, BUN 30. Pt's baseline Cr ~ 0.8-1.0.  Will route to PCP for f/u.    CXR 01/04/17:  1. Slight increase in a diffuse interstitial pattern. Mild edema is not excluded. 2. Central airway thickening compatible with reactive airways disease. 3. Mild bibasilar atelectasis.  CT angio chest 01/04/17:  - No definite evidence of pulmonary embolus. No acute abnormality seen in the chest.  EKG 01/04/17: NSR with sinus arrhythmia. LAD. Low voltage QRS  Cardiac cath 09/13/12:  1. Normal left main coronary artery. 2. Normal left anterior descending artery and its branches. 3. Normal left circumflex artery and its branches. 4. Normal right coronary artery. 5. Normal left ventricular systolic function.  LVEDP 18-23 mmHg.  Ejection fraction 55%. 6.  Elevated LVEDP c/w diastolic dysfunction  Echo 09/07/12:  - Left ventricle: The cavity size was normal. Systolic function was normal. The estimated ejection fraction was in the range of 55% to 60%. Wall motion was normal; there were no regional wall motion abnormalities.  If no changes, I anticipate pt can proceed with surgery as scheduled.   Rica Mast, FNP-BC [facility]/Anesthesiology Phone: [phone] 06/15/2017 4:04 PM

## 2017-06-15 NOTE — Progress Notes (Signed)
PCP - Dr. Devra Dopp  Cardiologist - Denies  Chest x-ray - 01/04/17 (E)  EKG - 01/04/17 (E)  Stress Test - 09/06/12 (E)  ECHO - 09/06/12 (E)  Cardiac Cath - 2014  Sleep Study - Yes CPAP - Yes- Does not know settings  Chart will be given to anesthesia for review due to cardiac history.  Pt denies having chest pain, sob, or fever at this time. All instructions explained to the pt, with a verbal understanding of the material. Pt agrees to go over the instructions while at home for a better understanding. The opportunity to ask questions was provided.

## 2017-06-15 NOTE — Anesthesia Preprocedure Evaluation (Addendum)
Anesthesia Evaluation  Patient identified by MRN, date of birth, ID band Patient awake    Reviewed: Allergy & Precautions, H&P , NPO status , Patient's Chart, lab work & pertinent test results  Airway Mallampati: III  TM Distance: >3 FB Neck ROM: Full    Dental  (+) Teeth Intact, Dental Advisory Given   Pulmonary neg pulmonary ROS, asthma , sleep apnea ,    breath sounds clear to auscultation       Cardiovascular Exercise Tolerance: Good hypertension, Pt. on medications and Pt. on home beta blockers negative cardio ROS   Rhythm:Regular Rate:Normal     Neuro/Psych  Headaches, Depression negative neurological ROS  negative psych ROS   GI/Hepatic negative GI ROS, Neg liver ROS,   Endo/Other  negative endocrine ROSMorbid obesity  Renal/GU negative Renal ROS  negative genitourinary   Musculoskeletal  (+) Arthritis , Osteoarthritis,  Fibromyalgia -  Abdominal (+) + obese,   Peds  Hematology negative hematology ROS (+) anemia ,   Anesthesia Other Findings   Reproductive/Obstetrics negative OB ROS                            Anesthesia Physical Anesthesia Plan  ASA: III  Anesthesia Plan: MAC   Post-op Pain Management:    Induction: Intravenous  PONV Risk Score and Plan: 4 or greater and Ondansetron, Dexamethasone, Midazolam and Propofol infusion  Airway Management Planned: Natural Airway and Simple Face Mask  Additional Equipment:   Intra-op Plan:   Post-operative Plan:   Informed Consent: I have reviewed the patients History and Physical, chart, labs and discussed the procedure including the risks, benefits and alternatives for the proposed anesthesia with the patient or authorized representative who has indicated his/her understanding and acceptance.   Dental advisory given  Plan Discussed with: CRNA  Anesthesia Plan Comments:       Anesthesia Quick Evaluation

## 2017-06-15 NOTE — Pre-Procedure Instructions (Signed)
SERIYAH COLLISON  06/15/2017      [id number] [place], [place] - [address] [place] [address] Phone: [phone] Fax: [phone]  [facility] - [place], [place] - [address] [place] [address] Phone: [phone] Fax: [phone]  [facility] [address], [place] - [address] [place] [address] Phone: [phone] Fax: [phone]    Your procedure is scheduled on Wednesday, June 16, 2017  Report to [facility] Admitting Entrance "A" at 8:30 A.M.   Call this number if you have problems the morning of surgery:  [phone]   Remember:  Do not eat food or drink liquids after midnight.  Take these medicines the morning of surgery with A SIP OF WATER: AmLODipine (NORVASC), PredniSONE (DELTASONE), Sertraline (ZOLOFT), Fluticasone-Salmeterol (ADVAIR), and Fluticasone (FLONASE). If needed Acetaminophen (TYLENOL) for mild pain and Albuterol inhaler for cough or wheezing (bring with you the day of surgery).  As of today, stop taking all Aspirins, Vitamins, Fish oils, and Herbal medications. Also stop all NSAIDS i.e. Advil, Ibuprofen, Motrin, Aleve, Anaprox, Naproxen, BC and Goody Powders. That includes: Ferrous Sulfate, meloxicam (MOBIC)    Do not wear jewelry, make-up or nail polish.  Do not wear lotions, powders, or perfumes, or deodorant.  Do not shave 48 hours prior to surgery.   Do not bring valuables to the hospital.  [facility] is not responsible for any belongings or valuables.  Contacts, dentures or bridgework may not be worn into surgery.  Leave your suitcase in the car.  After surgery it may be brought to your room.  For patients admitted to the hospital, discharge time will be determined by your treatment team.  Patients discharged the day of surgery will not be allowed to drive home.   Special instructions:   [facility]- Preparing For Surgery  Before surgery, you can play an important role. Because skin is not sterile, your skin needs to be as free of germs as possible. You can reduce the number of germs on your skin by washing with CHG (chlorahexidine gluconate) Soap before surgery.  CHG is an antiseptic cleaner which kills germs and bonds with the skin to continue killing germs even after washing.  Please do not use if you have an allergy to CHG or antibacterial soaps. If your skin becomes reddened/irritated stop using the CHG.  Do not shave (including legs and underarms) for at least 48 hours prior to first CHG shower. It is OK to shave your face.  Please follow these instructions carefully.   1. Shower the NIGHT BEFORE SURGERY and the MORNING OF SURGERY with CHG.   2. If you chose to wash your hair, wash your hair first as usual with your normal shampoo.  3. After you shampoo, rinse your hair and body thoroughly to remove the shampoo.  4. Use CHG as you would any other liquid soap. You can apply CHG directly to the skin and wash gently with a scrungie or a clean washcloth.   5. Apply the CHG Soap to your body ONLY FROM THE NECK DOWN.  Do not use on open wounds or open sores. Avoid contact with your eyes, ears, mouth and genitals (private parts). Wash Face and genitals (private parts)  with your normal soap.  6. Wash thoroughly, paying special attention to the area where your surgery will be performed.  7. Thoroughly rinse your body with warm  water from the neck down.  8. DO NOT shower/wash with your normal soap after using and rinsing off the CHG Soap.  9. Pat yourself dry with a CLEAN TOWEL.  10. Wear CLEAN PAJAMAS to bed the night before surgery, wear comfortable clothes the morning of surgery  11. Place CLEAN SHEETS on your bed the night of your first shower and DO NOT SLEEP WITH PETS.  Day of Surgery: Do not apply any deodorants/lotions. Please wear clean clothes to the  hospital/surgery center.   Please read over the following fact sheets that you were given.

## 2017-06-16 ENCOUNTER — Ambulatory Visit (HOSPITAL_COMMUNITY): Payer: Medicare Other | Admitting: Certified Registered Nurse Anesthetist

## 2017-06-16 ENCOUNTER — Encounter (HOSPITAL_COMMUNITY): Payer: Self-pay | Admitting: Urology

## 2017-06-16 ENCOUNTER — Encounter (HOSPITAL_COMMUNITY)
Admission: RE | Disposition: A | Discharge status: Home / Self Care | Payer: Self-pay | Source: Ambulatory Visit | Attending: Vascular Surgery

## 2017-06-16 ENCOUNTER — Ambulatory Visit (HOSPITAL_COMMUNITY)
Admission: RE | Admit: 2017-06-16 | Discharge: 2017-06-16 | Disposition: A | Discharge status: Home / Self Care | Payer: Medicare Other | Source: Ambulatory Visit | Attending: Vascular Surgery | Admitting: Vascular Surgery

## 2017-06-16 DIAGNOSIS — E785 Hyperlipidemia, unspecified: Secondary | ICD-10-CM | POA: Insufficient documentation

## 2017-06-16 DIAGNOSIS — G4730 Sleep apnea, unspecified: Secondary | ICD-10-CM | POA: Insufficient documentation

## 2017-06-16 DIAGNOSIS — F319 Bipolar disorder, unspecified: Secondary | ICD-10-CM | POA: Diagnosis not present

## 2017-06-16 DIAGNOSIS — J45909 Unspecified asthma, uncomplicated: Secondary | ICD-10-CM | POA: Insufficient documentation

## 2017-06-16 DIAGNOSIS — Z6839 Body mass index (BMI) 39.0-39.9, adult: Secondary | ICD-10-CM | POA: Insufficient documentation

## 2017-06-16 DIAGNOSIS — Z8249 Family history of ischemic heart disease and other diseases of the circulatory system: Secondary | ICD-10-CM | POA: Insufficient documentation

## 2017-06-16 DIAGNOSIS — M1990 Unspecified osteoarthritis, unspecified site: Secondary | ICD-10-CM | POA: Insufficient documentation

## 2017-06-16 DIAGNOSIS — R51 Headache: Secondary | ICD-10-CM | POA: Insufficient documentation

## 2017-06-16 DIAGNOSIS — M797 Fibromyalgia: Secondary | ICD-10-CM | POA: Insufficient documentation

## 2017-06-16 DIAGNOSIS — Z79899 Other long term (current) drug therapy: Secondary | ICD-10-CM | POA: Insufficient documentation

## 2017-06-16 DIAGNOSIS — Z880 Allergy status to penicillin: Secondary | ICD-10-CM | POA: Insufficient documentation

## 2017-06-16 DIAGNOSIS — R700 Elevated erythrocyte sedimentation rate: Secondary | ICD-10-CM | POA: Diagnosis not present

## 2017-06-16 DIAGNOSIS — Z885 Allergy status to narcotic agent status: Secondary | ICD-10-CM | POA: Insufficient documentation

## 2017-06-16 DIAGNOSIS — D869 Sarcoidosis, unspecified: Secondary | ICD-10-CM | POA: Insufficient documentation

## 2017-06-16 DIAGNOSIS — D890 Polyclonal hypergammaglobulinemia: Secondary | ICD-10-CM | POA: Insufficient documentation

## 2017-06-16 DIAGNOSIS — I10 Essential (primary) hypertension: Secondary | ICD-10-CM | POA: Insufficient documentation

## 2017-06-16 HISTORY — PX: ARTERY BIOPSY: SHX891

## 2017-06-16 LAB — POCT I-STAT 4, (NA,K, GLUC, HGB,HCT)
GLUCOSE: 162 mg/dL — AB (ref 65–99)
HEMATOCRIT: 39 % (ref 36.0–46.0)
Hemoglobin: 13.3 g/dL (ref 12.0–15.0)
Potassium: 4.9 mmol/L (ref 3.5–5.1)
Sodium: 134 mmol/L — ABNORMAL LOW (ref 135–145)

## 2017-06-16 SURGERY — BIOPSY TEMPORAL ARTERY
Anesthesia: Monitor Anesthesia Care | Site: Head | Laterality: Right

## 2017-06-16 MED ORDER — LIDOCAINE 2% (20 MG/ML) 5 ML SYRINGE
INTRAMUSCULAR | Status: AC
  Filled 2017-06-16: qty 5

## 2017-06-16 MED ORDER — LIDOCAINE-EPINEPHRINE 0.5 %-1:200000 IJ SOLN
INTRAMUSCULAR | Status: AC
  Filled 2017-06-16: qty 1

## 2017-06-16 MED ORDER — CALCIUM CHLORIDE 10 % IV SOLN
INTRAVENOUS | Status: AC
  Filled 2017-06-16: qty 20

## 2017-06-16 MED ORDER — 0.9 % SODIUM CHLORIDE (POUR BTL) OPTIME
TOPICAL | Status: DC | PRN
  Administered 2017-06-16: 1000 mL

## 2017-06-16 MED ORDER — LIDOCAINE-EPINEPHRINE 0.5 %-1:200000 IJ SOLN
INTRAMUSCULAR | Status: DC | PRN
  Administered 2017-06-16: 50 mL

## 2017-06-16 MED ORDER — SODIUM CHLORIDE 0.9 % IV SOLN: INTRAVENOUS | Status: DC

## 2017-06-16 MED ORDER — OXYCODONE-ACETAMINOPHEN 5-325 MG PO TABS: 1.0000 | ORAL_TABLET | Freq: Four times a day (QID) | ORAL | 0 refills | Status: DC | PRN

## 2017-06-16 MED ORDER — MIDAZOLAM HCL 5 MG/5ML IJ SOLN
INTRAMUSCULAR | Status: DC | PRN
  Administered 2017-06-16: 1 mg via INTRAVENOUS

## 2017-06-16 MED ORDER — DEXAMETHASONE SODIUM PHOSPHATE 10 MG/ML IJ SOLN
INTRAMUSCULAR | Status: AC
  Filled 2017-06-16: qty 1

## 2017-06-16 MED ORDER — ONDANSETRON HCL 4 MG/2ML IJ SOLN
INTRAMUSCULAR | Status: DC | PRN
  Administered 2017-06-16: 4 mg via INTRAVENOUS

## 2017-06-16 MED ORDER — PROPOFOL 500 MG/50ML IV EMUL
INTRAVENOUS | Status: DC | PRN
  Administered 2017-06-16: 25 ug/kg/min via INTRAVENOUS

## 2017-06-16 MED ORDER — PROPOFOL 10 MG/ML IV BOLUS
INTRAVENOUS | Status: AC
  Filled 2017-06-16: qty 40

## 2017-06-16 MED ORDER — MIDAZOLAM HCL 2 MG/2ML IJ SOLN
INTRAMUSCULAR | Status: AC
  Filled 2017-06-16: qty 2

## 2017-06-16 MED ORDER — FENTANYL CITRATE (PF) 100 MCG/2ML IJ SOLN: 25.0000 ug | INTRAMUSCULAR | Status: DC | PRN

## 2017-06-16 MED ORDER — LACTATED RINGERS IV SOLN
INTRAVENOUS | Status: DC
  Administered 2017-06-16: 09:00:00 via INTRAVENOUS

## 2017-06-16 MED ORDER — ONDANSETRON HCL 4 MG/2ML IJ SOLN
INTRAMUSCULAR | Status: AC
  Filled 2017-06-16: qty 2

## 2017-06-16 MED ORDER — DEXAMETHASONE SODIUM PHOSPHATE 10 MG/ML IJ SOLN
INTRAMUSCULAR | Status: DC | PRN
  Administered 2017-06-16: 5 mg via INTRAVENOUS

## 2017-06-16 MED ORDER — FENTANYL CITRATE (PF) 250 MCG/5ML IJ SOLN
INTRAMUSCULAR | Status: AC
  Filled 2017-06-16: qty 5

## 2017-06-16 SURGICAL SUPPLY — 38 items
ADH SKN CLS APL DERMABOND .7 (GAUZE/BANDAGES/DRESSINGS) ×1
BALL CTTN LRG ABS STRL LF (GAUZE/BANDAGES/DRESSINGS) ×1
CANISTER SUCT 3000ML PPV (MISCELLANEOUS) ×2 IMPLANT
CONT SPEC 4OZ CLIKSEAL STRL BL (MISCELLANEOUS) ×2 IMPLANT
COTTONBALL LRG STERILE PKG (GAUZE/BANDAGES/DRESSINGS) ×2 IMPLANT
COVER SURGICAL LIGHT HANDLE (MISCELLANEOUS) ×1 IMPLANT
DECANTER SPIKE VIAL GLASS SM (MISCELLANEOUS) ×2 IMPLANT
DERMABOND ADVANCED (GAUZE/BANDAGES/DRESSINGS) ×1
DERMABOND ADVANCED .7 DNX12 (GAUZE/BANDAGES/DRESSINGS) ×1 IMPLANT
DRAPE LAPAROTOMY T 102X78X121 (DRAPES) ×2 IMPLANT
ELECT REM PT RETURN 9FT ADLT (ELECTROSURGICAL) ×2
ELECTRODE REM PT RTRN 9FT ADLT (ELECTROSURGICAL) ×1 IMPLANT
GLOVE BIOGEL PI IND STRL 6.5 (GLOVE) IMPLANT
GLOVE BIOGEL PI IND STRL 7.0 (GLOVE) IMPLANT
GLOVE BIOGEL PI INDICATOR 6.5 (GLOVE) ×2
GLOVE BIOGEL PI INDICATOR 7.0 (GLOVE) ×1
GLOVE ECLIPSE 6.5 STRL STRAW (GLOVE) ×1 IMPLANT
GLOVE SS BIOGEL STRL SZ 7.5 (GLOVE) ×1 IMPLANT
GLOVE SUPERSENSE BIOGEL SZ 7.5 (GLOVE) ×1
GOWN STRL REUS W/ TWL LRG LVL3 (GOWN DISPOSABLE) ×3 IMPLANT
GOWN STRL REUS W/TWL LRG LVL3 (GOWN DISPOSABLE) ×4
KIT BASIN OR (CUSTOM PROCEDURE TRAY) ×2 IMPLANT
KIT ROOM TURNOVER OR (KITS) ×2 IMPLANT
LOOP VESSEL MAXI BLUE (MISCELLANEOUS) ×2 IMPLANT
NDL HYPO 25GX1X1/2 BEV (NEEDLE) ×1 IMPLANT
NEEDLE HYPO 25GX1X1/2 BEV (NEEDLE) ×2 IMPLANT
NS IRRIG 1000ML POUR BTL (IV SOLUTION) ×2 IMPLANT
PACK GENERAL/GYN (CUSTOM PROCEDURE TRAY) ×2 IMPLANT
PAD ARMBOARD 7.5X6 YLW CONV (MISCELLANEOUS) ×4 IMPLANT
SPONGE LAP 4X18 X RAY DECT (DISPOSABLE) ×2 IMPLANT
SUT SILK 3 0 (SUTURE) ×2
SUT SILK 3-0 18XBRD TIE 12 (SUTURE) ×1 IMPLANT
SUT SILK 4 0 TIES 17X18 (SUTURE) ×2 IMPLANT
SUT VIC AB 3-0 SH 27 (SUTURE)
SUT VIC AB 3-0 SH 27X BRD (SUTURE) ×1 IMPLANT
SUT VICRYL 4-0 PS2 18IN ABS (SUTURE) ×2 IMPLANT
SYR CONTROL 10ML LL (SYRINGE) ×2 IMPLANT
WATER STERILE IRR 1000ML POUR (IV SOLUTION) ×1 IMPLANT

## 2017-06-16 NOTE — Op Note (Signed)
    OPERATIVE REPORT  DATE OF SURGERY: 06/16/2017  PATIENT: Veronica Baker, 66 y.o. female MRN: [id number]  DOB: 02/25/1951  PRE-OPERATIVE DIAGNOSIS: Temporal arteritis symptoms  POST-OPERATIVE DIAGNOSIS:  Same  PROCEDURE: Right temporal artery biopsy  SURGEON:  Gretta Beganodd Alinda Egolf, M.D.  PHYSICIAN ASSISTANT: Nurse  ANESTHESIA: Local with sedation  EBL: 3 ml  Total I/O In: 400 [I.V.:400] Out: 3 [Blood:3]  BLOOD ADMINISTERED: None  DRAINS: None  SPECIMEN: Right temporal artery  COUNTS CORRECT:  YES  PLAN OF CARE: PACU  PATIENT DISPOSITION:  PACU - hemodynamically stable  PROCEDURE DETAILS: The patient was taken the operating placed supine position where the area of the preauricular area was prepped and draped in the usual sterile fashion.  Incision was made over this area longitudinally over the temporal artery pulsation.  This was carried down through the subcutaneous tissue and the temporal artery was exposed.  The artery was mobilized proximally and distally and approximately 3 cm of the artery.  The artery was ligated proximally and distally with 3-0 silk ties and divided.  The specimen was passed off for permanent specimen.  The wound was irrigated with saline and hemostasis with electrocautery.  Wound was closed with several 3-0 Vicryl sutures in the subcuticular tissue.  Skin was closed with a forcep particular Vicryl suture.  Sterile dressing was applied and the patient was transferred to the recovery in stable condition   Larina Earthlyodd F. Mark Benecke, M.D., [facility] 06/16/2017 1:21 PM

## 2017-06-16 NOTE — Interval H&P Note (Signed)
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 After consideration of risks, benefits and other options for treatment, the patient has consented to  Procedure(s): BIOPSY TEMPORAL ARTERY RIGHT (Right) as a surgical intervention .  The patient's history has been reviewed, patient examined, no change in status, stable for surgery.  I have reviewed the patient's chart and labs.  Questions were answered to the patient's satisfaction.     Gretta BeganEarly, Britteny Fiebelkorn

## 2017-06-16 NOTE — Transfer of Care (Signed)
Immediate Anesthesia Transfer of Care Note  Patient: Veronica Baker  Procedure(s) Performed: BIOPSY TEMPORAL ARTERY RIGHT (Right Head)  Patient Location: PACU  Anesthesia Type:MAC  Level of Consciousness: awake, alert  and oriented  Airway & Oxygen Therapy: Patient Spontanous Breathing  Post-op Assessment: Report given to RN and Post -op Vital signs reviewed and stable  Post vital signs: Reviewed and stable  Last Vitals:  Vitals:   06/16/17 0849 06/16/17 1037  BP:  139/87  Pulse:    Resp: 20 14  Temp:  (!) 36.3 C  SpO2:  99%    Last Pain:  Vitals:   06/16/17 1037  PainSc: 0-No pain         Complications: No apparent anesthesia complications

## 2017-06-16 NOTE — Progress Notes (Signed)
Patient ready for discharge in phase 2. [facility] Alert and oriented x 4 VSS, denies pain, sitting up in recliner. Ready for discharge when family arrives.

## 2017-06-16 NOTE — Anesthesia Procedure Notes (Signed)
Procedure Name: MAC Performed by: Valda Favia Pre-anesthesia Checklist: Patient identified, Emergency Drugs available, Suction available, Patient being monitored and Timeout performed Oxygen Delivery Method: Simple face mask Placement Confirmation: positive ETCO2 Dental Injury: Teeth and Oropharynx as per pre-operative assessment

## 2017-06-16 NOTE — H&P (View-Only) (Signed)
[facility] [place]  Patient name: Veronica Baker MRN: [id number] DOB: [birth year] Sex: female  REASON FOR CONSULT: Biopsy to evaluate possible temporal arteritis  HPI: Veronica Baker is a 66 y.o. female, who is seen today for scheduling biopsy for possible temporal arteritis. She was having visual symptoms and was seen by her ophthalmologist to was concerned about potential diagnosis of temporal arteritis. She did have some tenderness over her right temporal region. She tells me that she is continuing to have persistent headaches. Has been placed on steroids and did have an elevated sedimentation rate. No prior history of arteritis. 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 History of sarcoidosis as well.  Past Medical History:  Diagnosis Date  . Anemia   . Arthritis   . Blood dyscrasia    polyclonal gamopathy  . Depression    Bipolar  . Fibromyalgia   . Hyperlipidemia   . Hypertension   . Sarcoidosis     Family History  Problem Relation Age of Onset  . Hypertension Brother   . Coronary artery disease Brother   . Asthma Brother     SOCIAL HISTORY: Social History   Social History  . Marital status: Divorced    Spouse name: N/A  . Number of children: N/A  . Years of education: N/A   Occupational History  . Not on file.   Social History Main Topics  . Smoking status: Never Smoker  . Smokeless tobacco: Never Used  . Alcohol use No  . Drug use: No  . Sexual activity: Not on file   Other Topics Concern  . Not on file   Social History Narrative  . No narrative on file    Allergies  Allergen Reactions  . Tramadol Nausea Only  . Penicillins Rash    Current Outpatient Prescriptions  Medication Sig Dispense Refill  . acetaminophen (TYLENOL) 500 MG tablet Take 500 mg by mouth every 6 (six) hours as needed for mild pain.    Marland Kitchen albuterol (PROVENTIL  HFA;VENTOLIN HFA) 108 (90 Base) MCG/ACT inhaler Inhale 1-2 puffs into the lungs every 6 (six) hours as needed for wheezing or shortness of breath. 1 Inhaler 0  . amLODipine (NORVASC) 2.5 MG tablet Take 2.5 mg by mouth daily.    . benzonatate (TESSALON) 100 MG capsule Take 1 capsule (100 mg total) by mouth 3 (three) times daily as needed for cough. 21 capsule 0  . chlorthalidone (HYGROTON) 25 MG tablet Take 25 mg by mouth daily.    . Ferrous Sulfate 27 MG TABS Take 1 tablet by mouth daily.    . fluticasone (FLONASE) 50 MCG/ACT nasal spray Place 1 spray into both nostrils daily as needed for allergies or rhinitis.    . Fluticasone-Salmeterol (ADVAIR) 250-50 MCG/DOSE AEPB Inhale 1 puff into the lungs 2 (two) times daily.    . furosemide (LASIX) 40 MG tablet Take 1 tablet (40 mg total) by mouth 2 (two) times daily. (Patient taking differently: Take 20 mg by mouth daily as needed for fluid. ) 30 tablet 11  . meloxicam (MOBIC) 15 MG tablet Take 15 mg by mouth daily as needed for pain.    . Multiple Vitamin (MULTIVITAMIN) tablet Take 1 tablet by mouth daily.    Bertram Gala Glycol-Propyl Glycol (SYSTANE) 0.4-0.3 % SOLN Apply 1 drop to eye 4 (four) times daily.    . potassium chloride SA (K-DUR,KLOR-CON) 20 MEQ tablet Take 20 mEq  by mouth daily.    . predniSONE (DELTASONE) 20 MG tablet Take by mouth.    . QUEtiapine (SEROQUEL) 300 MG tablet Take 300 mg by mouth at bedtime.    . sertraline (ZOLOFT) 50 MG tablet Take 200 mg by mouth daily.     . simvastatin (ZOCOR) 20 MG tablet Take 20 mg by mouth daily.    . metoprolol succinate (TOPROL XL) 25 MG 24 hr tablet Take 1 tablet (25 mg total) by mouth daily. (Patient not taking: Reported on 06/01/2017) 30 tablet 11  . risperiDONE (RISPERDAL) 2 MG tablet Take 2 mg by mouth daily.     No current facility-administered medications for this visit.     REVIEW OF SYSTEMS:  [X]  denotes positive finding, [ ]  denotes negative finding Cardiac  Comments:  Chest pain or  chest pressure:    Shortness of breath upon exertion: x   Short of breath when lying flat:    Irregular heart rhythm:        Vascular    Pain in calf, thigh, or hip brought on by ambulation: x   Pain in feet at night that wakes you up from your sleep:  x   Blood clot in your veins:    Leg swelling:  x       Pulmonary    Oxygen at home:    Productive cough:     Wheezing:         Neurologic    Sudden weakness in arms or legs:     Sudden numbness in arms or legs:     Sudden onset of difficulty speaking or slurred speech:    Temporary loss of vision in one eye:     Problems with dizziness:         Gastrointestinal    Blood in stool:     Vomited blood:         Genitourinary    Burning when urinating:     Blood in urine:        Psychiatric    Major depression:         Hematologic    Bleeding problems:    Problems with blood clotting too easily: x       Skin    Rashes or ulcers:        Constitutional    Fever or chills:      PHYSICAL EXAM: Vitals:   06/01/17 1459  BP: 140/86  Pulse: 86  Resp: 16  Temp: 97.9 F (36.6 C)  TempSrc: Oral  SpO2: 97%  Weight: 222 lb 12.8 oz (101.1 kg)  Height: 5\' 2"  (1.575 m)    GENERAL: The patient is a well-nourished female, in no acute distress. The vital signs are documented above. CARDIOVASCULAR: 2+ radial pulses bilaterally. Palpable right temporal artery with some tenderness over this. No tenderness over her left temporal artery PULMONARY: There is good air exchange  ABDOMEN: Soft and non-tender  MUSCULOSKELETAL: There are no major deformities or cyanosis. NEUROLOGIC: No focal weakness or paresthesias are detected. SKIN: There are no ulcers or rashes noted. PSYCHIATRIC: The patient has a normal affect.  DATA:  None to review  MEDICAL ISSUES: Discussed my role in the potential diagnosis for temporal arteritis. Have recommended right temporal artery biopsy as an outpatient. She reports that she is leaving on a cruise is  Saturday and wants to defer until after she is return on October 14. We will schedule this at her earliest convenience. She understands the  location of the incision just in front of her ear with subcuticular closure and that it would take several days for the pathology determination of temporal arteritis   Maddoxx Burkitt F. Danay Mckellar, MD FACS [facility] [place] Office Tel [phone] Pager [phone]    

## 2017-06-16 NOTE — Anesthesia Postprocedure Evaluation (Signed)
Anesthesia Post Note  Patient: COZETTE BRAGGS  Procedure(s) Performed: BIOPSY TEMPORAL ARTERY RIGHT (Right Head)     Patient location during evaluation: PACU Anesthesia Type: MAC Level of consciousness: awake and alert Pain management: pain level controlled Vital Signs Assessment: post-procedure vital signs reviewed and stable Respiratory status: spontaneous breathing, nonlabored ventilation and respiratory function stable Cardiovascular status: stable and blood pressure returned to baseline Postop Assessment: no apparent nausea or vomiting Anesthetic complications: no    Last Vitals:  Vitals:   06/16/17 1102 06/16/17 1107  BP:  115/84  Pulse: 88 86  Resp: 18 16  Temp:    SpO2: 92% 93%    Last Pain:  Vitals:   06/16/17 1115  PainSc: Asleep                 Lira Stephen,W. EDMOND

## 2017-06-17 ENCOUNTER — Encounter (HOSPITAL_COMMUNITY): Payer: Self-pay | Admitting: Vascular Surgery

## 2018-05-09 DIAGNOSIS — E7800 Pure hypercholesterolemia, unspecified: Secondary | ICD-10-CM | POA: Insufficient documentation

## 2018-08-15 DIAGNOSIS — H9202 Otalgia, left ear: Secondary | ICD-10-CM | POA: Insufficient documentation

## 2018-08-15 DIAGNOSIS — K148 Other diseases of tongue: Secondary | ICD-10-CM | POA: Insufficient documentation

## 2018-08-28 ENCOUNTER — Encounter: Payer: Self-pay | Admitting: Emergency Medicine

## 2018-08-28 DIAGNOSIS — F259 Schizoaffective disorder, unspecified: Secondary | ICD-10-CM | POA: Insufficient documentation

## 2018-08-29 ENCOUNTER — Ambulatory Visit: Payer: Self-pay | Admitting: Psychiatry

## 2018-09-07 ENCOUNTER — Ambulatory Visit: Payer: Medicare Other | Admitting: Psychiatry

## 2018-09-07 ENCOUNTER — Encounter: Payer: Self-pay | Admitting: Psychiatry

## 2018-09-07 DIAGNOSIS — F251 Schizoaffective disorder, depressive type: Secondary | ICD-10-CM | POA: Diagnosis not present

## 2018-09-07 NOTE — Progress Notes (Signed)
[name] [id number] [birth year] 68 y.o.  Subjective:   Patient ID:  Veronica Baker is a 68 y.o. (DOB Jun 12, 1951) female.  Chief Complaint:  Chief Complaint  Patient presents with  . Follow-up    Medication management    HPI  Not seen in a year.  Last seen 06/28/17 [name] presents to the office today for follow-up of schizoaffective disorder.  CO nausea from sertraline.  Depressed every now and then bc gets lonely.  Not consistent.  Not hearing voices.  Sleep alright. Fell asleep on toilet last month at night and hit her head.  Not drowsy daytime.  She had taken flexeril at night.  She stopped the sertraline in October and has felt fine without it.  Emotionally feels ok.  Patient reports stable mood and denies depressed or irritable moods.  Patient denies any recent difficulty with anxiety.  Patient denies difficulty with sleep initiation or maintenance. 7-8 hours., Denies appetite disturbance.  Patient reports that energy and motivation have been good.  Patient denies any difficulty with concentration.  Patient denies any suicidal ideation.    Review of Systems:  Review of Systems  Musculoskeletal: Positive for back pain.       Walks with cane  Neurological: Positive for weakness. Negative for tremors.  Psychiatric/Behavioral: Positive for suicidal ideas. Negative for agitation, behavioral problems, confusion, decreased concentration, dysphoric mood, hallucinations, self-injury and sleep disturbance. The patient is not nervous/anxious and is not hyperactive.     Medications: I have reviewed the patient's current medications.  Current Outpatient Medications  Medication Sig Dispense Refill  . acetaminophen (TYLENOL) 500 MG tablet Take 500 mg by mouth every 6 (six) hours as needed for mild pain.    Marland Kitchen albuterol (PROVENTIL HFA;VENTOLIN HFA) 108 (90 Base) MCG/ACT inhaler Inhale 1-2 puffs into the lungs every 6 (six) hours as needed for wheezing or shortness of breath.  (Patient taking differently: Inhale 2 puffs into the lungs every 6 (six) hours as needed for wheezing or shortness of breath. ) 1 Inhaler 0  . atorvastatin (LIPITOR) 40 MG tablet Take by mouth.    . chlorthalidone (HYGROTON) 25 MG tablet Take 25 mg by mouth daily.    . cyclobenzaprine (FLEXERIL) 10 MG tablet Take 10 mg by mouth 3 (three) times daily as needed for muscle spasms.    . Ferrous Sulfate 27 MG TABS Take 27 mg by mouth daily.     . fluticasone (FLONASE) 50 MCG/ACT nasal spray Place 1 spray into both nostrils daily.     . Fluticasone-Salmeterol (ADVAIR) 250-50 MCG/DOSE AEPB Inhale 1 puff into the lungs 2 (two) times daily.    . furosemide (LASIX) 40 MG tablet Take 1 tablet (40 mg total) by mouth 2 (two) times daily. (Patient taking differently: Take 20 mg by mouth daily. ) 30 tablet 11  . meloxicam (MOBIC) 15 MG tablet Take 15 mg by mouth daily as needed for pain.    . Multiple Vitamin (MULTIVITAMIN) tablet Take 1 tablet by mouth daily.    . Potassium Gluconate 550 MG TABS Take 550 mg by mouth daily.    . QUEtiapine (SEROQUEL) 300 MG tablet Take 300 mg by mouth at bedtime.    . simvastatin (ZOCOR) 20 MG tablet Take 20 mg by mouth daily.    Marland Kitchen amLODipine (NORVASC) 2.5 MG tablet Take 2.5 mg by mouth daily.    Marland Kitchen oxyCODONE-acetaminophen (ROXICET) 5-325 MG tablet Take 1 tablet by mouth every 6 (six) hours as needed for moderate  pain. (Patient not taking: Reported on 09/07/2018) 4 tablet 0  . Polyethyl Glycol-Propyl Glycol (SYSTANE) 0.4-0.3 % SOLN Place 2 drops into both eyes as needed (for dry eyes).     . predniSONE (DELTASONE) 20 MG tablet Take 20 mg by mouth 2 (two) times daily.      No current facility-administered medications for this visit.     Medication Side Effects: Sedation from pain meds. Nausea with sertraline.  Allergies:  Allergies  Allergen Reactions  . Tramadol Nausea Only  . Penicillins Rash    Past Medical History:  Diagnosis Date  . Anemia   . Arthritis   .  Asthma   . Blood dyscrasia    polyclonal gamopathy  . Depression    Bipolar  . Fibromyalgia   . Headache   . Hyperlipidemia   . Hypertension   . Sarcoidosis   . Sleep apnea     Family History  Problem Relation Age of Onset  . Hypertension Brother   . Coronary artery disease Brother   . Asthma Brother     Social History   Socioeconomic History  . Marital status: Divorced    Spouse name: Not on file  . Number of children: Not on file  . Years of education: Not on file  . Highest education level: Not on file  Occupational History  . Not on file  Social Needs  . Financial resource strain: Not on file  . Food insecurity:    Worry: Not on file    Inability: Not on file  . Transportation needs:    Medical: Not on file    Non-medical: Not on file  Tobacco Use  . Smoking status: Never Smoker  . Smokeless tobacco: Never Used  Substance and Sexual Activity  . Alcohol use: No  . Drug use: No  . Sexual activity: Not on file  Lifestyle  . Physical activity:    Days per week: Not on file    Minutes per session: Not on file  . Stress: Not on file  Relationships  . Social connections:    Talks on phone: Not on file    Gets together: Not on file    Attends religious service: Not on file    Active member of club or organization: Not on file    Attends meetings of clubs or organizations: Not on file    Relationship status: Not on file  . Intimate partner violence:    Fear of current or ex partner: Not on file    Emotionally abused: Not on file    Physically abused: Not on file    Forced sexual activity: Not on file  Other Topics Concern  . Not on file  Social History Narrative  . Not on file    Past Medical History, Surgical history, Social history, and Family history were reviewed and updated as appropriate.   Please see review of systems for further details on the patient's review from today.   Objective:   Physical Exam:  There were no vitals taken for this  visit.  Physical Exam Constitutional:      General: She is not in acute distress.    Appearance: She is well-developed.  Musculoskeletal:        General: No deformity.  Neurological:     Mental Status: She is alert and oriented to person, place, and time.     Motor: No tremor.     Coordination: Coordination normal.     Gait: Gait normal.  Psychiatric:        Attention and Perception: Attention and perception normal.        Mood and Affect: Mood is not anxious or depressed. Affect is not labile, blunt, angry or inappropriate.        Behavior: Behavior normal.        Thought Content: Thought content is not paranoid. Thought content does not include homicidal or suicidal ideation. Thought content does not include homicidal or suicidal plan.        Cognition and Memory: Cognition normal.        Judgment: Judgment normal.     Comments: Insight intact. No auditory or visual hallucinations. No delusions.  Speech style is chronically somewhat difficult to understand.     Lab Review:     Component Value Date/Time   NA 134 (L) 06/16/2017 0901   K 4.9 06/16/2017 0901   CL 93 (L) 06/15/2017 1437   CO2 29 06/15/2017 1437   GLUCOSE 162 (H) 06/16/2017 0901   BUN 30 (H) 06/15/2017 1437   CREATININE 1.43 (H) 06/15/2017 1437   CALCIUM 9.0 06/15/2017 1437   PROT 6.9 01/04/2017 1317   ALBUMIN 3.2 (L) 01/04/2017 1317   AST 23 01/04/2017 1317   ALT 14 01/04/2017 1317   ALKPHOS 95 01/04/2017 1317   BILITOT 0.4 01/04/2017 1317   GFRNONAA 38 (L) 06/15/2017 1437   GFRAA 44 (L) 06/15/2017 1437       Component Value Date/Time   WBC 19.0 (H) 06/15/2017 1437   RBC 4.47 06/15/2017 1437   HGB 13.3 06/16/2017 0901   HGB 11.7 09/03/2010 1143   HCT 39.0 06/16/2017 0901   HCT 35.6 09/03/2010 1143   PLT 173 06/15/2017 1437   PLT 189 09/03/2010 1143   MCV 84.3 06/15/2017 1437   MCV 82.4 09/03/2010 1143   MCH 27.1 06/15/2017 1437   MCHC 32.1 06/15/2017 1437   RDW 15.6 (H) 06/15/2017 1437   RDW  12.9 09/03/2010 1143   LYMPHSABS 2.8 07/18/2011 1137   LYMPHSABS 2.8 09/03/2010 1143   MONOABS 0.5 07/18/2011 1137   MONOABS 0.5 09/03/2010 1143   EOSABS 0.2 07/18/2011 1137   EOSABS 0.2 09/03/2010 1143   BASOSABS 0.0 07/18/2011 1137   BASOSABS 0.0 09/03/2010 1143    No results found for: POCLITH, LITHIUM   No results found for: PHENYTOIN, PHENOBARB, VALPROATE, CBMZ   .res Assessment: Plan:    Schizoaffective disorder, depressive type (HCC)   Aram BeechamCynthia is doing well off the sertraline which she stopped in October due to nausea.  The nausea resolved.  She has had no worsening depression or anxiety.  She continues on the Seroquel.  We discussed fall precautions and taking precautions against excessive sleepiness when she gets up in the middle of the night particularly when she takes muscle relaxer medicines and pain meds.  Otherwise she seems to be tolerating the medications well and the hallucinations are under control.  She is not manic nor depressed.  We will make no med changes.  Discussed potential metabolic side effects associated with atypical antipsychotics, as well as potential risk for movement side effects. Advised pt to contact office if movement side effects occur.   Follow-up 6 months  Meredith Staggersarey Cottle, MD, DFAPA   Please see After Visit Summary for patient specific instructions.  No future appointments.  No orders of the defined types were placed in this encounter.     -------------------------------

## 2018-10-14 ENCOUNTER — Ambulatory Visit: Payer: Medicare Other | Admitting: Rehabilitative and Restorative Service Providers"

## 2018-10-19 ENCOUNTER — Ambulatory Visit: Payer: Medicare Other | Attending: Nurse Practitioner | Admitting: Physical Therapy

## 2018-10-19 DIAGNOSIS — M6281 Muscle weakness (generalized): Secondary | ICD-10-CM | POA: Diagnosis present

## 2018-10-19 DIAGNOSIS — R262 Difficulty in walking, not elsewhere classified: Secondary | ICD-10-CM | POA: Diagnosis not present

## 2018-10-19 NOTE — Therapy (Signed)
[facility] [address] [place], [place], [address] Phone: [phone]   Fax:  [phone]  Physical Therapy Evaluation  Patient Details  Name: Veronica Baker MRN: [id number] Date of Birth: 08/22/1951 Referring Provider (PT): Azzie RoupShane Anderson FNP   Encounter Date: 10/19/2018  PT End of Session - 10/19/18 1617    Visit Number  1    Number of Visits  4    Date for PT Re-Evaluation  12/19/18    Authorization Type  [employer] medicare    PT Start Time  1530    PT Stop Time  1615    PT Time Calculation (min)  45 min    Equipment Utilized During Treatment  Gait belt    Activity Tolerance  Patient tolerated treatment well    Behavior During Therapy  Impulsive;WFL for tasks assessed/performed       Past Medical History:  Diagnosis Date  . Anemia   . Arthritis   . Asthma   . Blood dyscrasia    polyclonal gamopathy  . Depression    Bipolar  . Fibromyalgia   . Headache   . Hyperlipidemia   . Hypertension   . Sarcoidosis   . Sleep apnea     Past Surgical History:  Procedure Laterality Date  . ABDOMINAL HYSTERECTOMY    . APPENDECTOMY    . ARTERY BIOPSY Right 06/16/2017   Procedure: BIOPSY TEMPORAL ARTERY RIGHT;  Surgeon: Larina EarthlyEarly, Todd F, MD;  [facility];  Service: Vascular;  Laterality: Right;  . bilateral oophorectomy    . BREAST SURGERY    . CARDIAC CATHETERIZATION    . COLON SURGERY     colonscopy  . HERNIA REPAIR    . TUBAL LIGATION    . UMBILICAL HERNIA REPAIR      There were no vitals filed for this visit.   Subjective Assessment - 10/19/18 1534    Subjective  Pt has had several falls ; fell in August hit her head,  fell in January off her commode hit her head on tub; she fell asleep ; she doesn't know why she looses balance; has multi site pain ; feet and back are main problems; mostly her back; as she relaxed and we did the balance and gait assessment she was moving with much more freedom.      Pertinent History  bipolar  hx multi site pain    Limitations  Standing;Walking    How long can you sit comfortably?  no limit     How long can you stand comfortably?  not sure maybe 15 minutes    How long can you walk comfortably?  can walk around [employer] with a cane     Patient Stated Goals  stop falling     Currently in Pain?  Yes    Pain Score  5     Pain Location  Foot    Pain Orientation  Left;Right    Pain Descriptors / Indicators  Burning    Pain Type  Chronic pain;Post Delivery (post partum)    Pain Onset  More than a month ago    Pain Frequency  Intermittent    Aggravating Factors   walking helps a little     Pain Relieving Factors  medication; support stockings ; elevation      Effect of Pain on Daily Activities  usually burns at night     Multiple Pain Sites  Yes    Pain Score  5  Pain Location  Back    Pain Descriptors / Indicators  Aching    Pain Type  Chronic pain    Pain Radiating Towards  no mention of radiating pain    Pain Onset  More than a month ago    Pain Frequency  Intermittent    Aggravating Factors   getting up and down    Pain Relieving Factors  walking     Effect of Pain on Daily Activities  transfers are painful         [facility] PT Assessment - 10/19/18 0001      Assessment   Medical Diagnosis  lack of coordiation;     Referring Provider (PT)  Azzie RoupShane Anderson FNP      Precautions   Precautions  Fall      Restrictions   Weight Bearing Restrictions  No      Balance Screen   Has the patient fallen in the past 6 months  Yes    How many times?  3    Has the patient had a decrease in activity level because of a fear of falling?   Yes    Is the patient reluctant to leave their home because of a fear of falling?   Yes      Prior Function   Level of Independence  Independent with basic ADLs      Cognition   Overall Cognitive Status  History of cognitive impairments - at baseline    Attention  Focused    Awareness  Appears intact       Coordination   Gross Motor Movements are Fluid and Coordinated  Yes    Fine Motor Movements are Fluid and Coordinated  Yes      Posture/Postural Control   Posture/Postural Control  Postural limitations    Postural Limitations  Anterior pelvic tilt;Flexed trunk      ROM / Strength   AROM / PROM / Strength  AROM      AROM   Overall AROM   Within functional limits for tasks performed      Transfers   Transfers  Sit to Stand    Sit to Stand  6: Modified independent (Device/Increase time)      Ambulation/Gait   Ambulation/Gait  Yes    Ambulation/Gait Assistance  6: Modified independent (Device/Increase time)    Ambulation Distance (Feet)  490 Feet    Assistive device  None    Gait Pattern  Step-through pattern;Decreased stride length    Ambulation Surface  Level;Indoor    Gait velocity  2.0 ft/sec      Balance   Balance Assessed  Yes      Standardized Balance Assessment   Standardized Balance Assessment  Berg Balance Test;Timed Up and Go Test      Berg Balance Test   Sit to Stand  Able to stand without using hands and stabilize independently    Standing Unsupported  Able to stand safely 2 minutes    Sitting with Back Unsupported but Feet Supported on Floor or Stool  Able to sit safely and securely 2 minutes    Stand to Sit  Controls descent by using hands    Transfers  Able to transfer safely, definite need of hands    Standing Unsupported with Eyes Closed  Able to stand 10 seconds safely    Standing Ubsupported with Feet Together  Able to place feet together independently and stand 1 minute safely    From Standing, Reach Forward  with Outstretched Arm  Can reach forward >12 cm safely (5")    From Standing Position, Pick up Object from Floor  Unable to pick up shoe, but reaches 2-5 cm (1-2") from shoe and balances independently    From Standing Position, Turn to Look Behind Over each Shoulder  Looks behind one side only/other side shows less weight shift    Turn 360 Degrees   Able to turn 360 degrees safely one side only in 4 seconds or less    Standing Unsupported, Alternately Place Feet on Step/Stool  Able to stand independently and complete 8 steps >20 seconds    Standing Unsupported, One Foot in Front  Able to plae foot ahead of the other independently and hold 30 seconds    Standing on One Leg  Able to lift leg independently and hold 5-10 seconds    Total Score  46      Timed Up and Go Test   TUG  Normal TUG    Normal TUG (seconds)  12        MODCTSIB;   4/4  anticipatory stepping forward , back and side; ; demo'd step reflex but needed several small steps to recover balance   Pt walked in with can and an antalgic gait pattern;  When we walked the 490' in the gym she had no cane and did great, good gait pattern and 2 ft/sec ; no loss of balance   Gross MMT and ROM; no asymetry noted; good strength and range throughout;   Main deficit with balance testing was picking up an object off the floor; she lost her balance and needed support to recover;         Objective measurements completed on examination: See above findings.                PT Short Term Goals - 10/19/18 1626      PT SHORT TERM GOAL #1   Title  Pt to be follow recommended HEP daily  10 out of next 14 days.     Baseline  no HEP    Time  2    Period  Weeks        PT Long Term Goals - 10/19/18 1627      PT LONG TERM GOAL #1   Title  Pt will be independent in daily home exericse program for 30 minutes a day. Pt to return demo the program on last PT visit     Time  8    Period  Weeks    Status  New      PT LONG TERM GOAL #2   Title  Patient to demonstrate low fall risk evident on >54 on BERG , no new falls and any additional balance assesement performed at future visits, for safe community activity    Time  8    Period  Weeks      PT LONG TERM GOAL #3   Title  Patient will be able to tolerate 30 minutes of continuous walking w/o use of assisted device and no  loss of balance w/o s/s of excessive fatigue     Time  8    Period  Weeks    Status  New             Plan - 10/19/18 1637    PT Home Exercise Plan   Access Code: [email or web address]     Consulted and Agree with Plan of Care  Patient  Patient will benefit from skilled therapeutic intervention in order to improve the following deficits and impairments:  Abnormal gait, Decreased balance, Decreased endurance, Decreased mobility, Difficulty walking, Improper body mechanics, Decreased activity tolerance, Decreased coordination, Decreased knowledge of use of DME, Decreased safety awareness, Pain  Visit Diagnosis: Difficulty in walking, not elsewhere classified  Muscle weakness (generalized)     Problem List Patient Active Problem List   Diagnosis Date Noted  . Schizoaffective disorder (HCC) 08/28/2018  . Abnormal cardiovascular stress test 09/13/2012  . Diastolic dysfunction 09/13/2012  . SOB (shortness of breath) 09/06/2012    Vashti Hey D  PT DPT  10/19/2018, 4:38 PM  [facility] [address] [place], [place], [address] Phone: [phone]   Fax:  [phone]  Name: Veronica Baker MRN: [id number] Date of Birth: [birth year]

## 2018-10-19 NOTE — Patient Instructions (Signed)
Access Code: [email or web address]  URL: [email or web address]  Date: 10/19/2018  Prepared by: Iona Hansen, DPT   Exercises  Quadruped Transversus Abdominis Bracing - 10 reps - 3 sets - 1x daily - 7x weekly  Bird Dog - 10 reps - 3 sets - 1x daily - 7x weekly  Sit to Stand - 10 reps - 3 sets - 1x daily - 7x weekly  Tandem Walking with Counter Support - 10 reps - 3 sets - 1x daily - 7x weekly  Side Stepping with Counter Support - 10 reps - 3 sets - 1x daily - 7x weekly  Standing Single Leg Stance with Unilateral Counter Support - 10 reps - 3 sets - 1x daily - 7x weekly  Step Up - 10 reps - 3 sets - 1x daily - 7x weekly  Standing Balance with Eyes Closed on Foam - 10 reps - 3 sets - 1x daily - 7x weekly  Standing Balance with Diagonal Ball Lift - 10 reps - 3 sets - 1x daily - 7x weekly

## 2018-10-31 ENCOUNTER — Encounter: Payer: Self-pay | Admitting: Physical Therapy

## 2018-10-31 ENCOUNTER — Ambulatory Visit: Payer: Medicare Other | Attending: Nurse Practitioner | Admitting: Physical Therapy

## 2018-10-31 DIAGNOSIS — M6281 Muscle weakness (generalized): Secondary | ICD-10-CM | POA: Diagnosis present

## 2018-10-31 DIAGNOSIS — R262 Difficulty in walking, not elsewhere classified: Secondary | ICD-10-CM | POA: Insufficient documentation

## 2018-10-31 NOTE — Patient Instructions (Signed)
Access Code: [email or web address]  URL: [email or web address]  Date: 10/31/2018  Prepared by: Vladimir Faster   Exercises  Supine Posterior Pelvic Tilt - 10 reps - 1 sets - 5 seconds hold - 1x daily - 7x weekly  Supine Transversus Abdominis Bracing with Double Leg Fallout - 10 reps - 1 sets - 5 seconds hold - 1x daily - 7x weekly  Supine Transversus Abdominis Bracing with Heel Slide - 10 reps - 1 sets - 5 seconds hold - 1x daily - 5x weekly  Squat with Chair and Counter Support - 10 reps - 1 sets - 5 seconds hold - 1x daily - 7x weekly  Side Stepping with Counter Support - 10 reps - 1 sets - 5 seconds hold - 1x daily - 7x weekly  Tandem Walking with Counter Support - 10 reps - 1 sets - 5 seconds hold - 1x daily - 7x weekly  Single Leg Stance - 10 reps - 1 sets - 10 seconds hold - 1x daily - 7x weekly  Heel Raise - 10 reps - 1 sets - 5 seconds hold - 1x daily - 7x weekly  Heel Toe Raises with Counter Support - 10 reps - 1 sets - 5 seconds hold - 1x daily - 7x weekly

## 2018-10-31 NOTE — Therapy (Signed)
[facility] [address] [place], [place], [address] Phone: [phone]   Fax:  [phone]  Physical Therapy Treatment  Patient Details  Name: Veronica Baker MRN: [id number] Date of Birth: August 09, 1951 Referring Provider (PT): Azzie Roup FNP   Encounter Date: 10/31/2018  PT End of Session - 10/31/18 1410    Visit Number  2    Number of Visits  4    Date for PT Re-Evaluation  12/19/18    Authorization Type  [employer] medicare    PT Start Time  1320    PT Stop Time  1405    PT Time Calculation (min)  45 min    Equipment Utilized During Treatment  Gait belt    Activity Tolerance  Patient tolerated treatment well    Behavior During Therapy  Impulsive;WFL for tasks assessed/performed       Past Medical History:  Diagnosis Date  . Anemia   . Arthritis   . Asthma   . Blood dyscrasia    polyclonal gamopathy  . Depression    Bipolar  . Fibromyalgia   . Headache   . Hyperlipidemia   . Hypertension   . Sarcoidosis   . Sleep apnea     Past Surgical History:  Procedure Laterality Date  . ABDOMINAL HYSTERECTOMY    . APPENDECTOMY    . ARTERY BIOPSY Right 06/16/2017   Procedure: BIOPSY TEMPORAL ARTERY RIGHT;  Surgeon: Larina Earthly, MD;  Location: [facility] OR;  Service: Vascular;  Laterality: Right;  . bilateral oophorectomy    . BREAST SURGERY    . CARDIAC CATHETERIZATION    . COLON SURGERY     colonscopy  . HERNIA REPAIR    . TUBAL LIGATION    . UMBILICAL HERNIA REPAIR      There were no vitals filed for this visit.  Subjective Assessment - 10/31/18 1320    Subjective  No falls. She did not get the exercises on computer so unable to do them.     Pertinent History  bipolar  hx multi site pain    Limitations  Standing;Walking    How long can you sit comfortably?  no limit     How long can you stand comfortably?  not sure maybe 15 minutes    How long can you walk comfortably?  can walk around [employer] with a  cane     Patient Stated Goals  stop falling     Currently in Pain?  Yes    Pain Score  5    in last week, worst 6/10, best 0/10   Pain Location  Leg   calf up to hip   Pain Orientation  Left    Pain Descriptors / Indicators  Burning    Pain Type  Chronic pain    Pain Onset  More than a month ago    Pain Frequency  Intermittent    Aggravating Factors   getting in/out bed,     Pain Relieving Factors  pain medications    Pain Onset  More than a month ago       PT demo, instructed in supine to/from sit via sidelying to decrease stress on back & pain.  PT also instructed in using pillows to position in sidelying.  See patient instructions  SciFit recumbent stepper Level 1 with BUEs & BLEs 5 minutes. PT explained rationale for using this machine at [facility]. Pt verbalized understanding.  PT Education - 10/31/18 1355    Education Details  HEP see pt instructions    Person(s) Educated  Patient    Methods  Explanation;Demonstration;Tactile cues;Verbal cues;Handout    Comprehension  Verbalized understanding;Returned demonstration;Verbal cues required;Tactile cues required;Need further instruction       PT Short Term Goals - 10/19/18 1626      PT SHORT TERM GOAL #1   Title  Pt to be follow recommended HEP daily  10 out of next 14 days.     Baseline  no HEP    Time  2    Period  Weeks        PT Long Term Goals - 10/19/18 1627      PT LONG TERM GOAL #1   Title  Pt will be independent in daily home exericse program for 30 minutes a day. Pt to return demo the program on last PT visit     Time  8    Period  Weeks    Status  New      PT LONG TERM GOAL #2   Title  Patient to demonstrate low fall risk evident on >54 on BERG , no new falls and any additional balance assesement performed at future visits, for safe community activity    Time  8    Period  Weeks      PT LONG TERM GOAL #3   Title  Patient will be able to tolerate 30 minutes of  continuous walking w/o use of assisted device and no loss of balance w/o s/s of excessive fatigue     Time  8    Period  Weeks    Status  New            Plan - 10/31/18 2134    Clinical Impression Statement  Patient appears to understand initial HEP and PT recommendation to add recumbent stepper to exercise program at [facility]. PT also instructed in technique for supine to/from sit via sidelying to decrease back pain.     Rehab Potential  Good    PT Frequency  Other (comment)    PT Duration  8 weeks    PT Treatment/Interventions  Gait training;Stair training;Functional mobility training;Neuromuscular re-education;Balance training;Therapeutic exercise;Therapeutic activities;Manual techniques;Vestibular    PT Next Visit Plan  review initial HEP and perform Dynamic Gait Index with cane    Consulted and Agree with Plan of Care  Patient       Patient will benefit from skilled therapeutic intervention in order to improve the following deficits and impairments:  Abnormal gait, Decreased balance, Decreased endurance, Decreased mobility, Difficulty walking, Improper body mechanics, Decreased activity tolerance, Decreased coordination, Decreased knowledge of use of DME, Decreased safety awareness, Pain  Visit Diagnosis: Difficulty in walking, not elsewhere classified  Muscle weakness (generalized)     Problem List Patient Active Problem List   Diagnosis Date Noted  . Schizoaffective disorder (HCC) 08/28/2018  . Abnormal cardiovascular stress test 09/13/2012  . Diastolic dysfunction 09/13/2012  . SOB (shortness of breath) 09/06/2012    Carletta Feasel PT, DPT 10/31/2018, 9:40 PM  [facility] [address] [place], [place], [address] Phone: [phone]   Fax:  [phone]  Name: DOVE CROSSNO MRN: [id number] Date of Birth: 04/04/1951

## 2018-11-16 ENCOUNTER — Ambulatory Visit: Payer: Medicare Other | Admitting: Physical Therapy

## 2018-11-16 ENCOUNTER — Encounter: Payer: Self-pay | Admitting: Gastroenterology

## 2018-11-21 ENCOUNTER — Encounter: Payer: Self-pay | Admitting: Physical Therapy

## 2018-11-21 NOTE — Therapy (Signed)
[facility] [address] [place], [place], [address] Phone: [phone]   Fax:  [phone]  Patient Details  Name: Veronica Baker MRN: [id number] Date of Birth: 17-Apr-1951 Referring Provider:  Azzie Roup, FNP  Encounter Date: 11/21/2018  [facility] was contacted today regarding the temporary closing of [facility] due to Covid-19.  Therapist discussed:  Care once center reopens.   Patient is not interested in further information for an e-visit, virtual check in, or telehealth visit, if those services become available.    [facility] will follow up with patients when we are able to resume care.  Vladimir Faster, PT, DPT [facility] [address] [place], [place]  [address] Phone:  [phone] Fax:  [phone] Vladimir Faster PT, DPT 11/21/2018, 12:47 PM  [facility] [address] [place], [place], [address] Phone: [phone]   Fax:  [phone]

## 2018-11-29 ENCOUNTER — Ambulatory Visit: Payer: Medicare Other | Admitting: Physical Therapy

## 2018-12-13 ENCOUNTER — Ambulatory Visit: Payer: Medicare Other | Admitting: Physical Therapy

## 2019-02-06 ENCOUNTER — Other Ambulatory Visit: Payer: Self-pay

## 2019-02-06 MED ORDER — QUETIAPINE FUMARATE 300 MG PO TABS: 300.0000 mg | ORAL_TABLET | Freq: Every day | ORAL | 0 refills | Status: DC

## 2019-02-15 ENCOUNTER — Ambulatory Visit: Payer: Medicare Other | Admitting: Physical Therapy

## 2019-02-22 DIAGNOSIS — G8929 Other chronic pain: Secondary | ICD-10-CM | POA: Insufficient documentation

## 2019-02-22 DIAGNOSIS — I160 Hypertensive urgency: Secondary | ICD-10-CM | POA: Insufficient documentation

## 2019-03-08 ENCOUNTER — Ambulatory Visit: Payer: Medicare Other | Admitting: Psychiatry

## 2019-04-03 ENCOUNTER — Encounter: Payer: Self-pay | Admitting: Psychiatry

## 2019-04-03 ENCOUNTER — Encounter (INDEPENDENT_AMBULATORY_CARE_PROVIDER_SITE_OTHER): Payer: Self-pay

## 2019-04-03 ENCOUNTER — Ambulatory Visit (INDEPENDENT_AMBULATORY_CARE_PROVIDER_SITE_OTHER): Payer: Medicare Other | Admitting: Psychiatry

## 2019-04-03 ENCOUNTER — Other Ambulatory Visit: Payer: Self-pay

## 2019-04-03 DIAGNOSIS — F251 Schizoaffective disorder, depressive type: Secondary | ICD-10-CM

## 2019-04-03 DIAGNOSIS — F411 Generalized anxiety disorder: Secondary | ICD-10-CM | POA: Diagnosis not present

## 2019-04-03 MED ORDER — CITALOPRAM HYDROBROMIDE 20 MG PO TABS: 20.0000 mg | ORAL_TABLET | Freq: Every day | ORAL | 0 refills | Status: DC

## 2019-04-03 NOTE — Progress Notes (Signed)
Veronica LeatherwoodCynthia S Igoe [id number] 12/15/1950 68 y.o.  Subjective:   Patient ID:  Veronica Baker is a 68 y.o. (DOB 07/27/1951) female.  Chief Complaint:  Chief Complaint  Patient presents with  . Follow-up    Medication Management  . Other    Schizoeffective Disorder  . Stress    HPI   Veronica LeatherwoodCynthia S Sapien presents to the office today for follow-up of schizoaffective disorder.  Last seen in January 2020.  No meds were changed.  She had stopped sertraline October 2019 due to nausea.  She found the nausea to resolve.  No worsening mood symptoms.  Fine until stress with friend.  Had to cut some people off.  Feels hurt.  Worst of it in July.  Sleep worse with increased stress since July with EMA.  Talks with sister and now that's alright.  Other friend talks too much when she calls.  Hasn't been able to get to church which was a support.    Depressed every now and then bc gets lonely.  Not consistent.  Not hearing voices.  Sleep alright. Fell asleep on toilet last month at night and hit her head.  Not drowsy daytime.  She had taken flexeril at night.  She stopped the sertraline in October and has felt fine without it.  Emotionally feels ok.  Patient reports stable mood and denies irritable moods.  Is more depressed and anxious off sertraline and wants to start something.  Patient denies difficulty with sleep initiation or maintenance. 7-8 hours., Denies appetite disturbance.  Patient reports that energy and motivation have been good.  Patient denies any difficulty with concentration.  Patient denies any suicidal ideation.  Past Psychiatric Medication Trials: Sertraline 200 nausea, Seroquel 300, lamotrigine 100, risperidone 6, trazodone  Review of Systems:  Review of Systems  Musculoskeletal: Positive for back pain.       Walks with cane  Neurological: Positive for weakness. Negative for tremors.  Psychiatric/Behavioral: Negative for agitation, behavioral problems, confusion, decreased  concentration, dysphoric mood, hallucinations, self-injury, sleep disturbance and suicidal ideas. The patient is not nervous/anxious and is not hyperactive.     Medications: I have reviewed the patient's current medications.  Current Outpatient Medications  Medication Sig Dispense Refill  . acetaminophen (TYLENOL) 500 MG tablet Take 500 mg by mouth every 6 (six) hours as needed for mild pain.    Marland Kitchen. albuterol (PROVENTIL HFA;VENTOLIN HFA) 108 (90 Base) MCG/ACT inhaler Inhale 1-2 puffs into the lungs every 6 (six) hours as needed for wheezing or shortness of breath. (Patient taking differently: Inhale 2 puffs into the lungs every 6 (six) hours as needed for wheezing or shortness of breath. ) 1 Inhaler 0  . amLODipine (NORVASC) 2.5 MG tablet Take 2.5 mg by mouth daily.    Marland Kitchen. atorvastatin (LIPITOR) 40 MG tablet Take by mouth.    . chlorthalidone (HYGROTON) 25 MG tablet Take 25 mg by mouth daily.    . cyclobenzaprine (FLEXERIL) 10 MG tablet Take 10 mg by mouth 3 (three) times daily as needed for muscle spasms.    . Ferrous Sulfate 27 MG TABS Take 27 mg by mouth daily.     . fluticasone (FLONASE) 50 MCG/ACT nasal spray Place 1 spray into both nostrils daily.     . Fluticasone-Salmeterol (ADVAIR) 250-50 MCG/DOSE AEPB Inhale 1 puff into the lungs 2 (two) times daily.    . furosemide (LASIX) 40 MG tablet Take 1 tablet (40 mg total) by mouth 2 (two) times daily. (Patient taking differently: Take  20 mg by mouth daily. ) 30 tablet 11  . meloxicam (MOBIC) 15 MG tablet Take 15 mg by mouth daily as needed for pain.    . Multiple Vitamin (MULTIVITAMIN) tablet Take 1 tablet by mouth daily.    . Potassium Gluconate 550 MG TABS Take 550 mg by mouth daily.    . simvastatin (ZOCOR) 20 MG tablet Take 20 mg by mouth daily.    . QUEtiapine (SEROQUEL) 300 MG tablet Take 1 tablet (300 mg total) by mouth at bedtime. (Patient not taking: Reported on 04/03/2019) 90 tablet 0   No current facility-administered medications for  this visit.     Medication Side Effects: Sedation from pain meds.   Allergies:  Allergies  Allergen Reactions  . Penicillins Rash  . Tramadol Nausea Only    Past Medical History:  Diagnosis Date  . Anemia   . Arthritis   . Asthma   . Blood dyscrasia    polyclonal gamopathy  . Depression    Bipolar  . Fibromyalgia   . Headache   . Hyperlipidemia   . Hypertension   . Sarcoidosis   . Sleep apnea     Family History  Problem Relation Age of Onset  . Hypertension Brother   . Coronary artery disease Brother   . Asthma Brother     Social History   Socioeconomic History  . Marital status: Divorced    Spouse name: Not on file  . Number of children: Not on file  . Years of education: Not on file  . Highest education level: Not on file  Occupational History  . Not on file  Social Needs  . Financial resource strain: Not on file  . Food insecurity    Worry: Not on file    Inability: Not on file  . Transportation needs    Medical: Not on file    Non-medical: Not on file  Tobacco Use  . Smoking status: Never Smoker  . Smokeless tobacco: Never Used  Substance and Sexual Activity  . Alcohol use: No  . Drug use: No  . Sexual activity: Not on file  Lifestyle  . Physical activity    Days per week: Not on file    Minutes per session: Not on file  . Stress: Not on file  Relationships  . Social Musicianconnections    Talks on phone: Not on file    Gets together: Not on file    Attends religious service: Not on file    Active member of club or organization: Not on file    Attends meetings of clubs or organizations: Not on file    Relationship status: Not on file  . Intimate partner violence    Fear of current or ex partner: Not on file    Emotionally abused: Not on file    Physically abused: Not on file    Forced sexual activity: Not on file  Other Topics Concern  . Not on file  Social History Narrative  . Not on file    Past Medical History, Surgical history,  Social history, and Family history were reviewed and updated as appropriate.   Please see review of systems for further details on the patient's review from today.   Objective:   Physical Exam:  There were no vitals taken for this visit.  Physical Exam Constitutional:      General: She is not in acute distress.    Appearance: She is well-developed. She is obese.  Musculoskeletal:  General: No deformity.  Neurological:     Mental Status: She is alert and oriented to person, place, and time.     Motor: No tremor.     Coordination: Coordination normal.     Gait: Gait normal.  Psychiatric:        Attention and Perception: Attention and perception normal.        Mood and Affect: Mood is anxious and depressed. Affect is not labile, blunt, angry or inappropriate.        Behavior: Behavior normal.        Thought Content: Thought content is not paranoid. Thought content does not include homicidal or suicidal ideation. Thought content does not include homicidal or suicidal plan.        Cognition and Memory: Cognition normal.        Judgment: Judgment normal.     Comments: Insight intact. No auditory or visual hallucinations. No delusions.  Speech style is chronically somewhat difficult to understand.     Lab Review:     Component Value Date/Time   NA 134 (L) 06/16/2017 0901   K 4.9 06/16/2017 0901   CL 93 (L) 06/15/2017 1437   CO2 29 06/15/2017 1437   GLUCOSE 162 (H) 06/16/2017 0901   BUN 30 (H) 06/15/2017 1437   CREATININE 1.43 (H) 06/15/2017 1437   CALCIUM 9.0 06/15/2017 1437   PROT 6.9 01/04/2017 1317   ALBUMIN 3.2 (L) 01/04/2017 1317   AST 23 01/04/2017 1317   ALT 14 01/04/2017 1317   ALKPHOS 95 01/04/2017 1317   BILITOT 0.4 01/04/2017 1317   GFRNONAA 38 (L) 06/15/2017 1437   GFRAA 44 (L) 06/15/2017 1437       Component Value Date/Time   WBC 19.0 (H) 06/15/2017 1437   RBC 4.47 06/15/2017 1437   HGB 13.3 06/16/2017 0901   HGB 11.7 09/03/2010 1143   HCT 39.0  06/16/2017 0901   HCT 35.6 09/03/2010 1143   PLT 173 06/15/2017 1437   PLT 189 09/03/2010 1143   MCV 84.3 06/15/2017 1437   MCV 82.4 09/03/2010 1143   MCH 27.1 06/15/2017 1437   MCHC 32.1 06/15/2017 1437   RDW 15.6 (H) 06/15/2017 1437   RDW 12.9 09/03/2010 1143   LYMPHSABS 2.8 07/18/2011 1137   LYMPHSABS 2.8 09/03/2010 1143   MONOABS 0.5 07/18/2011 1137   MONOABS 0.5 09/03/2010 1143   EOSABS 0.2 07/18/2011 1137   EOSABS 0.2 09/03/2010 1143   BASOSABS 0.0 07/18/2011 1137   BASOSABS 0.0 09/03/2010 1143    No results found for: POCLITH, LITHIUM   No results found for: PHENYTOIN, PHENOBARB, VALPROATE, CBMZ   .res Assessment: Plan:    Izabell was seen today for follow-up, other and stress.  Diagnoses and all orders for this visit:  Schizoaffective disorder, depressive type ([place])  Generalized anxiety disorder   Arnita is doing worse with depression and anxiety.  Covid isolation is playing a role as is recent stressors with friends.  She's too isolated.   She continues on the Seroquel.  We discussed fall precautions and taking precautions against excessive sleepiness when she gets up in the middle of the night particularly when she takes muscle relaxer medicines and pain meds.  Otherwise she seems to be tolerating the medications well and the hallucinations are under control.  She is not manic nor depressed.  We will make no med changes.  Discussed potential metabolic side effects associated with atypical antipsychotics, as well as potential risk for movement side effects. Advised pt to  contact office if movement side effects occur.   Follow-up 3 months  Meredith Staggersarey Cottle, MD, DFAPA   Please see After Visit Summary for patient specific instructions.  No future appointments.  No orders of the defined types were placed in this encounter.     -------------------------------

## 2019-04-30 ENCOUNTER — Other Ambulatory Visit: Payer: Self-pay | Admitting: Psychiatry

## 2019-05-11 DIAGNOSIS — I509 Heart failure, unspecified: Secondary | ICD-10-CM | POA: Insufficient documentation

## 2019-05-22 ENCOUNTER — Other Ambulatory Visit: Payer: Self-pay | Admitting: Psychiatry

## 2019-05-22 DIAGNOSIS — F251 Schizoaffective disorder, depressive type: Secondary | ICD-10-CM

## 2019-05-22 DIAGNOSIS — F411 Generalized anxiety disorder: Secondary | ICD-10-CM

## 2019-06-14 ENCOUNTER — Emergency Department (HOSPITAL_COMMUNITY)
Admission: EM | Admit: 2019-06-14 | Discharge: 2019-06-14 | Disposition: A | Discharge status: Home / Self Care | Payer: Medicare Other | Attending: Emergency Medicine | Admitting: Emergency Medicine

## 2019-06-14 ENCOUNTER — Other Ambulatory Visit: Payer: Self-pay

## 2019-06-14 ENCOUNTER — Encounter (HOSPITAL_COMMUNITY): Payer: Self-pay

## 2019-06-14 DIAGNOSIS — I5032 Chronic diastolic (congestive) heart failure: Secondary | ICD-10-CM | POA: Insufficient documentation

## 2019-06-14 DIAGNOSIS — I110 Hypertensive heart disease with heart failure: Secondary | ICD-10-CM | POA: Insufficient documentation

## 2019-06-14 DIAGNOSIS — T7840XA Allergy, unspecified, initial encounter: Secondary | ICD-10-CM | POA: Insufficient documentation

## 2019-06-14 DIAGNOSIS — Z79899 Other long term (current) drug therapy: Secondary | ICD-10-CM | POA: Diagnosis not present

## 2019-06-14 DIAGNOSIS — J45909 Unspecified asthma, uncomplicated: Secondary | ICD-10-CM | POA: Diagnosis not present

## 2019-06-14 DIAGNOSIS — L299 Pruritus, unspecified: Secondary | ICD-10-CM | POA: Diagnosis present

## 2019-06-14 MED ORDER — PREDNISONE 20 MG PO TABS: 40.0000 mg | ORAL_TABLET | Freq: Every day | ORAL | 0 refills | Status: DC

## 2019-06-14 MED ORDER — PREDNISONE 20 MG PO TABS
40.0000 mg | ORAL_TABLET | Freq: Once | ORAL | Status: AC
  Administered 2019-06-14: 40 mg via ORAL
  Filled 2019-06-14: qty 2

## 2019-06-14 NOTE — ED Notes (Signed)
Patient verbalizes understanding of discharge instructions . Opportunity for questions and answers were provided . Armband removed by staff ,Pt discharged from ED. W/C  offered at D/C  and Declined W/C at D/C and was escorted to lobby by RN.  

## 2019-06-14 NOTE — ED Triage Notes (Signed)
Patient complains of eye swelling after using hair dye over the weekend. Took 2 benadryl yesterday. No other associated symptoms

## 2019-06-14 NOTE — Discharge Instructions (Addendum)
Please read attached information. If you experience any new or worsening signs or symptoms please return to the emergency room for evaluation. Please follow-up with your primary care provider or specialist as discussed. Please use medication prescribed only as directed and discontinue taking if you have any concerning signs or symptoms.   °

## 2019-06-14 NOTE — ED Provider Notes (Signed)
[facility] EMERGENCY DEPARTMENT Provider Note   CSN: [id number] Arrival date & time: 06/14/19  5465     History   Chief Complaint Chief Complaint  Patient presents with  . reaction to hair dye    HPI Veronica Baker is a 68 y.o. female.     HPI   38 YOF presents today with complaints of scalp itching and eye swelling.  Patient notes that on Saturday she had a hair dye put in her hair.  She notes shortly after she developed some itching to her scalp and edema to the right eye.  She denies any fever, drainage.  She does note she has had allergic reaction similar previously.  She notes she is not diabetic.  When she opens her eye she is able to see clearly, no discharge from the eye.  Past Medical History:  Diagnosis Date  . Anemia   . Arthritis   . Asthma   . Blood dyscrasia    polyclonal gamopathy  . Depression    Bipolar  . Fibromyalgia   . Headache   . Hyperlipidemia   . Hypertension   . Sarcoidosis   . Sleep apnea     Patient Active Problem List   Diagnosis Date Noted  . Schizoaffective disorder ([place]) 08/28/2018  . Abnormal cardiovascular stress test 09/13/2012  . Diastolic dysfunction 03/54/6568  . SOB (shortness of breath) 09/06/2012    Past Surgical History:  Procedure Laterality Date  . ABDOMINAL HYSTERECTOMY    . APPENDECTOMY    . ARTERY BIOPSY Right 06/16/2017   Procedure: BIOPSY TEMPORAL ARTERY RIGHT;  Surgeon: Rosetta Posner, MD;  Location: [facility];  Service: Vascular;  Laterality: Right;  . bilateral oophorectomy    . BREAST SURGERY    . CARDIAC CATHETERIZATION    . COLON SURGERY     colonscopy  . HERNIA REPAIR    . TUBAL LIGATION    . UMBILICAL HERNIA REPAIR       OB History   No obstetric history on file.      Home Medications    Prior to Admission medications   Medication Sig Start Date End Date Taking? Authorizing Provider  acetaminophen (TYLENOL) 500 MG tablet Take 500 mg by mouth every 6 (six) hours as  needed for mild pain.    [provider]  albuterol (PROVENTIL HFA;VENTOLIN HFA) 108 (90 Base) MCG/ACT inhaler Inhale 1-2 puffs into the lungs every 6 (six) hours as needed for wheezing or shortness of breath. Patient taking differently: Inhale 2 puffs into the lungs every 6 (six) hours as needed for wheezing or shortness of breath.  01/04/17   Bettey Costa, PA  amLODipine (NORVASC) 2.5 MG tablet Take 2.5 mg by mouth daily.    [provider]  atorvastatin (LIPITOR) 40 MG tablet Take by mouth. 05/10/18   [provider]  chlorthalidone (HYGROTON) 25 MG tablet Take 25 mg by mouth daily.    [provider]  citalopram (CELEXA) 20 MG tablet Take 1 tablet (20 mg total) by mouth daily. 04/03/19   Cottle, Billey Co., MD  cyclobenzaprine (FLEXERIL) 10 MG tablet Take 10 mg by mouth 3 (three) times daily as needed for muscle spasms.    [provider]  Ferrous Sulfate 27 MG TABS Take 27 mg by mouth daily.     [provider]  fluticasone (FLONASE) 50 MCG/ACT nasal spray Place 1 spray into both nostrils daily.     [provider]  Fluticasone-Salmeterol (ADVAIR) 250-50 MCG/DOSE AEPB Inhale 1 puff into the lungs 2 (two) times daily.    [provider]  furosemide (LASIX) 40 MG tablet Take 1 tablet (40 mg total) by mouth 2 (two) times daily. Patient taking differently: Take 20 mg by mouth daily.  09/13/12   Quintella Reichert, MD  meloxicam (MOBIC) 15 MG tablet Take 15 mg by mouth daily as needed for pain.    [provider]  Multiple Vitamin (MULTIVITAMIN) tablet Take 1 tablet by mouth daily.    [provider]  Potassium Gluconate 550 MG TABS Take 550 mg by mouth daily.    [provider]  predniSONE (DELTASONE) 20 MG tablet Take 2 tablets (40 mg total) by mouth daily. 06/14/19   Lucienne Sawyers, Tinnie Gens, PA-C  QUEtiapine (SEROQUEL) 300 MG tablet TAKE 1 TABLET BY MOUTH AT  BEDTIME 05/01/19   Cottle, Steva Ready., MD   simvastatin (ZOCOR) 20 MG tablet Take 20 mg by mouth daily.    [provider]    Family History Family History  Problem Relation Age of Onset  . Hypertension Brother   . Coronary artery disease Brother   . Asthma Brother     Social History Social History   Tobacco Use  . Smoking status: Never Smoker  . Smokeless tobacco: Never Used  Substance Use Topics  . Alcohol use: No  . Drug use: No     Allergies   Penicillins and Tramadol   Review of Systems Review of Systems  All other systems reviewed and are negative.   Physical Exam Updated Vital Signs BP 139/81 (BP Location: Right Arm)   Pulse 91   Temp 98 F (36.7 C) (Oral)   Resp 20   SpO2 99%   Physical Exam Vitals signs and nursing note reviewed.  Constitutional:      Appearance: She is well-developed.  HENT:     Head: Normocephalic and atraumatic.     Comments: Minor redness noted to the scalp, no drainage-edema noted to the right periocular soft tissue, no redness warmth to touch, no drainage from the eye, pupil equal round reactive light no bulbar or palpebral conjunctival injection-extraocular movements intact and pain-free Eyes:     General: No scleral icterus.       Right eye: No discharge.        Left eye: No discharge.     Conjunctiva/sclera: Conjunctivae normal.     Pupils: Pupils are equal, round, and reactive to light.  Neck:     Musculoskeletal: Normal range of motion.     Vascular: No JVD.     Trachea: No tracheal deviation.  Pulmonary:     Effort: Pulmonary effort is normal.     Breath sounds: No stridor.  Neurological:     Mental Status: She is alert and oriented to person, place, and time.     Coordination: Coordination normal.  Psychiatric:        Behavior: Behavior normal.        Thought Content: Thought content normal.        Judgment: Judgment normal.     ED Treatments / Results  Labs (all labs ordered are listed, but only abnormal results are displayed) Labs  Reviewed - No data to display  EKG None  Radiology No results found.  Procedures Procedures (including critical care time)  Medications Ordered in ED Medications  predniSONE (DELTASONE) tablet 40 mg (has no administration in time range)     Initial Impression /  Assessment and Plan / ED Course  I have reviewed the triage vital signs and the nursing notes.  Pertinent labs & imaging results that were available during my care of the patient were reviewed by me and considered in my medical decision making (see chart for details).        68 year old female presents today with allergic reaction.  This is likely secondary to the hair dye.  She has no signs of infectious etiology.  She does have edema to the eye but again it is not red no discharge no signs of infection at this time.  Patient encouraged to use steroids as directed, antihistamines, follow-up immediately if any new or worsening signs or symptoms present or follow-up with primary care if symptoms persist.  She is not diabetic or have any contraindications to steroids at this time.  Verbalized understanding and agreement to today's plan had no further questions or concerns at time of discharge.  Final Clinical Impressions(s) / ED Diagnoses   Final diagnoses:  Allergic reaction, initial encounter    ED Discharge Orders         Ordered    predniSONE (DELTASONE) 20 MG tablet  Daily     06/14/19 1218           Eyvonne MechanicHedges, Jamel Dunton, PA-C 06/14/19 1220    Benjiman CorePickering, Nathan, MD 06/14/19 1550

## 2019-06-23 ENCOUNTER — Other Ambulatory Visit: Payer: Self-pay

## 2019-06-23 DIAGNOSIS — Z20822 Contact with and (suspected) exposure to covid-19: Secondary | ICD-10-CM

## 2019-06-24 LAB — NOVEL CORONAVIRUS, NAA: SARS-CoV-2, NAA: NOT DETECTED

## 2019-07-04 ENCOUNTER — Other Ambulatory Visit: Payer: Self-pay

## 2019-07-04 ENCOUNTER — Encounter: Payer: Self-pay | Admitting: Psychiatry

## 2019-07-04 ENCOUNTER — Ambulatory Visit (INDEPENDENT_AMBULATORY_CARE_PROVIDER_SITE_OTHER): Payer: Medicare Other | Admitting: Psychiatry

## 2019-07-04 DIAGNOSIS — F411 Generalized anxiety disorder: Secondary | ICD-10-CM | POA: Diagnosis not present

## 2019-07-04 DIAGNOSIS — F251 Schizoaffective disorder, depressive type: Secondary | ICD-10-CM | POA: Diagnosis not present

## 2019-07-04 MED ORDER — CITALOPRAM HYDROBROMIDE 20 MG PO TABS: 20.0000 mg | ORAL_TABLET | Freq: Every day | ORAL | 1 refills | Status: DC

## 2019-07-04 MED ORDER — QUETIAPINE FUMARATE 300 MG PO TABS: 300.0000 mg | ORAL_TABLET | Freq: Every day | ORAL | 3 refills | Status: DC

## 2019-07-04 NOTE — Progress Notes (Signed)
Veronica LeatherwoodCynthia S Blouch [id number] 05/14/1951 68 y.o.   Virtual Visit via WebEX  I connected with pt by WebEx and verified that I am speaking with the correct person using two identifiers.   I discussed the limitations, risks, security and privacy concerns of performing an evaluation and management service by [facility] and the availability of in person appointments. I also discussed with the patient that there may be a patient responsible charge related to this service. The patient expressed understanding and agreed to proceed.  I discussed the assessment and treatment plan with the patient. The patient was provided an opportunity to ask questions and all were answered. The patient agreed with the plan and demonstrated an understanding of the instructions.   The patient was advised to call back or seek an in-person evaluation if the symptoms worsen or if the condition fails to improve as anticipated.  I provided 30 minutes of video time during this encounter. The call started at 0915 and ended at 9:30. The patient was located at home and the provider was located office.   Subjective:   Patient ID:  Veronica Baker is a 68 y.o. (DOB 10/16/1950) female.  Chief Complaint:  Chief Complaint  Patient presents with  . Follow-up    Medication Management  . Other    Schizoaffective disorder, depressive type    HPI   Veronica LeatherwoodCynthia S Buswell presents to the office today for follow-up of schizoaffective disorder.  When seen in January 2020.  No meds were changed.  She had stopped sertraline October 2019 due to nausea.  She found the nausea to resolve.  No worsening mood symptoms.  Last seen April 03, 2019.  No meds were changed.  Pleased with meds.  Much better with citalopram.  No SE.  Depression is much better.  Worked on boundaries and better self control in relationships.  Sleep is better also.  Patient reports stable mood and denies irritable moods.  Is more depressed and anxious off sertraline and  wants to start something.  Patient denies difficulty with sleep initiation or maintenance. 7-8 hours., Denies appetite disturbance.  Patient reports that energy and motivation have been good.  Patient denies any difficulty with concentration.  Patient denies any suicidal ideation.  Past Psychiatric Medication Trials: Sertraline 200 nausea, Seroquel 300, lamotrigine 100, risperidone 6, trazodone, citalopram.  Review of Systems:  Review of Systems  Musculoskeletal: Positive for back pain.       Walks with cane  Neurological: Negative for tremors and weakness.  Psychiatric/Behavioral: Negative for agitation, behavioral problems, confusion, decreased concentration, dysphoric mood, hallucinations, self-injury, sleep disturbance and suicidal ideas. The patient is not nervous/anxious and is not hyperactive.     Medications: I have reviewed the patient's current medications.  Current Outpatient Medications  Medication Sig Dispense Refill  . acetaminophen (TYLENOL) 500 MG tablet Take 500 mg by mouth every 6 (six) hours as needed for mild pain.    Marland Kitchen. albuterol (PROVENTIL HFA;VENTOLIN HFA) 108 (90 Base) MCG/ACT inhaler Inhale 1-2 puffs into the lungs every 6 (six) hours as needed for wheezing or shortness of breath. (Patient taking differently: Inhale 2 puffs into the lungs every 6 (six) hours as needed for wheezing or shortness of breath. ) 1 Inhaler 0  . amLODipine (NORVASC) 2.5 MG tablet Take 2.5 mg by mouth daily.    Marland Kitchen. atorvastatin (LIPITOR) 40 MG tablet Take by mouth.    . chlorthalidone (HYGROTON) 25 MG tablet Take 25 mg by mouth daily.    . citalopram (  CELEXA) 20 MG tablet Take 1 tablet (20 mg total) by mouth daily. 90 tablet 1  . cyclobenzaprine (FLEXERIL) 10 MG tablet Take 10 mg by mouth 3 (three) times daily as needed for muscle spasms.    . Ferrous Sulfate 27 MG TABS Take 27 mg by mouth daily.     . fluticasone (FLONASE) 50 MCG/ACT nasal spray Place 1 spray into both nostrils daily.     .  Fluticasone-Salmeterol (ADVAIR) 250-50 MCG/DOSE AEPB Inhale 1 puff into the lungs 2 (two) times daily.    . furosemide (LASIX) 40 MG tablet Take 1 tablet (40 mg total) by mouth 2 (two) times daily. (Patient taking differently: Take 20 mg by mouth daily. ) 30 tablet 11  . meloxicam (MOBIC) 15 MG tablet Take 15 mg by mouth daily as needed for pain.    . Multiple Vitamin (MULTIVITAMIN) tablet Take 1 tablet by mouth daily.    . Potassium Gluconate 550 MG TABS Take 550 mg by mouth daily.    . QUEtiapine (SEROQUEL) 300 MG tablet Take 1 tablet (300 mg total) by mouth at bedtime. 90 tablet 3  . simvastatin (ZOCOR) 20 MG tablet Take 20 mg by mouth daily.     No current facility-administered medications for this visit.     Medication Side Effects: Sedation from pain meds.   Allergies:  Allergies  Allergen Reactions  . Penicillins Rash  . Tramadol Nausea Only    Past Medical History:  Diagnosis Date  . Anemia   . Arthritis   . Asthma   . Blood dyscrasia    polyclonal gamopathy  . Depression    Bipolar  . Fibromyalgia   . Headache   . Hyperlipidemia   . Hypertension   . Sarcoidosis   . Sleep apnea     Family History  Problem Relation Age of Onset  . Hypertension Brother   . Coronary artery disease Brother   . Asthma Brother     Social History   Socioeconomic History  . Marital status: Divorced    Spouse name: Not on file  . Number of children: Not on file  . Years of education: Not on file  . Highest education level: Not on file  Occupational History  . Not on file  Social Needs  . Financial resource strain: Not on file  . Food insecurity    Worry: Not on file    Inability: Not on file  . Transportation needs    Medical: Not on file    Non-medical: Not on file  Tobacco Use  . Smoking status: Never Smoker  . Smokeless tobacco: Never Used  Substance and Sexual Activity  . Alcohol use: No  . Drug use: No  . Sexual activity: Not on file  Lifestyle  . Physical  activity    Days per week: Not on file    Minutes per session: Not on file  . Stress: Not on file  Relationships  . Social Musician on phone: Not on file    Gets together: Not on file    Attends religious service: Not on file    Active member of club or organization: Not on file    Attends meetings of clubs or organizations: Not on file    Relationship status: Not on file  . Intimate partner violence    Fear of current or ex partner: Not on file    Emotionally abused: Not on file    Physically abused: Not on  file    Forced sexual activity: Not on file  Other Topics Concern  . Not on file  Social History Narrative  . Not on file    Past Medical History, Surgical history, Social history, and Family history were reviewed and updated as appropriate.   Please see review of systems for further details on the patient's review from today.   Objective:   Physical Exam:  There were no vitals taken for this visit.  Physical Exam Neurological:     Mental Status: She is alert and oriented to person, place, and time.     Cranial Nerves: No dysarthria.  Psychiatric:        Attention and Perception: Attention normal.        Mood and Affect: Mood is not anxious or depressed.        Speech: Speech normal.        Behavior: Behavior is cooperative.        Thought Content: Thought content normal. Thought content is not paranoid or delusional. Thought content does not include homicidal or suicidal ideation. Thought content does not include homicidal or suicidal plan.        Cognition and Memory: Cognition and memory normal.     Comments: Anxiety and depression are managed.  She has a history of hearing voices but they are not problematic at the present. Fair insight and better judgment     Lab Review:     Component Value Date/Time   NA 134 (L) 06/16/2017 0901   K 4.9 06/16/2017 0901   CL 93 (L) 06/15/2017 1437   CO2 29 06/15/2017 1437   GLUCOSE 162 (H) 06/16/2017 0901    BUN 30 (H) 06/15/2017 1437   CREATININE 1.43 (H) 06/15/2017 1437   CALCIUM 9.0 06/15/2017 1437   PROT 6.9 01/04/2017 1317   ALBUMIN 3.2 (L) 01/04/2017 1317   AST 23 01/04/2017 1317   ALT 14 01/04/2017 1317   ALKPHOS 95 01/04/2017 1317   BILITOT 0.4 01/04/2017 1317   GFRNONAA 38 (L) 06/15/2017 1437   GFRAA 44 (L) 06/15/2017 1437       Component Value Date/Time   WBC 19.0 (H) 06/15/2017 1437   RBC 4.47 06/15/2017 1437   HGB 13.3 06/16/2017 0901   HGB 11.7 09/03/2010 1143   HCT 39.0 06/16/2017 0901   HCT 35.6 09/03/2010 1143   PLT 173 06/15/2017 1437   PLT 189 09/03/2010 1143   MCV 84.3 06/15/2017 1437   MCV 82.4 09/03/2010 1143   MCH 27.1 06/15/2017 1437   MCHC 32.1 06/15/2017 1437   RDW 15.6 (H) 06/15/2017 1437   RDW 12.9 09/03/2010 1143   LYMPHSABS 2.8 07/18/2011 1137   LYMPHSABS 2.8 09/03/2010 1143   MONOABS 0.5 07/18/2011 1137   MONOABS 0.5 09/03/2010 1143   EOSABS 0.2 07/18/2011 1137   EOSABS 0.2 09/03/2010 1143   BASOSABS 0.0 07/18/2011 1137   BASOSABS 0.0 09/03/2010 1143    No results found for: POCLITH, LITHIUM   No results found for: PHENYTOIN, PHENOBARB, VALPROATE, CBMZ   .res Assessment: Plan:    Angelina was seen today for follow-up and other.  Diagnoses and all orders for this visit:  Schizoaffective disorder, depressive type ([place]) -     citalopram (CELEXA) 20 MG tablet; Take 1 tablet (20 mg total) by mouth daily. -     QUEtiapine (SEROQUEL) 300 MG tablet; Take 1 tablet (300 mg total) by mouth at bedtime.  Generalized anxiety disorder -     citalopram (CELEXA)  20 MG tablet; Take 1 tablet (20 mg total) by mouth daily. -     QUEtiapine (SEROQUEL) 300 MG tablet; Take 1 tablet (300 mg total) by mouth at bedtime.   Annalysa is doing worse with depression and anxiety.  Covid isolation is playing a role as is recent stressors with friends.  She's too isolated.   She continues on the Seroquel.  We discussed fall precautions and taking precautions against  excessive sleepiness when she gets up in the middle of the night particularly when she takes muscle relaxer medicines and pain meds.  Otherwise she seems to be tolerating the medications well and the hallucinations are under control.  She is not manic nor depressed.  We will make no med changes.  Discussed potential metabolic side effects associated with atypical antipsychotics, as well as potential risk for movement side effects. Advised pt to contact office if movement side effects occur.   Follow-up 3 months  Meredith Staggers, MD, DFAPA   Please see After Visit Summary for patient specific instructions.  No future appointments.  No orders of the defined types were placed in this encounter.     -------------------------------

## 2019-07-07 ENCOUNTER — Telehealth: Payer: Self-pay

## 2019-07-07 NOTE — Telephone Encounter (Signed)
Patient called in requesting COVID19 lab results  - DOB/Address verified - Negative results given, no further questions. 

## 2019-10-04 DIAGNOSIS — I10 Essential (primary) hypertension: Secondary | ICD-10-CM | POA: Insufficient documentation

## 2019-11-10 NOTE — Progress Notes (Signed)
Office Visit Note  Patient: Veronica Baker             Date of Birth: [birth year]           MRN: [id number]             PCP: Stevphen Rochester, MD Referring: Stevphen Rochester, MD Visit Date: 11/17/2019 Occupation: @GUAROCC @  Subjective:  Lower back pain and leg pain.   History of Present Illness: Veronica Baker is a 69 y.o. female seen in consultation per request of her PCP.  Patient has seen me in the past and her last visit was on August 15, 2012.  According to patient she was diagnosed with sarcoidosis approximately 7 years ago at that time she had a bronchoscopy.  She was initially treated with prednisone and had no follow-up.  Over time she started having joint pain and discomfort and was evaluated by me.  At the time her ultrasound was negative for synovitis.  She was experiencing lower back pain.  The x-rays were consistent with degenerative disc disease.  She was also diagnosed with moderate osteoarthritis in her knee joints at the time.  She also carries a diagnosis of fibromyalgia and has generalized pain from that.  Patient states in the last 2 weeks she has been having increased difficulty walking due to lower back pain which has been radiating to her lower extremities.  She continues to have bilateral knee joint pain.  None of the other joints are painful or swollen.  For the treatment of fibromyalgia she is on multiple medications.  Patient states she sees a pulmonologist August 17, 2012 in [place].  Activities of Daily Living:  Patient reports morning stiffness for 24 hours.   Patient Reports nocturnal pain.  Difficulty dressing/grooming: Reports Difficulty climbing stairs: Reports Difficulty getting out of chair: Reports Difficulty using hands for taps, buttons, cutlery, and/or writing: Reports  Review of Systems  Constitutional: Negative for fatigue, night sweats, weight gain and weight loss.  HENT: Positive for mouth sores. Negative for trouble swallowing,  trouble swallowing, mouth dryness and nose dryness.   Eyes: Negative for pain, redness, itching, visual disturbance and dryness.  Respiratory: Negative for cough, shortness of breath and difficulty breathing.   Cardiovascular: Negative for chest pain, palpitations, hypertension, irregular heartbeat and swelling in legs/feet.  Gastrointestinal: Negative for abdominal pain, blood in stool, constipation and diarrhea.  Endocrine: Negative for increased urination.  Genitourinary: Negative for difficulty urinating, painful urination and vaginal dryness.  Musculoskeletal: Positive for arthralgias, gait problem, joint pain, myalgias, morning stiffness, muscle tenderness and myalgias. Negative for joint swelling and muscle weakness.  Skin: Negative for color change, rash, hair loss, skin tightness, ulcers and sensitivity to sunlight.  Allergic/Immunologic: Negative for susceptible to infections.  Neurological: Negative for dizziness, numbness, headaches, memory loss, night sweats and weakness.  Hematological: Negative for swollen glands.  Psychiatric/Behavioral: Positive for sleep disturbance. Negative for depressed mood and confusion. The patient is not nervous/anxious.     PMFS History:  Patient Active Problem List   Diagnosis Date Noted  . Schizoaffective disorder (HCC) 08/28/2018  . Abnormal cardiovascular stress test 09/13/2012  . Diastolic dysfunction 09/13/2012  . SOB (shortness of breath) 09/06/2012    Past Medical History:  Diagnosis Date  . Anemia   . Arthritis   . Asthma   . Blood dyscrasia    polyclonal gamopathy  . Depression    Bipolar  . Fibromyalgia   . Headache   . Hyperlipidemia   .  Hypertension   . Sarcoidosis   . Sleep apnea     Family History  Problem Relation Age of Onset  . Hypertension Brother   . Coronary artery disease Brother   . Asthma Brother   . Alzheimer's disease Mother   . Healthy Son   . Healthy Son    Past Surgical History:  Procedure  Laterality Date  . ABDOMINAL HYSTERECTOMY    . APPENDECTOMY    . ARTERY BIOPSY Right 06/16/2017   Procedure: BIOPSY TEMPORAL ARTERY RIGHT;  Surgeon: Rosetta Posner, MD;  Location: [facility];  Service: Vascular;  Laterality: Right;  . bilateral oophorectomy    . BREAST SURGERY    . CARDIAC CATHETERIZATION    . COLON SURGERY     colonscopy  . HERNIA REPAIR    . TUBAL LIGATION    . UMBILICAL HERNIA REPAIR     Social History   Social History Narrative  . Not on file    There is no immunization history on file for this patient.   Objective: Vital Signs: BP 134/86 (BP Location: Right Arm, Patient Position: Sitting, Cuff Size: Normal)   Pulse 99   Resp 15   Ht 5\' 1"  (1.549 m)   Wt 221 lb (100.2 kg)   BMI 41.76 kg/m    Physical Exam Vitals and nursing note reviewed.  Constitutional:      Appearance: She is well-developed.  HENT:     Head: Normocephalic and atraumatic.  Eyes:     Conjunctiva/sclera: Conjunctivae normal.  Cardiovascular:     Rate and Rhythm: Normal rate and regular rhythm.     Heart sounds: Normal heart sounds.  Pulmonary:     Effort: Pulmonary effort is normal.     Breath sounds: Normal breath sounds.  Abdominal:     General: Bowel sounds are normal.     Palpations: Abdomen is soft.  Musculoskeletal:     Cervical back: Normal range of motion.  Lymphadenopathy:     Cervical: No cervical adenopathy.  Skin:    General: Skin is warm and dry.     Capillary Refill: Capillary refill takes less than 2 seconds.  Neurological:     Mental Status: She is alert and oriented to person, place, and time.  Psychiatric:        Behavior: Behavior normal.      Musculoskeletal Exam: Patient good range of motion of her cervical spine.  She has some discomfort range of motion of her shoulder joints.  Elbow joints wrist joint MCPs PIPs DIPs with good range of motion with no synovitis.  She has painful limited range of motion of her lumbar spine.  She had good range of motion  of hip joints.  She has good range of motion of bilateral knee joints without any warmth swelling or effusion.  Ankle joints MTPs PIPs with good range of motion with no synovitis.  CDAI Exam: CDAI Score: -- Patient Global: --; Provider Global: -- Swollen: --; Tender: -- Joint Exam 11/17/2019   No joint exam has been documented for this visit   There is currently no information documented on the homunculus. Go to the Rheumatology activity and complete the homunculus joint exam.  Investigation: No additional findings.  Imaging: XR KNEE 3 VIEW LEFT  Result Date: 11/17/2019 Moderate medial compartment narrowing with severe patellofemoral narrowing was noted.  No chondrocalcinosis was noted. Impression: These findings are consistent moderate osteoarthritis and severe chondromalacia patella.  XR KNEE 3 VIEW RIGHT  Result Date:  11/17/2019 Moderate medial compartment narrowing was noted.  Moderate to severe patellofemoral narrowing was noted.  No chondrocalcinosis was noted. Impression: These findings are consistent moderate osteoarthritis and moderate to severe chondromalacia patella.  XR Lumbar Spine 2-3 Views  Result Date: 11/17/2019 Multilevel spondylosis was noted.  Significant narrowing was noted between L3-4 L4-5 and L5-S1.  Spondylolisthesis was noted at L4-5 and L5-S1.  Facet joint arthropathy was noted.  No SI joint sclerosis was noted.  Bilateral hip joints show significant arthritis especially in her left hip joint. Impression: These findings are consistent with multilevel spondylosis and facet joint arthropathy.  Arthritis in the hip joints was also noted.   Recent Labs: Lab Results  Component Value Date   WBC 19.0 (H) 06/15/2017   HGB 13.3 06/16/2017   PLT 173 06/15/2017   NA 134 (L) 06/16/2017   K 4.9 06/16/2017   CL 93 (L) 06/15/2017   CO2 29 06/15/2017   GLUCOSE 162 (H) 06/16/2017   BUN 30 (H) 06/15/2017   CREATININE 1.43 (H) 06/15/2017   BILITOT 0.4 01/04/2017    ALKPHOS 95 01/04/2017   AST 23 01/04/2017   ALT 14 01/04/2017   PROT 6.9 01/04/2017   ALBUMIN 3.2 (L) 01/04/2017   CALCIUM 9.0 06/15/2017   GFRAA 44 (L) 06/15/2017    Speciality Comments: No specialty comments available.  Procedures:  No procedures performed Allergies: Penicillins and Tramadol   Assessment / Plan:     Visit Diagnoses: Chronic midline low back pain with bilateral sciatica -patient states she has been experiencing lower back pain for many years which is progressively getting worse.  She describes that the pain radiates into her lower extremities.  Plan: XR Lumbar Spine 2-3 Views.  X-ray showed multilevel spondylosis and facet joint arthropathy.  X-ray also revealed bilateral severe hip joint arthritis.  I will refer her to orthopedics.  Chronic pain of both knees -she has difficulty with mobility and discomfort in her knee joints.  No warmth swelling or effusion was noted.  Plan: XR KNEE 3 VIEW RIGHT, XR KNEE 3 VIEW LEFT.  X-ray showed moderate osteoarthritis and severe chondromalacia patella.  History of sarcoidosis-according to patient she had bronchoscopy several years ago which was positive for sarcoidosis.  She was initially treated with prednisone and had no recurrence.  She has been followed by a pulmonologist in [place] now.  Fibromyalgia-she has history of fibromyalgia for many years.  She has been on multiple medications which controlled her symptoms to some extent.  Other medical problems are listed as follows:  Schizoaffective disorder, depressive type (HCC)  Diastolic dysfunction  History of asthma  History of hyperlipidemia  Essential hypertension  Orders: Orders Placed This Encounter  Procedures  . XR Lumbar Spine 2-3 Views  . XR KNEE 3 VIEW RIGHT  . XR KNEE 3 VIEW LEFT   No orders of the defined types were placed in this encounter.   Face-to-face time spent with patient was 45 minutes. Greater than 50% of time was spent in counseling  and coordination of care.  Follow-Up Instructions: Return if symptoms worsen or fail to improve, for Osteoarthritis, DDD, sarcoidosis.   Pollyann Savoy, MD  Note - This record has been created using Animal nutritionist.  Chart creation errors have been sought, but may not always  have been located. Such creation errors do not reflect on  the standard of medical care.

## 2019-11-17 ENCOUNTER — Ambulatory Visit: Payer: Self-pay

## 2019-11-17 ENCOUNTER — Encounter: Payer: Self-pay | Admitting: Rheumatology

## 2019-11-17 ENCOUNTER — Ambulatory Visit: Payer: Medicare Other | Admitting: Rheumatology

## 2019-11-17 ENCOUNTER — Encounter (INDEPENDENT_AMBULATORY_CARE_PROVIDER_SITE_OTHER): Payer: Self-pay

## 2019-11-17 ENCOUNTER — Other Ambulatory Visit: Payer: Self-pay

## 2019-11-17 VITALS — BP 134/86 | HR 99 | Resp 15 | Ht 61.0 in | Wt 221.0 lb

## 2019-11-17 DIAGNOSIS — M5441 Lumbago with sciatica, right side: Secondary | ICD-10-CM

## 2019-11-17 DIAGNOSIS — M25561 Pain in right knee: Secondary | ICD-10-CM

## 2019-11-17 DIAGNOSIS — M25562 Pain in left knee: Secondary | ICD-10-CM | POA: Diagnosis not present

## 2019-11-17 DIAGNOSIS — Z8709 Personal history of other diseases of the respiratory system: Secondary | ICD-10-CM

## 2019-11-17 DIAGNOSIS — Z862 Personal history of diseases of the blood and blood-forming organs and certain disorders involving the immune mechanism: Secondary | ICD-10-CM | POA: Diagnosis not present

## 2019-11-17 DIAGNOSIS — G8929 Other chronic pain: Secondary | ICD-10-CM

## 2019-11-17 DIAGNOSIS — Z8639 Personal history of other endocrine, nutritional and metabolic disease: Secondary | ICD-10-CM

## 2019-11-17 DIAGNOSIS — M5442 Lumbago with sciatica, left side: Secondary | ICD-10-CM | POA: Diagnosis not present

## 2019-11-17 DIAGNOSIS — F251 Schizoaffective disorder, depressive type: Secondary | ICD-10-CM

## 2019-11-17 DIAGNOSIS — M797 Fibromyalgia: Secondary | ICD-10-CM | POA: Diagnosis not present

## 2019-11-17 DIAGNOSIS — I5189 Other ill-defined heart diseases: Secondary | ICD-10-CM

## 2019-11-17 DIAGNOSIS — I10 Essential (primary) hypertension: Secondary | ICD-10-CM

## 2019-11-17 NOTE — Addendum Note (Signed)
Addended by: Ellen Henri on: 11/17/2019 01:43 PM   Modules accepted: Orders

## 2019-11-22 ENCOUNTER — Other Ambulatory Visit: Payer: Self-pay

## 2019-11-22 ENCOUNTER — Ambulatory Visit (INDEPENDENT_AMBULATORY_CARE_PROVIDER_SITE_OTHER): Payer: Medicare HMO | Admitting: Family Medicine

## 2019-11-22 ENCOUNTER — Encounter: Payer: Self-pay | Admitting: Family Medicine

## 2019-11-22 ENCOUNTER — Telehealth: Payer: Self-pay | Admitting: Family Medicine

## 2019-11-22 DIAGNOSIS — R32 Unspecified urinary incontinence: Secondary | ICD-10-CM | POA: Diagnosis not present

## 2019-11-22 DIAGNOSIS — M5442 Lumbago with sciatica, left side: Secondary | ICD-10-CM

## 2019-11-22 DIAGNOSIS — M5441 Lumbago with sciatica, right side: Secondary | ICD-10-CM | POA: Diagnosis not present

## 2019-11-22 DIAGNOSIS — G8929 Other chronic pain: Secondary | ICD-10-CM | POA: Diagnosis not present

## 2019-11-22 MED ORDER — GABAPENTIN 100 MG PO CAPS: ORAL_CAPSULE | ORAL | 3 refills | Status: DC

## 2019-11-22 MED ORDER — BACLOFEN 10 MG PO TABS: 5.0000 mg | ORAL_TABLET | Freq: Three times a day (TID) | ORAL | 3 refills | Status: DC | PRN

## 2019-11-22 NOTE — Telephone Encounter (Signed)
Patient aware note at front desk  

## 2019-11-22 NOTE — Telephone Encounter (Signed)
Patient called advised she is suppose to be going to jury duty 11/28/2019. Patient asked if she can get a note stating she can not go because of how she is feeling and is under the doctors care. The number to contact patient is [phone]

## 2019-11-22 NOTE — Telephone Encounter (Signed)
Please advise 

## 2019-11-22 NOTE — Progress Notes (Addendum)
I saw and examined the patient with Dr. Selena Batten and agree with assessment and plan as outlined.     Chronic low back pain for the past 2 years, with subacute worsening over the past 2 weeks.  No definite injury.  She is noticing pain radiating down the back of her legs into the calves when walking, and she has had a couple episodes of urinary incontinence at night, with difficulty making it to the bathroom during the day and some leakage.  No fecal incontinence.  No fevers, chills, night sweats.  Exam today is notable for tenderness in both sciatic notches, negative straight leg raise, normal strength and slightly diminished left Achilles DTR but otherwise knees are symmetric and right ankle is 2+.  Recent x-rays showed severe bilateral hip DJD but she does not have significant pain with internal hip rotation although her range of motion is limited.  We will order a stat MRI scan to rule out cauda equina syndrome.  If negative, then physical therapy plus gabapentin and baclofen as needed.  We will stop NSAIDs due to decreased GFR.  Consider epidural injections if she fails to improve.

## 2019-11-22 NOTE — Telephone Encounter (Signed)
Ok to give note 

## 2019-11-22 NOTE — Progress Notes (Signed)
    SUBJECTIVE:   CHIEF COMPLAINT / HPI:   Two years ago - sharp pain in lower back - flexeril and mobic started which she continues to take. Flexeril helps, but mobic does not. Acutely, two weeks ago- started having pain in her bottom to the back of her legs down.  Does note urinary incontinence over the last two weeks. Getting up to walk in the morning is painful. Heating pad makes it worse. Hx of some falls, but no other traumas. No difficulties with sleep. No fevers, no dysuria. Denies saddle anaesthesia, bowel incontinence, weakness, numbness, tingling in her legs.  PERTINENT  PMH / PSH: CKD 3a  OBJECTIVE:   Hips:  Bilateral edema to mid calves. Patient walks with right handed cane. Patient with pain while adjusting her sitting position  TTP to bilateral gluteals around the area of piriformis. TTP of lumbar spinous processes L3-L5.  Full range of motion with both active and passive hip flexion/extension, internal/external rotation, adduction/abduction. Straight leg raise while sitting without pain and ROM > 180 degrees extension. 5 out of 5 strength in large muscle groups of lower extremities bilaterally. Pain elicited with resistance to knee flexion.  No sensory deficits bilaterally.   Antalgic gait, negative trendelenburg.   Knees:  Inspection: no gross abnormalities  Palpation: No TTP ROM: full  Strength: 5/5  N/V: minimal patella reflexes   Feet:  Inspection: edematous bilaterally  Palpation: No TTP. 1+ pitting edema bilaterally  ROM: full  Strength 5/5  N/V sensation intace. Right achiles 2+, left achilles 1+.    ASSESSMENT/PLAN:   No problem-specific Assessment & Plan notes found for this encounter.   1. Chronic low back pain with acute lumbosacral radiculopathy vs cauda equina syndrome Patient appears to have significant pain, especially standing up and sitting down. Will obtain stat MRI given urinary incontinence. Will order baclofen and gabapentin for pain.  Treatment pending MRI results.   2. Chronic mobic therapy  Last eGFR in 07/2019 was 48, CKD 3a.. D/C mobic. Pain medications as above   Wilber Oliphant, MD [facility]

## 2019-11-29 ENCOUNTER — Other Ambulatory Visit: Payer: Medicare HMO

## 2019-11-30 ENCOUNTER — Ambulatory Visit: Payer: Medicare Other | Admitting: Rheumatology

## 2019-12-27 ENCOUNTER — Ambulatory Visit
Admission: RE | Admit: 2019-12-27 | Discharge: 2019-12-27 | Disposition: A | Discharge status: Home / Self Care | Payer: Medicare HMO | Source: Ambulatory Visit | Attending: Family Medicine | Admitting: Family Medicine

## 2019-12-27 ENCOUNTER — Other Ambulatory Visit: Payer: Self-pay

## 2019-12-27 DIAGNOSIS — G8929 Other chronic pain: Secondary | ICD-10-CM

## 2019-12-27 DIAGNOSIS — R32 Unspecified urinary incontinence: Secondary | ICD-10-CM

## 2019-12-28 ENCOUNTER — Telehealth: Payer: Self-pay | Admitting: Family Medicine

## 2019-12-28 ENCOUNTER — Other Ambulatory Visit: Payer: Self-pay

## 2019-12-28 DIAGNOSIS — G8929 Other chronic pain: Secondary | ICD-10-CM

## 2019-12-28 DIAGNOSIS — M5442 Lumbago with sciatica, left side: Secondary | ICD-10-CM

## 2019-12-28 NOTE — Telephone Encounter (Signed)
I called the patient about her MRI results. She is choosing to try PT first. Referred to [facility], [place] location. The patient will let us know if she fails to improve and/or she would like to try ESI.

## 2019-12-28 NOTE — Telephone Encounter (Signed)
Lumbar MRI scan is notable for arthritic bone spurs along with disc bulging which results in moderate to severe narrowing of the spinal canal and nerve openings especially at the L3-4 and the L4-5 levels.  Physical therapy is certainly a consideration in order to try to reposition the discs and to hopefully improve the pain.  Epidural steroid injection is also a consideration for pain relief.  If she fails to improve, then surgical consultation.

## 2020-01-12 ENCOUNTER — Ambulatory Visit: Payer: Medicare HMO | Attending: Family Medicine

## 2020-01-12 ENCOUNTER — Other Ambulatory Visit: Payer: Self-pay

## 2020-01-12 DIAGNOSIS — G8929 Other chronic pain: Secondary | ICD-10-CM | POA: Diagnosis present

## 2020-01-12 DIAGNOSIS — M5442 Lumbago with sciatica, left side: Secondary | ICD-10-CM | POA: Diagnosis not present

## 2020-01-12 DIAGNOSIS — M6281 Muscle weakness (generalized): Secondary | ICD-10-CM | POA: Diagnosis present

## 2020-01-12 DIAGNOSIS — M5441 Lumbago with sciatica, right side: Secondary | ICD-10-CM | POA: Insufficient documentation

## 2020-01-12 DIAGNOSIS — R262 Difficulty in walking, not elsewhere classified: Secondary | ICD-10-CM | POA: Diagnosis present

## 2020-01-14 NOTE — Therapy (Signed)
[facility] [address] [place], [place], [address] Phone: [phone]   Fax:  [phone]  Physical Therapy Evaluation  Patient Details  Name: Veronica Baker MRN: [id number] Date of Birth: 1950/10/28 Referring Provider (PT): Lavada Mesi, MD   Encounter Date: 01/12/2020  PT End of Session - 01/14/20 0901    Visit Number  1    Number of Visits  13    Date for PT Re-Evaluation  03/03/20    Authorization Type  [employer] MEDICARE HMO/PPO    PT Start Time  1002    PT Stop Time  1047    PT Time Calculation (min)  45 min    Activity Tolerance  Patient tolerated treatment well    Behavior During Therapy  [facility] for tasks assessed/performed       Past Medical History:  Diagnosis Date  . Anemia   . Arthritis   . Asthma   . Blood dyscrasia    polyclonal gamopathy  . Depression    Bipolar  . Fibromyalgia   . Headache   . Hyperlipidemia   . Hypertension   . Sarcoidosis   . Sleep apnea     Past Surgical History:  Procedure Laterality Date  . ABDOMINAL HYSTERECTOMY    . APPENDECTOMY    . ARTERY BIOPSY Right 06/16/2017   Procedure: BIOPSY TEMPORAL ARTERY RIGHT;  Surgeon: Larina Earthly, MD;  Location: [facility] OR;  Service: Vascular;  Laterality: Right;  . bilateral oophorectomy    . BREAST SURGERY    . CARDIAC CATHETERIZATION    . COLON SURGERY     colonscopy  . HERNIA REPAIR    . TUBAL LIGATION    . UMBILICAL HERNIA REPAIR      There were no vitals filed for this visit.   Subjective Assessment - 01/14/20 0851    Subjective  Wrosening LBP c bilateral leg pain. 8/10. increased pain startd in Jan. No pain or a decrease in pain c sitting;    How long can you sit comfortably?  no limitation    How long can you stand comfortably?  5 mins    How long can you walk comfortably?  Walking hurts, unable to walk conmfortably    Diagnostic tests  Lumabr X-ray:  IMPRESSION:Multilevel degenerative changes as detailed above. Canal and  lateralrecess stenosis greatest at L3-L4 and L4-L5. Foraminal stenosis ispresent from L3-L4 to L5-S1. Facet arthropathy greatest at L4-L5. 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    Patient Stated Goals  For my back pain to get better    Currently in Pain?  Yes    Pain Score  8     Pain Location  Back    Pain Orientation  Right;Left;Posterior;Lower    Pain Descriptors / Indicators  Radiating;Sharp    Pain Type  Chronic pain    Pain Radiating Towards  poeterior legs intermittently    Pain Onset  1 to 4 weeks ago    Pain Frequency  Intermittent    Aggravating Factors   Walking    Pain Relieving Factors  Sitting    Effect of Pain on Daily Activities  significantly limts time on her feet         [facility] PT Assessment - 01/14/20 0001      Assessment   Medical Diagnosis  Chronic bilateral low back pain with bilateral sciatica    Referring Provider (PT)  Lavada Mesi, MD    Onset Date/Surgical Date  09/01/19    Hand Dominance  Right      Precautions   Precautions  None      Restrictions   Weight Bearing Restrictions  No      Balance Screen   Has the patient fallen in the past 6 months  Yes    How many times?  2    Has the patient had a decrease in activity level because of a fear of falling?   No    Is the patient reluctant to leave their home because of a fear of falling?   No      Home Environment   Living Environment  Private residence    Living Arrangements  Alone    Type of Home  Apartment    Home Access  Level entry    Home Layout  One level    Home Equipment  [place] - single point      Prior Function   Level of Independence  Independent    Vocation  Retired    Leisure  Take care and walks dog      Cognition   Overall Cognitive Status  Within Functional Limits for tasks assessed      Observation/Other Assessments   Focus on Therapeutic Outcomes (FOTO)   Not indicated      Coordination   Gross Motor Movements are Fluid and  Coordinated  --   Decreased quality with walking, turning and STS     Posture/Postural Control   Posture/Postural Control  Postural limitations    Postural Limitations  Increased lumbar lordosis      ROM / Strength   AROM / PROM / Strength  AROM;Strength      AROM   AROM Assessment Site  Lumbar    Lumbar Flexion  25% limited    Lumbar Extension  50% limited   Increase with LBP   Lumbar - Right Side Bend  25% limited    Lumbar - Left Side Bend  25% limited    Lumbar - Right Rotation  25% limited    Lumbar - Left Rotation  25% limited      Strength   Overall Strength Comments  LEs were grossly 4/5 s myotomal deficit observed      Flexibility   Soft Tissue Assessment /Muscle Length  --      Palpation   Palpation comment  Tender to palpation of the central low back L3-S1, SPs and paraspinals      Special Tests    Special Tests  Hip Special Tests    Hip Special Tests   Verizon Test    Findings  Positive    Side  Right    Comments  Lt      Transfers   Comments  Labored c deqreased quailty of movement      Ambulation/Gait   Gait Pattern  Step-to pattern;Antalgic   Decreased quality of movement                 Objective measurements completed on examination: See above findings.              PT Education - 01/14/20 0859    Education Details  Eval finding, POC, HEP for low back flexion flexibility, positioning and support sleeping to lessen pain, use of heat or cold packs for pain management.    Person(s) Educated  Patient    Methods  Explanation;Demonstration;Tactile cues;Verbal cues;Handout    Comprehension  Need further instruction;Tactile cues required;Verbal cues required;Returned demonstration;Verbalized understanding       PT Short Term Goals - 01/14/20 0931      PT SHORT TERM GOAL #1   Title  Pt will be ind in an initial HEP for low back/LE flexibility and strengthening to assist in pain reduction    Baseline  No program     Time  3    Period  Weeks    Status  New    Target Date  02/04/20      PT SHORT TERM GOAL #2   Title  Pt will voice understanding of pain management through positioning, support and heat/cold modalities    Baseline  Decreased understanding    Time  3    Period  Weeks    Status  New    Target Date  02/04/20      PT SHORT TERM GOAL #3   Title  Complete Berg, 5xSTS and TUG and set LTGs to measure improved functional mobility    Baseline  To be completed    Time  3    Period  Weeks    Status  New    Target Date  02/04/20        PT Long Term Goals - 01/14/20 0934      PT LONG TERM GOAL #1   Title  Increase pt's LE strength to 4+/5 for improved functional mobility    Baseline  4/5    Time  7    Period  Weeks    Status  New    Target Date  03/03/20      PT LONG TERM GOAL #2   Title  Pt will rate LBP in a range of 0-4/10 with daily activities    Baseline  0-8/10    Time  6    Period  Weeks    Status  New    Target Date  02/25/20      PT LONG TERM GOAL #3   Title  Pt will be ind in a final HEP for low back/LE flexibility and strengthening to assist in pain reduction and improve function    Baseline  No program    Time  6    Period  Weeks    Status  New    Target Date  02/25/20             Plan - 01/14/20 0911    Clinical Impression Statement  Pt presents with low back pain which impacts the quality of pt's mobility with pt walking a a slow pace and taking decreased steps. Pt uses a SPC for assistance and is looking into obtaining a walker. Pt responded positively to ther ex/HEP for lumbar flexion flexibility. Following ther ex, pt reported a decrease in her low back pain and the quality of pt's gait improved c an increased step length and pace. Pt will benefit from continued OPPT to address low back/LE flexbility, strength, posture, body mechanics and pain.    Personal Factors and Comorbidities  Age;Fitness;Comorbidity 3+    Comorbidities  Arhtritis,  fibromyalgia, HTN, sarcoidosis    Examination-Activity Limitations  Bed Mobility;Carry;Dressing;Lift;Stand;Stairs;Squat;Transfers    Stability/Clinical Decision Making  Evolving/Moderate complexity    Clinical Decision Making  Moderate    Rehab Potential  Good    PT Frequency  2x / week    PT Duration  6 weeks    PT Treatment/Interventions  ADLs/Self Care Home Management;Cryotherapy;Electrical Stimulation;DME Instruction;Ultrasound;Traction;Moist Heat;Iontophoresis 4mg /ml Dexamethasone;Gait training;Stair training;Functional  mobility training;Therapeutic activities;Therapeutic exercise;Balance training;Neuromuscular re-education;Manual techniques;Patient/family education;Passive range of motion;Dry needling;Taping;Spinal Manipulations    PT Next Visit Plan  Complete Berg, TUG and 5xSTS and set LTGs; assess response to HEP    PT Home Exercise Plan  [id number], see Instructions       Patient will benefit from skilled therapeutic intervention in order to improve the following deficits and impairments:  Difficulty walking, Decreased range of motion, Obesity, Decreased activity tolerance, Decreased knowledge of precautions, Pain, Decreased balance, Impaired flexibility, Improper body mechanics, Decreased mobility, Decreased strength, Postural dysfunction  Visit Diagnosis: Chronic bilateral low back pain with bilateral sciatica - Plan: PT plan of care cert/re-cert  Difficulty in walking, not elsewhere classified - Plan: PT plan of care cert/re-cert  Muscle weakness (generalized) - Plan: PT plan of care cert/re-cert     Problem List Patient Active Problem List   Diagnosis Date Noted  . Schizoaffective disorder (HCC) 08/28/2018  . Abnormal cardiovascular stress test 09/13/2012  . Diastolic dysfunction 09/13/2012  . SOB (shortness of breath) 09/06/2012   Joellyn Rued MS, PT 01/14/20 10:07 AM   [facility] [address] [place], [place],  [address] Phone: [phone]   Fax:  [phone]  Name: Veronica Baker MRN: [id number] Date of Birth: 10/23/1950

## 2020-01-15 DIAGNOSIS — H25093 Other age-related incipient cataract, bilateral: Secondary | ICD-10-CM | POA: Insufficient documentation

## 2020-01-15 DIAGNOSIS — Z860100 Personal history of colon polyps, unspecified: Secondary | ICD-10-CM | POA: Insufficient documentation

## 2020-01-15 DIAGNOSIS — R0683 Snoring: Secondary | ICD-10-CM | POA: Insufficient documentation

## 2020-01-15 DIAGNOSIS — H401190 Primary open-angle glaucoma, unspecified eye, stage unspecified: Secondary | ICD-10-CM | POA: Insufficient documentation

## 2020-01-18 ENCOUNTER — Other Ambulatory Visit: Payer: Self-pay

## 2020-01-18 ENCOUNTER — Ambulatory Visit: Payer: Medicare HMO

## 2020-01-18 DIAGNOSIS — M5442 Lumbago with sciatica, left side: Secondary | ICD-10-CM | POA: Diagnosis not present

## 2020-01-18 DIAGNOSIS — M6281 Muscle weakness (generalized): Secondary | ICD-10-CM

## 2020-01-18 DIAGNOSIS — G8929 Other chronic pain: Secondary | ICD-10-CM

## 2020-01-18 DIAGNOSIS — R262 Difficulty in walking, not elsewhere classified: Secondary | ICD-10-CM

## 2020-01-18 NOTE — Therapy (Signed)
[place], [place], [address] Phone: [phone]   Fax:  [phone]  Physical Therapy Treatment  Patient Details  Name: Veronica Baker MRN: [id number] Date of Birth: 08-Aug-1951 Referring Provider (PT): Eunice Blase, MD   Encounter Date: 01/18/2020  PT End of Session - 01/18/20 1306    Visit Number  2    Number of Visits  13    Date for PT Re-Evaluation  03/03/20    Authorization Type  [employer] MEDICARE HMO/PPO    PT Start Time  1158    PT Stop Time  1230    PT Time Calculation (min)  32 min    Equipment Utilized During Treatment  Gait belt    Activity Tolerance  Patient tolerated treatment well    Behavior During Therapy  [facility] for tasks assessed/performed       Past Medical History:  Diagnosis Date  . Anemia   . Arthritis   . Asthma   . Blood dyscrasia    polyclonal gamopathy  . Depression    Bipolar  . Fibromyalgia   . Headache   . Hyperlipidemia   . Hypertension   . Sarcoidosis   . Sleep apnea     Past Surgical History:  Procedure Laterality Date  . ABDOMINAL HYSTERECTOMY    . APPENDECTOMY    . ARTERY BIOPSY Right 06/16/2017   Procedure: BIOPSY TEMPORAL ARTERY RIGHT;  Surgeon: Rosetta Posner, MD;  Location: [place];  Service: Vascular;  Laterality: Right;  . bilateral oophorectomy    . BREAST SURGERY    . CARDIAC CATHETERIZATION    . COLON SURGERY     colonscopy  . HERNIA REPAIR    . TUBAL LIGATION    . UMBILICAL HERNIA REPAIR      There were no vitals filed for this visit.  Subjective Assessment - 01/18/20 1205    Subjective  Pt reports she fell when she was walking to her apartment after her last visit. She doesn't know why, as she did feel better after that session. she hasn't done the [facility] and LTRs because she thought she had to get on the floor.    Diagnostic tests  Lumabr X-ray:  IMPRESSION:Multilevel degenerative changes as detailed above. Canal and lateralrecess stenosis  greatest at L3-L4 and L4-L5. Foraminal stenosis ispresent from L3-L4 to L5-S1. Facet arthropathy greatest at L4-L5. 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    Patient Stated Goals  For my back pain to get better    Currently in Pain?  Yes    Pain Score  5     Pain Location  Knee    Pain Orientation  Left    Pain Descriptors / Indicators  Aching    Pain Type  Chronic pain         [facility] PT Assessment - 01/18/20 0001      Standardized Balance Assessment   Standardized Balance Assessment  Berg Balance Test      Berg Balance Test   Sit to Stand  Able to stand without using hands and stabilize independently    Standing Unsupported  Able to stand safely 2 minutes    Sitting with Back Unsupported but Feet Supported on Floor or Stool  Able to sit safely and securely 2 minutes    Stand to Sit  Sits safely with minimal use of hands    Transfers  Able to transfer safely, minor use of hands  Standing Unsupported with Eyes Closed  Able to stand 10 seconds with supervision    Standing Unsupported with Feet Together  Able to place feet together independently and stand for 1 minute with supervision    From Standing, Reach Forward with Outstretched Arm  Can reach forward >5 cm safely (2")    From Standing Position, Pick up Object from Floor  Able to pick up shoe, needs supervision    From Standing Position, Turn to Look Behind Over each Shoulder  Looks behind from both sides and weight shifts well    Turn 360 Degrees  Able to turn 360 degrees safely but slowly    Standing Unsupported, Alternately Place Feet on Step/Stool  Able to complete >2 steps/needs minimal assist   near LOB with fall prevented by PT   Standing Unsupported, One Foot in Front  Loses balance while stepping or standing    Standing on One Leg  Unable to try or needs assist to prevent fall    Total Score  38    Berg comment:  Increased fall risk                    [facility] Adult PT  Treatment/Exercise - 01/18/20 0001      Exercises   Exercises  Lumbar      Lumbar Exercises: Stretches   Single Knee to Chest Stretch  Right;Left;2 reps;30 seconds    Single Knee to Chest Stretch Limitations  opposite LE extended with overpressure for stretching HFs    Lower Trunk Rotation Limitations  2 x 20 with PT provided OP             PT Education - 01/18/20 1303    Education Details  Perform SKTC and LTR's on bed, in order to complete exercises safely    Person(s) Educated  Patient    Methods  Explanation;Demonstration    Comprehension  Verbalized understanding       PT Short Term Goals - 01/14/20 0931      PT SHORT TERM GOAL #1   Title  Pt will be ind in an initial HEP for low back/LE flexibility and strengthening to assist in pain reduction    Baseline  No program    Time  3    Period  Weeks    Status  New    Target Date  02/04/20      PT SHORT TERM GOAL #2   Title  Pt will voice understanding of pain management through positioning, support and heat/cold modalities    Baseline  Decreased understanding    Time  3    Period  Weeks    Status  New    Target Date  02/04/20      PT SHORT TERM GOAL #3   Title  Complete Berg, 5xSTS and TUG and set LTGs to measure improved functional mobility    Baseline  To be completed    Time  3    Period  Weeks    Status  New    Target Date  02/04/20        PT Long Term Goals - 01/14/20 0934      PT LONG TERM GOAL #1   Title  Increase pt's LE strength to 4+/5 for improved functional mobility    Baseline  4/5    Time  7    Period  Weeks    Status  New    Target Date  03/03/20  PT LONG TERM GOAL #2   Title  Pt will rate LBP in a range of 0-4/10 with daily activities    Baseline  0-8/10    Time  6    Period  Weeks    Status  New    Target Date  02/25/20      PT LONG TERM GOAL #3   Title  Pt will be ind in a final HEP for low back/LE flexibility and strengthening to assist in pain reduction and improve  function    Baseline  No program    Time  6    Period  Weeks    Status  New    Target Date  02/25/20            Plan - 01/18/20 1307    Clinical Impression Statement  Pt presents nearly 15 minutes late; therefore, session time shortened. Pt demonstrates unsafe gait when attempting to ambulate more quickly with impaired R ankle DF and foot clearance. Pt educated to ambulate more slowly, pacing herself, in order to decrease fall risk as pt reported falling after her initial evaluation outside of her home. She scored 38/56 today on the BERG demonstrating increased fall risk. She responded well to supine stretching and will continue to benefit from skilled progressions of flexibility, mobility, and strength to decrease falls risk.    Personal Factors and Comorbidities  Age;Fitness;Comorbidity 3+    Comorbidities  Arhtritis, fibromyalgia, HTN, sarcoidosis    Examination-Activity Limitations  Bed Mobility;Carry;Dressing;Lift;Stand;Stairs;Squat;Transfers    Stability/Clinical Decision Making  Evolving/Moderate complexity    Rehab Potential  Good    PT Frequency  2x / week    PT Duration  6 weeks    PT Treatment/Interventions  ADLs/Self Care Home Management;Cryotherapy;Electrical Stimulation;DME Instruction;Ultrasound;Traction;Moist Heat;Iontophoresis 4mg /ml Dexamethasone;Gait training;Stair training;Functional mobility training;Therapeutic activities;Therapeutic exercise;Balance training;Neuromuscular re-education;Manual techniques;Patient/family education;Passive range of motion;Dry needling;Taping;Spinal Manipulations    PT Next Visit Plan  Complete TUG, progress stretching, add strengthening    PT Home Exercise Plan  [id number], see Instructions       Patient will benefit from skilled therapeutic intervention in order to improve the following deficits and impairments:  Difficulty walking, Decreased range of motion, Obesity, Decreased activity tolerance, Decreased knowledge of precautions,  Pain, Decreased balance, Impaired flexibility, Improper body mechanics, Decreased mobility, Decreased strength, Postural dysfunction  Visit Diagnosis: Chronic bilateral low back pain with bilateral sciatica  Difficulty in walking, not elsewhere classified  Muscle weakness (generalized)     Problem List Patient Active Problem List   Diagnosis Date Noted  . Schizoaffective disorder (HCC) 08/28/2018  . Abnormal cardiovascular stress test 09/13/2012  . Diastolic dysfunction 09/13/2012  . SOB (shortness of breath) 09/06/2012    11/04/2012, PT, DPT 01/18/2020, 1:19 PM  [facility] [address] [place], [place], [place] Phone: [phone]   Fax:  [phone]  Name: Veronica Baker MRN: Natalia Leatherwood Date of Birth: [birth year]

## 2020-01-26 ENCOUNTER — Other Ambulatory Visit: Payer: Self-pay

## 2020-01-26 ENCOUNTER — Ambulatory Visit: Payer: Medicare HMO

## 2020-01-26 DIAGNOSIS — M6281 Muscle weakness (generalized): Secondary | ICD-10-CM

## 2020-01-26 DIAGNOSIS — G8929 Other chronic pain: Secondary | ICD-10-CM

## 2020-01-26 DIAGNOSIS — M5442 Lumbago with sciatica, left side: Secondary | ICD-10-CM

## 2020-01-26 DIAGNOSIS — R262 Difficulty in walking, not elsewhere classified: Secondary | ICD-10-CM

## 2020-01-26 NOTE — Therapy (Signed)
[facility] [address] [place], [place], [address] Phone: [phone]   Fax:  [phone]  Physical Therapy Treatment  Patient Details  Name: Veronica Baker MRN: [id number] Date of Birth: 1951-02-27 Referring Provider (PT): Lavada Mesi, MD   Encounter Date: 01/26/2020  PT End of Session - 01/26/20 2028    Visit Number  3    Number of Visits  13    Date for PT Re-Evaluation  03/03/20    Authorization Type  AETNA MEDICARE HMO/PPO    PT Start Time  0835    PT Stop Time  0915    PT Time Calculation (min)  40 min    Activity Tolerance  Patient tolerated treatment well    Behavior During Therapy  [facility] for tasks assessed/performed       Past Medical History:  Diagnosis Date  . Anemia   . Arthritis   . Asthma   . Blood dyscrasia    polyclonal gamopathy  . Depression    Bipolar  . Fibromyalgia   . Headache   . Hyperlipidemia   . Hypertension   . Sarcoidosis   . Sleep apnea     Past Surgical History:  Procedure Laterality Date  . ABDOMINAL HYSTERECTOMY    . APPENDECTOMY    . ARTERY BIOPSY Right 06/16/2017   Procedure: BIOPSY TEMPORAL ARTERY RIGHT;  Surgeon: Larina Earthly, MD;  Location: [facility] OR;  Service: Vascular;  Laterality: Right;  . bilateral oophorectomy    . BREAST SURGERY    . CARDIAC CATHETERIZATION    . COLON SURGERY     colonscopy  . HERNIA REPAIR    . TUBAL LIGATION    . UMBILICAL HERNIA REPAIR      There were no vitals filed for this visit.  Subjective Assessment - 01/26/20 0842    Subjective  Pt her LBP is some better, reporting 5/10 pain today. pt reports she has been completing her HEP about 1x per day.    Currently in Pain?  Yes    Pain Score  5     Pain Location  Back    Pain Orientation  Posterior   midline to L   Pain Descriptors / Indicators  Aching    Pain Type  Chronic pain    Pain Onset  1 to 4 weeks ago    Pain Frequency  Intermittent    Aggravating Factors   walking    Pain  Relieving Factors  sitting    Effect of Pain on Daily Activities  significantly limits time on feet    Multiple Pain Sites  No                        [facility] Adult PT Treatment/Exercise - 01/26/20 0001      Transfers   Comments  Labored c deqreased quailty of movement      Ambulation/Gait   Gait Pattern  Step-to pattern;Antalgic   Decreased quality of movement     Exercises   Exercises  Lumbar      Lumbar Exercises: Stretches   Single Knee to Chest Stretch  Right;Left;2 reps;20 seconds    Single Knee to Chest Stretch Limitations  opposite LE extended with overpressure for stretching HFs    Lower Trunk Rotation  3 reps;10 seconds    Piriformis Stretch  2 reps;20 seconds      Lumbar Exercises: Supine   Pelvic Tilt  15 reps  Pelvic Tilt Limitations  3 sec    Bent Knee Raise  10 reps    Bent Knee Raise Limitations  2 sets;              PT Education - 01/26/20 2026    Education Details  Piriformis stretch ex was sdded to pt's HEP. To complete her HEP 2x daily.    Person(s) Educated  Patient    Methods  Explanation;Demonstration;Tactile cues;Verbal cues;Handout    Comprehension  Verbalized understanding;Returned demonstration;Verbal cues required;Tactile cues required       PT Short Term Goals - 01/14/20 0931      PT SHORT TERM GOAL #1   Title  Pt will be ind in an initial HEP for low back/LE flexibility and strengthening to assist in pain reduction    Baseline  No program    Time  3    Period  Weeks    Status  New    Target Date  02/04/20      PT SHORT TERM GOAL #2   Title  Pt will voice understanding of pain management through positioning, support and heat/cold modalities    Baseline  Decreased understanding    Time  3    Period  Weeks    Status  New    Target Date  02/04/20      PT SHORT TERM GOAL #3   Title  Complete Berg, 5xSTS and TUG and set LTGs to measure improved functional mobility    Baseline  To be completed    Time  3     Period  Weeks    Status  New    Target Date  02/04/20        PT Long Term Goals - 01/14/20 0934      PT LONG TERM GOAL #1   Title  Increase pt's LE strength to 4+/5 for improved functional mobility    Baseline  4/5    Time  7    Period  Weeks    Status  New    Target Date  03/03/20      PT LONG TERM GOAL #2   Title  Pt will rate LBP in a range of 0-4/10 with daily activities    Baseline  0-8/10    Time  6    Period  Weeks    Status  New    Target Date  02/25/20      PT LONG TERM GOAL #3   Title  Pt will be ind in a final HEP for low back/LE flexibility and strengthening to assist in pain reduction and improve function    Baseline  No program    Time  6    Period  Weeks    Status  New    Target Date  02/25/20            Plan - 01/26/20 0901    Clinical Impression Statement  Following PT session, pt reported a reduction of back pain to 3/10. HEP was updated and pt was encouraged to complete 2 to 3x a day since she received good relief during PT session. Pt was encouraged to walk at a slower pace with her continuing to demonstrate decreased walking balance, ie decreased L foot clearance. Pt reports no new falls.    Personal Factors and Comorbidities  Age;Fitness;Comorbidity 3+    Comorbidities  Arhtritis, fibromyalgia, HTN, sarcoidosis    Examination-Activity Limitations  Bed Mobility;Carry;Dressing;Lift;Stand;Stairs;Squat;Transfers    Stability/Clinical Decision Making  Evolving/Moderate  complexity    Rehab Potential  Good    PT Frequency  2x / week    PT Duration  6 weeks    PT Treatment/Interventions  ADLs/Self Care Home Management;Cryotherapy;Electrical Stimulation;DME Instruction;Ultrasound;Traction;Moist Heat;Iontophoresis 4mg /ml Dexamethasone;Gait training;Stair training;Functional mobility training;Therapeutic activities;Therapeutic exercise;Balance training;Neuromuscular re-education;Manual techniques;Patient/family education;Passive range of motion;Dry  needling;Taping;Spinal Manipulations    PT Next Visit Plan  Assess HEP and pt's consistency of completion. Continue to assess balance and address as needed.    PT Home Exercise Plan  [id number]. Piriformis stretch added       Patient will benefit from skilled therapeutic intervention in order to improve the following deficits and impairments:  Difficulty walking, Decreased range of motion, Obesity, Decreased activity tolerance, Decreased knowledge of precautions, Pain, Decreased balance, Impaired flexibility, Improper body mechanics, Decreased mobility, Decreased strength, Postural dysfunction  Visit Diagnosis: Chronic bilateral low back pain with bilateral sciatica  Difficulty in walking, not elsewhere classified  Muscle weakness (generalized)     Problem List Patient Active Problem List   Diagnosis Date Noted  . Schizoaffective disorder ([place]) 08/28/2018  . Abnormal cardiovascular stress test 09/13/2012  . Diastolic dysfunction 72/53/6644  . SOB (shortness of breath) 09/06/2012    Gar Ponto MS, PT 01/26/20 8:43 PM  [facility] [address] [place], [place], [address] Phone: [phone]   Fax:  [phone]  Name: IZEL HOCHBERG MRN: [id number] Date of Birth: 02/19/1951

## 2020-01-30 ENCOUNTER — Other Ambulatory Visit: Payer: Self-pay

## 2020-01-30 ENCOUNTER — Ambulatory Visit: Payer: Medicare HMO | Admitting: Physical Therapy

## 2020-01-30 DIAGNOSIS — F411 Generalized anxiety disorder: Secondary | ICD-10-CM

## 2020-01-30 DIAGNOSIS — F251 Schizoaffective disorder, depressive type: Secondary | ICD-10-CM

## 2020-01-30 MED ORDER — CITALOPRAM HYDROBROMIDE 20 MG PO TABS: 20.0000 mg | ORAL_TABLET | Freq: Every day | ORAL | 0 refills | Status: DC

## 2020-02-06 ENCOUNTER — Ambulatory Visit (INDEPENDENT_AMBULATORY_CARE_PROVIDER_SITE_OTHER): Payer: Medicare HMO | Admitting: Psychiatry

## 2020-02-06 ENCOUNTER — Other Ambulatory Visit: Payer: Self-pay

## 2020-02-06 ENCOUNTER — Encounter: Payer: Self-pay | Admitting: Psychiatry

## 2020-02-06 DIAGNOSIS — F251 Schizoaffective disorder, depressive type: Secondary | ICD-10-CM

## 2020-02-06 DIAGNOSIS — F411 Generalized anxiety disorder: Secondary | ICD-10-CM | POA: Diagnosis not present

## 2020-02-06 MED ORDER — CITALOPRAM HYDROBROMIDE 20 MG PO TABS: 20.0000 mg | ORAL_TABLET | Freq: Every day | ORAL | 1 refills | Status: DC

## 2020-02-06 MED ORDER — QUETIAPINE FUMARATE 300 MG PO TABS: 300.0000 mg | ORAL_TABLET | Freq: Every day | ORAL | 1 refills | Status: DC
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
## 2020-02-07 ENCOUNTER — Ambulatory Visit: Payer: Medicare HMO | Attending: Family Medicine

## 2020-02-07 DIAGNOSIS — M6281 Muscle weakness (generalized): Secondary | ICD-10-CM | POA: Diagnosis present

## 2020-02-07 DIAGNOSIS — M5442 Lumbago with sciatica, left side: Secondary | ICD-10-CM | POA: Insufficient documentation

## 2020-02-07 DIAGNOSIS — R262 Difficulty in walking, not elsewhere classified: Secondary | ICD-10-CM | POA: Diagnosis present

## 2020-02-07 DIAGNOSIS — G8929 Other chronic pain: Secondary | ICD-10-CM | POA: Insufficient documentation

## 2020-02-07 DIAGNOSIS — M5441 Lumbago with sciatica, right side: Secondary | ICD-10-CM | POA: Insufficient documentation

## 2020-02-07 NOTE — Therapy (Signed)
[place], [place], [address] Phone: [phone]   Fax:  [phone]  Physical Therapy Treatment  Patient Details  Name: Veronica Baker MRN: [id number] Date of Birth: 08-Jun-1951 Referring Provider (PT): Eunice Blase, MD   Encounter Date: 02/07/2020  PT End of Session - 02/07/20 1552    Visit Number  4    Number of Visits  13    Date for PT Re-Evaluation  03/03/20    Authorization Type  AETNA MEDICARE HMO/PPO    PT Start Time  0932    PT Stop Time  1220    PT Time Calculation (min)  44 min    Activity Tolerance  Patient tolerated treatment well    Behavior During Therapy  [facility] for tasks assessed/performed       Past Medical History:  Diagnosis Date  . Anemia   . Arthritis   . Asthma   . Blood dyscrasia    polyclonal gamopathy  . Depression    Bipolar  . Fibromyalgia   . Headache   . Hyperlipidemia   . Hypertension   . Sarcoidosis   . Sleep apnea     Past Surgical History:  Procedure Laterality Date  . ABDOMINAL HYSTERECTOMY    . APPENDECTOMY    . ARTERY BIOPSY Right 06/16/2017   Procedure: BIOPSY TEMPORAL ARTERY RIGHT;  Surgeon: Rosetta Posner, MD;  Location: [place];  Service: Vascular;  Laterality: Right;  . bilateral oophorectomy    . BREAST SURGERY    . CARDIAC CATHETERIZATION    . COLON SURGERY     colonscopy  . HERNIA REPAIR    . TUBAL LIGATION    . UMBILICAL HERNIA REPAIR      There were no vitals filed for this visit.  Subjective Assessment - 02/07/20 1156    Subjective  Pt reports her is having less LBP. She rates as a 0/10. Pt reports she has been completing her HEP daily.    Pain Score  0-No pain    Pain Location  Back    Pain Orientation  Posterior    Pain Onset  1 to 4 weeks ago    Pain Frequency  Intermittent    Aggravating Factors   Walking    Pain Relieving Factors  Sitting    Effect of Pain on Daily Activities  Significantly limits time on her feet                         [facility] Adult PT Treatment/Exercise - 02/07/20 0001      Transfers   Comments  Labored, but to less of a degree      Ambulation/Gait   Gait Pattern  Step-to pattern   improved quality, movement is better controlled     Exercises   Exercises  Lumbar      Lumbar Exercises: Stretches   Single Knee to Chest Stretch  Right;Left;2 reps;20 seconds    Lower Trunk Rotation  3 reps;10 seconds    Piriformis Stretch  2 reps;20 seconds    Other Lumbar Stretch Exercise  Child's pose c large ball; forward and laterally each direction 5x; 5 sec      Lumbar Exercises: Supine   Pelvic Tilt  15 reps    Pelvic Tilt Limitations  3 sec    Bent Knee Raise  10 reps    Bent Knee Raise Limitations  2 sets;  PT Education - 02/07/20 1552    Education Details  LE strenghtening exs were added to the pt's HEP    Person(s) Educated  Patient    Methods  Explanation;Demonstration;Tactile cues;Verbal cues;Handout    Comprehension  Verbalized understanding;Returned demonstration;Verbal cues required;Tactile cues required;Need further instruction       PT Short Term Goals - 01/14/20 0931      PT SHORT TERM GOAL #1   Title  Pt will be ind in an initial HEP for low back/LE flexibility and strengthening to assist in pain reduction    Baseline  No program    Time  3    Period  Weeks    Status  New    Target Date  02/04/20      PT SHORT TERM GOAL #2   Title  Pt will voice understanding of pain management through positioning, support and heat/cold modalities    Baseline  Decreased understanding    Time  3    Period  Weeks    Status  New    Target Date  02/04/20      PT SHORT TERM GOAL #3   Title  Complete Berg, 5xSTS and TUG and set LTGs to measure improved functional mobility    Baseline  To be completed    Time  3    Period  Weeks    Status  New    Target Date  02/04/20        PT Long Term Goals - 01/14/20 0934      PT LONG TERM GOAL #1    Title  Increase pt's LE strength to 4+/5 for improved functional mobility    Baseline  4/5    Time  7    Period  Weeks    Status  New    Target Date  03/03/20      PT LONG TERM GOAL #2   Title  Pt will rate LBP in a range of 0-4/10 with daily activities    Baseline  0-8/10    Time  6    Period  Weeks    Status  New    Target Date  02/25/20      PT LONG TERM GOAL #3   Title  Pt will be ind in a final HEP for low back/LE flexibility and strengthening to assist in pain reduction and improve function    Baseline  No program    Time  6    Period  Weeks    Status  New    Target Date  02/25/20            Plan - 02/07/20 1555    Clinical Impression Statement  Continue to address pt's LBP through LE/core flexibility and strengthening. LE standing exs were added to address strength, balance and gait quality issues. Pt reports improved consistency with HEP completion which seems to be helping her low back pain. Pt's movement quality was improved today with sit t/f standing, sit t/f supine and walking. Pt's movement quality issues aslo appear to have a neurological component as well as back pain.    Personal Factors and Comorbidities  Age;Fitness;Comorbidity 3+    Comorbidities  Arhtritis, fibromyalgia, HTN, sarcoidosis    Examination-Activity Limitations  Bed Mobility;Carry;Dressing;Lift;Stand;Stairs;Squat;Transfers    Stability/Clinical Decision Making  Evolving/Moderate complexity    Rehab Potential  Good    PT Frequency  2x / week    PT Duration  6 weeks    PT Treatment/Interventions  ADLs/Self  Care Home Management;Cryotherapy;Electrical Stimulation;DME Instruction;Ultrasound;Traction;Moist Heat;Iontophoresis 4mg /ml Dexamethasone;Gait training;Stair training;Functional mobility training;Therapeutic activities;Therapeutic exercise;Balance training;Neuromuscular re-education;Manual techniques;Patient/family education;Passive range of motion;Dry needling;Taping;Spinal Manipulations     PT Next Visit Plan  Continue to address back pain, strength and functional mobility    PT Home Exercise Plan  [id number]. Standing LE strengthening exs were added       Patient will benefit from skilled therapeutic intervention in order to improve the following deficits and impairments:  Difficulty walking, Decreased range of motion, Obesity, Decreased activity tolerance, Decreased knowledge of precautions, Pain, Decreased balance, Impaired flexibility, Improper body mechanics, Decreased mobility, Decreased strength, Postural dysfunction  Visit Diagnosis: Chronic bilateral low back pain with bilateral sciatica  Difficulty in walking, not elsewhere classified  Muscle weakness (generalized)     Problem List Patient Active Problem List   Diagnosis Date Noted  . Schizoaffective disorder ([facility]) 08/28/2018  . Abnormal cardiovascular stress test 09/13/2012  . Diastolic dysfunction 09/13/2012  . SOB (shortness of breath) 09/06/2012    11/04/2012 MS, PT 02/07/20 4:16 PM  [facility] [address] [place], [place], [place] Phone: [phone]   Fax:  [phone]  Name: SHANTELLE ALLES MRN: Natalia Leatherwood Date of Birth: 11/16/1950

## 2020-02-09 ENCOUNTER — Other Ambulatory Visit: Payer: Self-pay

## 2020-02-09 ENCOUNTER — Ambulatory Visit: Payer: Medicare HMO

## 2020-02-09 DIAGNOSIS — M6281 Muscle weakness (generalized): Secondary | ICD-10-CM

## 2020-02-09 DIAGNOSIS — M5442 Lumbago with sciatica, left side: Secondary | ICD-10-CM | POA: Diagnosis not present

## 2020-02-09 DIAGNOSIS — R262 Difficulty in walking, not elsewhere classified: Secondary | ICD-10-CM

## 2020-02-09 DIAGNOSIS — G8929 Other chronic pain: Secondary | ICD-10-CM

## 2020-02-09 NOTE — Therapy (Signed)
[place], [place], [address] Phone: [phone]   Fax:  [phone]  Physical Therapy Treatment  Patient Details  Name: Veronica Baker MRN: [id number] Date of Birth: [birth year] Referring Provider (PT): Eunice Blase, MD   Encounter Date: 02/09/2020   PT End of Session - 02/09/20 1150    Visit Number 5    Number of Visits 13    Date for PT Re-Evaluation 03/03/20    Authorization Type AET[employer] MEDICARE HMO/PPO    PT Start Time 1137    PT Stop Time 0240    PT Time Calculation (min) 58 min    Activity Tolerance Patient tolerated treatment well    Behavior During Therapy [facility] for tasks assessed/performed           Past Medical History:  Diagnosis Date  . Anemia   . Arthritis   . Asthma   . Blood dyscrasia    polyclonal gamopathy  . Depression    Bipolar  . Fibromyalgia   . Headache   . Hyperlipidemia   . Hypertension   . Sarcoidosis   . Sleep apnea     Past Surgical History:  Procedure Laterality Date  . ABDOMINAL HYSTERECTOMY    . APPENDECTOMY    . ARTERY BIOPSY Right 06/16/2017   Procedure: BIOPSY TEMPORAL ARTERY RIGHT;  Surgeon: Rosetta Posner, MD;  Location: [place];  Service: Vascular;  Laterality: Right;  . bilateral oophorectomy    . BREAST SURGERY    . CARDIAC CATHETERIZATION    . COLON SURGERY     colonscopy  . HERNIA REPAIR    . TUBAL LIGATION    . UMBILICAL HERNIA REPAIR      There were no vitals filed for this visit.   Subjective Assessment - 02/09/20 1143    Subjective pt reports she is continuing to do better with her LBP. Today it is primarily on the L side and she rates a 3/10.    Currently in Pain? Yes    Pain Score 3     Pain Location Back    Pain Orientation Left;Posterior    Pain Descriptors / Indicators Aching    Pain Type Chronic pain                             [facility] Adult PT Treatment/Exercise - 02/09/20 0001      Transfers    Comments Labored, but to less of a degree      Ambulation/Gait   Gait Pattern Step-to pattern   improved quality, movement is better controlled     Exercises   Exercises Lumbar      Lumbar Exercises: Stretches   Single Knee to Chest Stretch Right;Left;2 reps;20 seconds    Lower Trunk Rotation 3 reps;10 seconds    Piriformis Stretch 2 reps;20 seconds    Other Lumbar Stretch Exercise Child's pose c large ball; forward and laterally each direction 5x; 5 sec      Lumbar Exercises: Supine   Pelvic Tilt 15 reps    Pelvic Tilt Limitations 3 sec    Bent Knee Raise 10 reps    Bent Knee Raise Limitations 2 sets;       Modalities   Modalities Moist Heat      Moist Heat Therapy   Number Minutes Moist Heat 15 Minutes    Moist Heat Location Lumbar Spine  PT Education - 02/09/20 1244    Education Details Additional LE strengthening exs in standing were added    Person(s) Educated Patient    Methods Explanation;Demonstration;Tactile cues;Verbal cues;Handout    Comprehension Returned demonstration;Verbalized understanding;Verbal cues required;Tactile cues required            PT Short Term Goals - 01/14/20 0931      PT SHORT TERM GOAL #1   Title Pt will be ind in an initial HEP for low back/LE flexibility and strengthening to assist in pain reduction    Baseline No program    Time 3    Period Weeks    Status New    Target Date 02/04/20      PT SHORT TERM GOAL #2   Title Pt will voice understanding of pain management through positioning, support and heat/cold modalities    Baseline Decreased understanding    Time 3    Period Weeks    Status New    Target Date 02/04/20      PT SHORT TERM GOAL #3   Title Complete Berg, 5xSTS and TUG and set LTGs to measure improved functional mobility    Baseline To be completed    Time 3    Period Weeks    Status New    Target Date 02/04/20             PT Long Term Goals - 02/09/20 1259      PT LONG TERM  GOAL #4   Title Improve Berg score to 45/56    Baseline 38    Status New    Target Date 02/25/20                 Plan - 02/09/20 1247    Clinical Impression Statement Additional LE strengthening exs in standing were added to help with balance. Pt arrived to PT using a 4wrw instead of a SPC. The 4wrw provided pt with better balance assistance With proper technique, Pt complete STS with greater ease.    Personal Factors and Comorbidities Age;Fitness;Comorbidity 3+    Comorbidities Arhtritis, fibromyalgia, HTN, sarcoidosis    Examination-Activity Limitations Bed Mobility;Carry;Dressing;Lift;Stand;Stairs;Squat;Transfers    Clinical Decision Making Moderate    Rehab Potential Good    PT Frequency 2x / week    PT Duration 6 weeks    PT Treatment/Interventions ADLs/Self Care Home Management;Cryotherapy;Electrical Stimulation;DME Instruction;Ultrasound;Traction;Moist Heat;Iontophoresis 4mg /ml Dexamethasone;Gait training;Stair training;Functional mobility training;Therapeutic activities;Therapeutic exercise;Balance training;Neuromuscular re-education;Manual techniques;Patient/family education;Passive range of motion;Dry needling;Taping;Spinal Manipulations    PT Next Visit Plan Continue to address back pain, strength and functional mobility.    PT Home Exercise Plan [id number]. additional standing LE strengthening exs for balance were added           Patient will benefit from skilled therapeutic intervention in order to improve the following deficits and impairments:  Difficulty walking, Decreased range of motion, Obesity, Decreased activity tolerance, Decreased knowledge of precautions, Pain, Decreased balance, Impaired flexibility, Improper body mechanics, Decreased mobility, Decreased strength, Postural dysfunction  Visit Diagnosis: Chronic bilateral low back pain with bilateral sciatica  Difficulty in walking, not elsewhere classified  Muscle weakness (generalized)     Problem  List Patient Active Problem List   Diagnosis Date Noted  . Schizoaffective disorder (HCC) 08/28/2018  . Abnormal cardiovascular stress test 09/13/2012  . Diastolic dysfunction 09/13/2012  . SOB (shortness of breath) 09/06/2012    11/04/2012 MS, PT 02/09/20 1:04 PM  [facility] [address] [place],  [place], [address] Phone: [phone]   Fax:  [phone]  Name: Veronica Baker MRN: [id number] Date of Birth: 1950-09-06

## 2020-02-12 ENCOUNTER — Ambulatory Visit: Payer: Medicare HMO

## 2020-02-12 ENCOUNTER — Other Ambulatory Visit: Payer: Self-pay

## 2020-02-12 DIAGNOSIS — G8929 Other chronic pain: Secondary | ICD-10-CM

## 2020-02-12 DIAGNOSIS — M5442 Lumbago with sciatica, left side: Secondary | ICD-10-CM

## 2020-02-12 DIAGNOSIS — M6281 Muscle weakness (generalized): Secondary | ICD-10-CM

## 2020-02-12 DIAGNOSIS — R262 Difficulty in walking, not elsewhere classified: Secondary | ICD-10-CM

## 2020-02-13 NOTE — Therapy (Signed)
[facility] [address] [place], [place], [address] Phone: [phone]   Fax:  [phone]  Physical Therapy Treatment  Patient Details  Name: Veronica Baker MRN: [id number] Date of Birth: Jan 29, 1951 Referring Provider (PT): Lavada Mesi, MD   Encounter Date: 02/12/2020   PT End of Session - 02/13/20 1004    Visit Number 6    Number of Visits 13    Date for PT Re-Evaluation 03/03/20    Authorization Type AETNA MEDICARE HMO/PPO    PT Start Time 1451    PT Stop Time 1534    PT Time Calculation (min) 43 min    Activity Tolerance Patient tolerated treatment well    Behavior During Therapy [facility] for tasks assessed/performed           Past Medical History:  Diagnosis Date  . Anemia   . Arthritis   . Asthma   . Blood dyscrasia    polyclonal gamopathy  . Depression    Bipolar  . Fibromyalgia   . Headache   . Hyperlipidemia   . Hypertension   . Sarcoidosis   . Sleep apnea     Past Surgical History:  Procedure Laterality Date  . ABDOMINAL HYSTERECTOMY    . APPENDECTOMY    . ARTERY BIOPSY Right 06/16/2017   Procedure: BIOPSY TEMPORAL ARTERY RIGHT;  Surgeon: Larina Earthly, MD;  Location: [facility] OR;  Service: Vascular;  Laterality: Right;  . bilateral oophorectomy    . BREAST SURGERY    . CARDIAC CATHETERIZATION    . COLON SURGERY     colonscopy  . HERNIA REPAIR    . TUBAL LIGATION    . UMBILICAL HERNIA REPAIR      There were no vitals filed for this visit.   Subjective Assessment - 02/12/20 1500    Subjective Pt reports she has been doing well. Pt statesthe low back pain has been 0/10 to a low level. Today it is a 0/10.    Currently in Pain? No/denies    Pain Score 0-No pain                             [facility] Adult PT Treatment/Exercise - 02/13/20 0001      Transfers   Comments Min labored c proper technique      Ambulation/Gait   Gait Pattern Step-to pattern   Quality of steps and  balance is improved c RW     Exercises   Exercises Lumbar;Knee/Hip      Lumbar Exercises: Stretches   Single Knee to Chest Stretch Right;Left;2 reps;20 seconds    Lower Trunk Rotation 3 reps;10 seconds    Piriformis Stretch 2 reps;20 seconds    Other Lumbar Stretch Exercise Child's pose c large ball; forward and laterally each direction 5x; 5 sec      Lumbar Exercises: Seated   Long Arc Quad on Chair AROM;Right;Left;10 reps    LAQ on Chair Limitations 5 sec      Lumbar Exercises: Supine   Pelvic Tilt 15 reps    Pelvic Tilt Limitations 3 sec    Bent Knee Raise 10 reps    Bent Knee Raise Limitations 2 sets;       Knee/Hip Exercises: Standing   Knee Flexion Strengthening;Right;Left;1 set    Knee Flexion Limitations 12 reps, 1 hand A    Hip Flexion Stengthening;Right;Left;1 set    Hip Flexion Limitations 12 reps; 1 hand A  Hip Abduction Stengthening;Right;Left;1 set    Abduction Limitations 12 reps; 1 hand A    Other Standing Knee Exercises Front SLR; 12 resp               Balance Exercises - 02/13/20 0001      Balance Exercises: Standing   Tandem Stance Eyes open;3 reps;20 secs    SLS --   partial tandem; 3 x each foot forward              PT Short Term Goals - 01/14/20 0931      PT SHORT TERM GOAL #1   Title Pt will be ind in an initial HEP for low back/LE flexibility and strengthening to assist in pain reduction    Baseline No program    Time 3    Period Weeks    Status New    Target Date 02/04/20      PT SHORT TERM GOAL #2   Title Pt will voice understanding of pain management through positioning, support and heat/cold modalities    Baseline Decreased understanding    Time 3    Period Weeks    Status New    Target Date 02/04/20      PT SHORT TERM GOAL #3   Title Complete Berg, 5xSTS and TUG and set LTGs to measure improved functional mobility    Baseline To be completed    Time 3    Period Weeks    Status New    Target Date 02/04/20               PT Long Term Goals - 02/09/20 1259      PT LONG TERM GOAL #4   Title Improve Berg score to 45/56    Baseline 38    Status New    Target Date 02/25/20                 Plan - 02/13/20 1006    Clinical Impression Statement Pt reports she has been completing her HEP. Subjective reports of pain continue to improve. Using a RW has provided pt better assistance and gait quality and balance are improved. PT today focused on core/LE strengthening and flexibility exs and balance exs. Pt participates with good effort.    Personal Factors and Comorbidities Age;Fitness;Comorbidity 3+    Comorbidities Arhtritis, fibromyalgia, HTN, sarcoidosis    Examination-Activity Limitations Bed Mobility;Carry;Dressing;Lift;Stand;Stairs;Squat;Transfers    Stability/Clinical Decision Making Evolving/Moderate complexity    Clinical Decision Making Moderate    Rehab Potential Good    PT Frequency 2x / week    PT Duration 6 weeks    PT Treatment/Interventions ADLs/Self Care Home Management;Cryotherapy;Electrical Stimulation;DME Instruction;Ultrasound;Traction;Moist Heat;Iontophoresis 4mg /ml Dexamethasone;Gait training;Stair training;Functional mobility training;Therapeutic activities;Therapeutic exercise;Balance training;Neuromuscular re-education;Manual techniques;Patient/family education;Passive range of motion;Dry needling;Taping;Spinal Manipulations    PT Next Visit Plan Complete re-assessment    PT Home Exercise Plan Complete re-assessment including Berg    Consulted and Agree with Plan of Care Patient           Patient will benefit from skilled therapeutic intervention in order to improve the following deficits and impairments:  Difficulty walking, Decreased range of motion, Obesity, Decreased activity tolerance, Decreased knowledge of precautions, Pain, Decreased balance, Impaired flexibility, Improper body mechanics, Decreased mobility, Decreased strength, Postural dysfunction  Visit  Diagnosis: Chronic bilateral low back pain with bilateral sciatica  Difficulty in walking, not elsewhere classified  Muscle weakness (generalized)     Problem List Patient Active Problem List   Diagnosis Date  Noted  . Schizoaffective disorder ([place]) 08/28/2018  . Abnormal cardiovascular stress test 09/13/2012  . Diastolic dysfunction 18/55/0158  . SOB (shortness of breath) 09/06/2012    Gar Ponto MS, PT 02/13/20 10:14 AM  [facility] [address] [place], [place], [address] Phone: [phone]   Fax:  [phone]  Name: Veronica Baker MRN: [id number] Date of Birth: 30-Jan-1951

## 2020-02-14 ENCOUNTER — Telehealth: Payer: Self-pay

## 2020-02-14 NOTE — Telephone Encounter (Signed)
Patient brought in forms from [facility] for her disability, this is a renewal. See previous completed forms.   Paperwork completed and given to Dr. Jennelle Human to review and sign.

## 2020-02-15 ENCOUNTER — Other Ambulatory Visit: Payer: Self-pay

## 2020-02-15 ENCOUNTER — Ambulatory Visit: Payer: Medicare HMO

## 2020-02-15 DIAGNOSIS — M6281 Muscle weakness (generalized): Secondary | ICD-10-CM

## 2020-02-15 DIAGNOSIS — M5442 Lumbago with sciatica, left side: Secondary | ICD-10-CM | POA: Diagnosis not present

## 2020-02-15 DIAGNOSIS — R262 Difficulty in walking, not elsewhere classified: Secondary | ICD-10-CM

## 2020-02-15 DIAGNOSIS — G8929 Other chronic pain: Secondary | ICD-10-CM

## 2020-02-15 NOTE — Therapy (Signed)
[place], [place], [address] Phone: [phone]   Fax:  [phone]  Physical Therapy Treatment  Patient Details  Name: Veronica Baker MRN: [id number] Date of Birth: 12/03/1950 Referring Provider (PT): Eunice Blase, MD   Encounter Date: 02/15/2020   PT End of Session - 02/15/20 0756    Visit Number 7    Number of Visits 13    Date for PT Re-Evaluation 03/03/20    Authorization Type AET[employer] MEDICARE HMO/PPO    PT Start Time 0747    PT Stop Time 0845    PT Time Calculation (min) 58 min    Activity Tolerance Patient tolerated treatment well    Behavior During Therapy [facility] for tasks assessed/performed           Past Medical History:  Diagnosis Date  . Anemia   . Arthritis   . Asthma   . Blood dyscrasia    polyclonal gamopathy  . Depression    Bipolar  . Fibromyalgia   . Headache   . Hyperlipidemia   . Hypertension   . Sarcoidosis   . Sleep apnea     Past Surgical History:  Procedure Laterality Date  . ABDOMINAL HYSTERECTOMY    . APPENDECTOMY    . ARTERY BIOPSY Right 06/16/2017   Procedure: BIOPSY TEMPORAL ARTERY RIGHT;  Surgeon: Rosetta Posner, MD;  Location: [place];  Service: Vascular;  Laterality: Right;  . bilateral oophorectomy    . BREAST SURGERY    . CARDIAC CATHETERIZATION    . COLON SURGERY     colonscopy  . HERNIA REPAIR    . TUBAL LIGATION    . UMBILICAL HERNIA REPAIR      There were no vitals filed for this visit.   Subjective Assessment - 02/15/20 0751    Subjective Pt reports she is continuing to do well. primarily 0/10. pt has discomfort at times with prolonged sitting and getting up.    Currently in Pain? No/denies    Pain Score 0-No pain    Pain Location Back    Pain Orientation Posterior              [facility] PT Assessment - 02/15/20 0001      Transfers   Five time sit to stand comments  13.8 sec      Berg Balance Test   Sit to Stand Able to stand  without using hands and stabilize independently    Standing Unsupported Able to stand safely 2 minutes    Sitting with Back Unsupported but Feet Supported on Floor or Stool Able to sit safely and securely 2 minutes    Stand to Sit Sits safely with minimal use of hands    Transfers Able to transfer safely, minor use of hands    Standing Unsupported with Eyes Closed Able to stand 10 seconds safely    Standing Unsupported with Feet Together Able to place feet together independently and stand 1 minute safely    From Standing, Reach Forward with Outstretched Arm Can reach confidently >25 cm (10")    From Standing Position, Pick up Object from Floor Unable to pick up shoe, but reaches 2-5 cm (1-2") from shoe and balances independently    From Standing Position, Turn to Look Behind Over each Shoulder Turn sideways only but maintains balance    Turn 360 Degrees Able to turn 360 degrees safely but slowly    Standing Unsupported, Alternately Place Feet on Step/Stool Able to  complete >2 steps/needs minimal assist    Standing Unsupported, One Foot in Front Able to plae foot ahead of the other independently and hold 30 seconds    Standing on One Leg Able to lift leg independently and hold 5-10 seconds    Total Score 45                         [facility] Adult PT Treatment/Exercise - 02/15/20 0001      Lumbar Exercises: Stretches   Single Knee to Chest Stretch Right;Left;2 reps;20 seconds    Lower Trunk Rotation 3 reps;10 seconds      Lumbar Exercises: Seated   Long Arc Quad on Chair AROM;Right;Left;10 reps    LAQ on Chair Limitations 5 sec      Lumbar Exercises: Supine   Pelvic Tilt 15 reps    Pelvic Tilt Limitations 3 sec    Bent Knee Raise 10 reps    Bent Knee Raise Limitations 2 sets;       Knee/Hip Exercises: Aerobic   Nustep 5 mins; L5; arms and legs      Knee/Hip Exercises: Standing   Knee Flexion Strengthening;Right;Left;2 sets;10 reps    Knee Flexion Limitations 1 hand A     Hip Flexion Stengthening;Right;Left;2 sets;10 reps    Hip Flexion Limitations 1 hand A    Hip Abduction Stengthening;Right;Left;2 sets;10 reps    Abduction Limitations 1 hand A    Other Standing Knee Exercises Front SLR; 10 reps; 2 sets      Moist Heat Therapy   Number Minutes Moist Heat 15 Minutes    Moist Heat Location Lumbar Spine                    PT Short Term Goals - 02/15/20 1062      PT SHORT TERM GOAL #1   Title Pt will be ind in an initial HEP for low back/LE flexibility and strengthening to assist in pain reduction. Met    Baseline No program    Status Achieved      PT SHORT TERM GOAL #2   Title Pt will voice understanding of pain management through positioning, support and heat/cold modalities. Met    Baseline Decreased understanding    Status Achieved      PT SHORT TERM GOAL #3   Title Complete Berg, 5xSTS and TUG and set LTGs to measure improved functional mobility. Met    Status Achieved             PT Long Term Goals - 02/15/20 0840      PT LONG TERM GOAL #4   Title Improve Berg score to 45/56. Met    Baseline 38    Status Achieved      PT LONG TERM GOAL #5   Status Achieved                 Plan - 02/15/20 0843    Clinical Impression Statement Pt is making good progress. Subjective report re; pain is improved to a low level on a consistent basis, and Berg score was improved meeting the set goal. balance is most challenged by stepping and picking up objects from the floor. Pt was advised ot use a reacher or hand assist with picking up objects and to use a handrail for steps.Pt 5xSTS time wa at a good level and a LTG was not set.    Personal Factors and Comorbidities Age;Fitness;Comorbidity 3+  Comorbidities Arhtritis, fibromyalgia, HTN, sarcoidosis    Examination-Activity Limitations Bed Mobility;Carry;Dressing;Lift;Stand;Stairs;Squat;Transfers    Stability/Clinical Decision Making Evolving/Moderate complexity    Clinical  Decision Making Moderate    Rehab Potential Good    PT Frequency 2x / week    PT Duration 6 weeks    PT Treatment/Interventions ADLs/Self Care Home Management;Cryotherapy;Electrical Stimulation;DME Instruction;Ultrasound;Traction;Moist Heat;Iontophoresis 83m/ml Dexamethasone;Gait training;Stair training;Functional mobility training;Therapeutic activities;Therapeutic exercise;Balance training;Neuromuscular re-education;Manual techniques;Patient/family education;Passive range of motion;Dry needling;Taping;Spinal Manipulations    PT Next Visit Plan Introduce back strengtheninng exs    PT Home Exercise Plan [id number]    Consulted and Agree with Plan of Care Patient           Patient will benefit from skilled therapeutic intervention in order to improve the following deficits and impairments:  Difficulty walking, Decreased range of motion, Obesity, Decreased activity tolerance, Decreased knowledge of precautions, Pain, Decreased balance, Impaired flexibility, Improper body mechanics, Decreased mobility, Decreased strength, Postural dysfunction  Visit Diagnosis: Chronic bilateral low back pain with bilateral sciatica  Difficulty in walking, not elsewhere classified  Muscle weakness (generalized)     Problem List Patient Active Problem List   Diagnosis Date Noted  . Sch[place]ive disorder ([place] 08/28/2018  . Abnormal cardiovascular stress test 09/13/2012  . Diastolic dysfunction 022/84/0698 . SOB (shortness of breath) 09/06/2012    AGar PontoMS, PT 02/15/20 8:54 AM  [facility] [address]Phone: [phone]  Fax:  [phone] Name: Veronica MECHEMRN: [id number] of Birth: 3Jun 12, 1952

## 2020-02-21 ENCOUNTER — Other Ambulatory Visit: Payer: Self-pay

## 2020-02-21 ENCOUNTER — Ambulatory Visit: Payer: Medicare HMO

## 2020-02-21 DIAGNOSIS — R262 Difficulty in walking, not elsewhere classified: Secondary | ICD-10-CM

## 2020-02-21 DIAGNOSIS — M5442 Lumbago with sciatica, left side: Secondary | ICD-10-CM | POA: Diagnosis not present

## 2020-02-21 DIAGNOSIS — M6281 Muscle weakness (generalized): Secondary | ICD-10-CM

## 2020-02-21 DIAGNOSIS — G8929 Other chronic pain: Secondary | ICD-10-CM

## 2020-02-21 NOTE — Therapy (Signed)
[place], [place], [address] Phone: [phone]   Fax:  [phone]  Physical Therapy Treatment  Patient Details  Name: Veronica Baker MRN: [id number] Date of Birth: 1950/09/04 Referring Provider (PT): Eunice Blase, MD   Encounter Date: 02/21/2020   PT End of Session - 02/21/20 0953    Visit Number 8    Number of Visits 13    Date for PT Re-Evaluation 03/03/20    Authorization Type AETNA MEDICARE HMO/PPO    PT Start Time 0753    PT Stop Time 0848    PT Time Calculation (min) 55 min    Activity Tolerance Patient tolerated treatment well    Behavior During Therapy [facility] for tasks assessed/performed           Past Medical History:  Diagnosis Date  . Anemia   . Arthritis   . Asthma   . Blood dyscrasia    polyclonal gamopathy  . Depression    Bipolar  . Fibromyalgia   . Headache   . Hyperlipidemia   . Hypertension   . Sarcoidosis   . Sleep apnea     Past Surgical History:  Procedure Laterality Date  . ABDOMINAL HYSTERECTOMY    . APPENDECTOMY    . ARTERY BIOPSY Right 06/16/2017   Procedure: BIOPSY TEMPORAL ARTERY RIGHT;  Surgeon: Rosetta Posner, MD;  Location: [place];  Service: Vascular;  Laterality: Right;  . bilateral oophorectomy    . BREAST SURGERY    . CARDIAC CATHETERIZATION    . COLON SURGERY     colonscopy  . HERNIA REPAIR    . TUBAL LIGATION    . UMBILICAL HERNIA REPAIR      There were no vitals filed for this visit.   Subjective Assessment - 02/21/20 0803    Subjective Pt reports she has not had any back pain since the last time she was here.    Currently in Pain? No/denies    Pain Score 0-No pain    Pain Location Back    Pain Orientation Posterior;Lower    Pain Onset 1 to 4 weeks ago    Pain Frequency Intermittent                             [facility] Adult PT Treatment/Exercise - 02/21/20 0001      Exercises   Exercises Lumbar;Knee/Hip       Lumbar Exercises: Stretches   Single Knee to Chest Stretch Right;Left;2 reps;20 seconds    Lower Trunk Rotation 5 reps;10 seconds    Piriformis Stretch 2 reps;20 seconds      Lumbar Exercises: Standing   Scapular Retraction Strengthening;Both;15 reps;Theraband    Theraband Level (Scapular Retraction) Level 3 (Green)    Shoulder Extension Strengthening;Both;15 reps;Theraband      Lumbar Exercises: Supine   Pelvic Tilt 15 reps    Pelvic Tilt Limitations 3 sec    Bent Knee Raise 10 reps    Bent Knee Raise Limitations 3 sets      Moist Heat Therapy   Number Minutes Moist Heat 15 Minutes    Moist Heat Location Lumbar Spine                  PT Education - 02/21/20 0951    Education Details HEP: TBand exs for back/shoulder/core strengthening were added.    Person(s) Educated Patient    Methods Explanation;Demonstration;Tactile cues;Verbal cues;Handout  Comprehension Verbalized understanding;Returned demonstration;Verbal cues required;Tactile cues required;Need further instruction            PT Short Term Goals - 02/15/20 7939      PT SHORT TERM GOAL #1   Title Pt will be ind in an initial HEP for low back/LE flexibility and strengthening to assist in pain reduction. Met    Baseline No program    Status Achieved      PT SHORT TERM GOAL #2   Title Pt will voice understanding of pain management through positioning, support and heat/cold modalities. Met    Baseline Decreased understanding    Status Achieved      PT SHORT TERM GOAL #3   Title Complete Berg, 5xSTS and TUG and set LTGs to measure improved functional mobility. Met    Status Achieved             PT Long Term Goals - 02/15/20 0840      PT LONG TERM GOAL #4   Title Improve Berg score to 45/56. Met    Baseline 38    Status Achieved      PT LONG TERM GOAL #5   Status Achieved                 Plan - 02/21/20 0957    Clinical Impression Statement Pt's ther ex and HEP was progressed to  include back/sh/core strengthening exs. Pt tolerated the addition of these exs. pt mobility continues to be improved with greater ease of mobility, although there is an underlying quality issue which appears to be more of a neurological isse than orthopedic.    Personal Factors and Comorbidities Age;Fitness;Comorbidity 3+    Comorbidities Arhtritis, fibromyalgia, HTN, sarcoidosis    Examination-Activity Limitations Bed Mobility;Carry;Dressing;Lift;Stand;Stairs;Squat;Transfers    Stability/Clinical Decision Making Evolving/Moderate complexity    Clinical Decision Making Moderate    Rehab Potential Good    PT Frequency 2x / week    PT Duration 6 weeks    PT Treatment/Interventions ADLs/Self Care Home Management;Cryotherapy;Electrical Stimulation;DME Instruction;Ultrasound;Traction;Moist Heat;Iontophoresis 44m/ml Dexamethasone;Gait training;Stair training;Functional mobility training;Therapeutic activities;Therapeutic exercise;Balance training;Neuromuscular re-education;Manual techniques;Patient/family education;Passive range of motion;Dry needling;Taping;Spinal Manipulations    PT Next Visit Plan Introduce back strengtheninng exs    PT Home Exercise Plan [id number]. Tband strengthening for back/sh/core    Consulted and Agree with Plan of Care Patient           Patient will benefit from skilled therapeutic intervention in order to improve the following deficits and impairments:  Difficulty walking, Decreased range of motion, Obesity, Decreased activity tolerance, Decreased knowledge of precautions, Pain, Decreased balance, Impaired flexibility, Improper body mechanics, Decreased mobility, Decreased strength, Postural dysfunction  Visit Diagnosis: Difficulty in walking, not elsewhere classified  Muscle weakness (generalized)  Chronic bilateral low back pain with bilateral sciatica     Problem List Patient Active Problem List   Diagnosis Date Noted  . Schizoaffective disorder ([place]  08/28/2018  . Abnormal cardiovascular stress test 09/13/2012  . Diastolic dysfunction 003/00/9233 . SOB (shortness of breath) 09/06/2012    AGar PontoMS, PT 02/21/20 10:05 AM  [facility]GClearlake [place] [address]Phone: [phone]  Fax:  [phone] Name: CSHAYANN GARBUTTMRN: [id number] of Birth: 3September 29, 1952

## 2020-02-23 ENCOUNTER — Ambulatory Visit: Payer: Medicare HMO

## 2020-02-23 ENCOUNTER — Other Ambulatory Visit: Payer: Self-pay

## 2020-02-23 DIAGNOSIS — M5442 Lumbago with sciatica, left side: Secondary | ICD-10-CM | POA: Diagnosis not present

## 2020-02-23 DIAGNOSIS — M6281 Muscle weakness (generalized): Secondary | ICD-10-CM

## 2020-02-23 DIAGNOSIS — M5441 Lumbago with sciatica, right side: Secondary | ICD-10-CM

## 2020-02-23 DIAGNOSIS — G8929 Other chronic pain: Secondary | ICD-10-CM

## 2020-02-23 DIAGNOSIS — R262 Difficulty in walking, not elsewhere classified: Secondary | ICD-10-CM

## 2020-02-23 NOTE — Therapy (Signed)
[place], [place], [address] Phone: [phone]   Fax:  [phone]  Physical Therapy Treatment  Patient Details  Name: Veronica Baker MRN: [id number] Date of Birth: 27-Feb-1951 Referring Provider (PT): Eunice Blase, MD   Encounter Date: 02/23/2020   PT End of Session - 02/23/20 1258    Visit Number 9    Number of Visits 13    Date for PT Re-Evaluation 03/03/20    Authorization Type AETNA MEDICARE HMO/PPO    PT Start Time 1002    PT Stop Time 1044    PT Time Calculation (min) 42 min    Activity Tolerance Patient tolerated treatment well    Behavior During Therapy [facility] for tasks assessed/performed           Past Medical History:  Diagnosis Date  . Anemia   . Arthritis   . Asthma   . Blood dyscrasia    polyclonal gamopathy  . Depression    Bipolar  . Fibromyalgia   . Headache   . Hyperlipidemia   . Hypertension   . Sarcoidosis   . Sleep apnea     Past Surgical History:  Procedure Laterality Date  . ABDOMINAL HYSTERECTOMY    . APPENDECTOMY    . ARTERY BIOPSY Right 06/16/2017   Procedure: BIOPSY TEMPORAL ARTERY RIGHT;  Surgeon: Rosetta Posner, MD;  Location: [place];  Service: Vascular;  Laterality: Right;  . bilateral oophorectomy    . BREAST SURGERY    . CARDIAC CATHETERIZATION    . COLON SURGERY     colonscopy  . HERNIA REPAIR    . TUBAL LIGATION    . UMBILICAL HERNIA REPAIR      There were no vitals filed for this visit.   Subjective Assessment - 02/23/20 1008    Subjective Pt offered no concerns. Pt reports no pain currrently, but has occassional pain of different areas; knees, thighs.                             [place] Adult PT Treatment/Exercise - 02/23/20 0001      Lumbar Exercises: Aerobic   Nustep 5 mins;L5; arms and legs      Lumbar Exercises: Standing   Scapular Retraction Strengthening;Both;15 reps;Theraband    Theraband Level (Scapular  Retraction) Level 3 (Green)    Scapular Retraction Limitations 2 sets    Shoulder Extension Strengthening;Both;15 reps;Theraband    Shoulder Extension Limitations 2 sets    Other Standing Lumbar Exercises Bicep curl 15x2; green Tband      Knee/Hip Exercises: Stretches   Hip Flexor Stretch Right;Left;3 reps;20 seconds      Knee/Hip Exercises: Standing   Knee Flexion Strengthening;Right;Left;2 sets;15 reps    Knee Flexion Limitations 1 hand A    Hip Flexion Stengthening;Right;Left;2 sets;15 reps    Hip Flexion Limitations 1 hand A    Hip Abduction Stengthening;Right;Left;2 sets;15 reps    Abduction Limitations 1 hand A    Other Standing Knee Exercises Front SLR; 15 reps; 2 sets    Other Standing Knee Exercises Hip hike; 15x                  PT Education - 02/23/20 1257    Education Details HEP: hip flexion stretch and hip hike ex added    Person(s) Educated Patient    Methods Explanation;Demonstration;Tactile cues;Verbal cues;Handout    Comprehension Verbalized understanding;Returned demonstration;Verbal cues required;Tactile cues  required;Need further instruction            PT Short Term Goals - 02/15/20 1017      PT SHORT TERM GOAL #1   Title Pt will be ind in an initial HEP for low back/LE flexibility and strengthening to assist in pain reduction. Met    Baseline No program    Status Achieved      PT SHORT TERM GOAL #2   Title Pt will voice understanding of pain management through positioning, support and heat/cold modalities. Met    Baseline Decreased understanding    Status Achieved      PT SHORT TERM GOAL #3   Title Complete Berg, 5xSTS and TUG and set LTGs to measure improved functional mobility. Met    Status Achieved             PT Long Term Goals - 02/15/20 0840      PT LONG TERM GOAL #4   Title Improve Berg score to 45/56. Met    Baseline 38    Status Achieved      PT LONG TERM GOAL #5   Status Achieved                 Plan -  02/23/20 1259    Clinical Impression Statement PT focused on pt's forward flexed posture through corrective exs c hip flexion stretch, standing exs for back extensors with Tband, and ,  hip extension exs in standing. Adiitionally, safety with the 4wrw was reviewed for stepping backward and proper technique to avoid loss of balance backwards. Pt returned demonstration with exs and and backing up properly c the 4wrw.    Personal Factors and Comorbidities Age;Fitness;Comorbidity 3+    Comorbidities Arhtritis, fibromyalgia, HTN, sarcoidosis    Examination-Activity Limitations Bed Mobility;Carry;Dressing;Lift;Stand;Stairs;Squat;Transfers    Stability/Clinical Decision Making Evolving/Moderate complexity    Clinical Decision Making Moderate    Rehab Potential Good    PT Frequency 2x / week    PT Duration 6 weeks    PT Treatment/Interventions ADLs/Self Care Home Management;Cryotherapy;Electrical Stimulation;DME Instruction;Ultrasound;Traction;Moist Heat;Iontophoresis 1m/ml Dexamethasone;Gait training;Stair training;Functional mobility training;Therapeutic activities;Therapeutic exercise;Balance training;Neuromuscular re-education;Manual techniques;Patient/family education;Passive range of motion;Dry needling;Taping;Spinal Manipulations    PT Next Visit Plan Continue to address forward flexed posture    PT Home Exercise Plan [id number]. hip hike and hip flexion stretch    Consulted and Agree with Plan of Care Patient           Patient will benefit from skilled therapeutic intervention in order to improve the following deficits and impairments:  Difficulty walking, Decreased range of motion, Obesity, Decreased activity tolerance, Decreased knowledge of precautions, Pain, Decreased balance, Impaired flexibility, Improper body mechanics, Decreased mobility, Decreased strength, Postural dysfunction  Visit Diagnosis: Difficulty in walking, not elsewhere classified  Muscle weakness (generalized)  Chronic  bilateral low back pain with bilateral sciatica     Problem List Patient Active Problem List   Diagnosis Date Noted  . Schizoaffective disorder ([place] 08/28/2018  . Abnormal cardiovascular stress test 09/13/2012  . Diastolic dysfunction 051/10/5850 . SOB (shortness of breath) 09/06/2012    AGar PontoMS, PT 02/23/20 1:09 PM  C[facility]18[address]GKaty N[place] [address]Phone: [phone]  Fax:  [phone] Name: CADIANNA DARWINMRN: [id number] of Birth: [birth year]

## 2020-02-26 ENCOUNTER — Ambulatory Visit: Payer: Medicare HMO

## 2020-02-26 ENCOUNTER — Other Ambulatory Visit: Payer: Self-pay

## 2020-02-26 DIAGNOSIS — M5442 Lumbago with sciatica, left side: Secondary | ICD-10-CM | POA: Diagnosis not present

## 2020-02-26 DIAGNOSIS — R262 Difficulty in walking, not elsewhere classified: Secondary | ICD-10-CM

## 2020-02-26 DIAGNOSIS — G8929 Other chronic pain: Secondary | ICD-10-CM

## 2020-02-26 DIAGNOSIS — M6281 Muscle weakness (generalized): Secondary | ICD-10-CM

## 2020-02-26 NOTE — Therapy (Signed)
[place], [place], [address] Phone: [phone]   Fax:  [phone]  Physical Therapy Treatment/Progress Note  Progress Note Reporting Period 5/14//21 to 02/26/20  See note below for Objective Data and Assessment of Progress/Goals.      Patient Details  Name: Veronica Baker MRN: [id number] Date of Birth: 09/03/1950 Referring Provider (PT): Eunice Blase, MD   Encounter Date: 02/26/2020   PT End of Session - 02/26/20 1205    Visit Number 10    Number of Visits 13    Date for PT Re-Evaluation 03/03/20    Authorization Type AETNA MEDICARE HMO/PPO    PT Start Time 1136    PT Stop Time 1220    PT Time Calculation (min) 44 min    Activity Tolerance Patient tolerated treatment well    Behavior During Therapy [facility] for tasks assessed/performed           Past Medical History:  Diagnosis Date  . Anemia   . Arthritis   . Asthma   . Blood dyscrasia    polyclonal gamopathy  . Depression    Bipolar  . Fibromyalgia   . Headache   . Hyperlipidemia   . Hypertension   . Sarcoidosis   . Sleep apnea     Past Surgical History:  Procedure Laterality Date  . ABDOMINAL HYSTERECTOMY    . APPENDECTOMY    . ARTERY BIOPSY Right 06/16/2017   Procedure: BIOPSY TEMPORAL ARTERY RIGHT;  Surgeon: Rosetta Posner, MD;  Location: [place];  Service: Vascular;  Laterality: Right;  . bilateral oophorectomy    . BREAST SURGERY    . CARDIAC CATHETERIZATION    . COLON SURGERY     colonscopy  . HERNIA REPAIR    . TUBAL LIGATION    . UMBILICAL HERNIA REPAIR      There were no vitals filed for this visit.   Subjective Assessment - 02/26/20 1148    Subjective Pt reports she is continuing to do well and denies pain of her low back. Pt reports a pain range of 0-4 c daily activities.    Currently in Pain? No/denies    Pain Score 0-No pain                             [facility] Adult PT Treatment/Exercise  - 02/26/20 0001      Lumbar Exercises: Stretches   Active Hamstring Stretch Right;Left;2 reps;20 seconds    Active Hamstring Stretch Limitations sitting    Single Knee to Chest Stretch Right;Left;2 reps;20 seconds    Lower Trunk Rotation 5 reps;10 seconds    Piriformis Stretch 2 reps;20 seconds      Lumbar Exercises: Aerobic   Tread Mill      Nustep 8 mins;L5; arms and legs      Lumbar Exercises: Standing   Scapular Retraction Strengthening;Both;15 reps;Theraband    Theraband Level (Scapular Retraction) Level 3 (Green)    Scapular Retraction Limitations 2 sets    Shoulder Extension Strengthening;Both;15 reps;Theraband    Shoulder Extension Limitations 2 sets    Other Standing Lumbar Exercises Bicep curl 15x2; green Tband    Other Standing Lumbar Exercises Palloff 15x2; each side      Lumbar Exercises: Supine   Pelvic Tilt 15 reps    Pelvic Tilt Limitations 3 sec    Bent Knee Raise 10 reps    Bent Knee Raise Limitations 3 sets  PT Education - 02/26/20 1212    Education Details HEP: Reviewed for LE flexibility and core strengthening exs.    Person(s) Educated Patient    Methods Explanation;Demonstration;Verbal cues;Handout    Comprehension Verbalized understanding;Returned demonstration            PT Short Term Goals - 02/15/20 6294      PT SHORT TERM GOAL #1   Title Pt will be ind in an initial HEP for low back/LE flexibility and strengthening to assist in pain reduction. Met    Baseline No program    Status Achieved      PT SHORT TERM GOAL #2   Title Pt will voice understanding of pain management through positioning, support and heat/cold modalities. Met    Baseline Decreased understanding    Status Achieved      PT SHORT TERM GOAL #3   Title Complete Berg, 5xSTS and TUG and set LTGs to measure improved functional mobility. Met    Status Achieved             PT Long Term Goals - 02/26/20 1359      PT LONG TERM GOAL #2   Title Pt  will rate LBP in a range of 0-4/10 with daily activities. Met- 0-4    Baseline 0-8/10    Status Achieved                 Plan - 02/26/20 1355    Clinical Impression Statement PT focused on core and Subjective report indicates improved and managed low back  pain.    Personal Factors and Comorbidities Age;Fitness;Comorbidity 3+    Comorbidities Arhtritis, fibromyalgia, HTN, sarcoidosis    Examination-Activity Limitations Bed Mobility;Carry;Dressing;Lift;Stand;Stairs;Squat;Transfers    Stability/Clinical Decision Making Evolving/Moderate complexity    Clinical Decision Making Moderate    Rehab Potential Good    PT Frequency 2x / week    PT Duration 6 weeks    PT Treatment/Interventions ADLs/Self Care Home Management;Cryotherapy;Electrical Stimulation;DME Instruction;Ultrasound;Traction;Moist Heat;Iontophoresis 32m/ml Dexamethasone;Gait training;Stair training;Functional mobility training;Therapeutic activities;Therapeutic exercise;Balance training;Neuromuscular re-education;Manual techniques;Patient/family education;Passive range of motion;Dry needling;Taping;Spinal Manipulations    PT Next Visit Plan Review and address LE strengthening for balance and hip flexor stretches. Probable DC.    PT Home Exercise Plan [id number].    Consulted and Agree with Plan of Care Patient           Patient will benefit from skilled therapeutic intervention in order to improve the following deficits and impairments:  Difficulty walking, Decreased range of motion, Obesity, Decreased activity tolerance, Decreased knowledge of precautions, Pain, Decreased balance, Impaired flexibility, Improper body mechanics, Decreased mobility, Decreased strength, Postural dysfunction  Visit Diagnosis: Difficulty in walking, not elsewhere classified  Muscle weakness (generalized)  Chronic bilateral low back pain with bilateral sciatica     Problem List Patient Active Problem List   Diagnosis Date Noted  .  Schizoaffective disorder ([place] 08/28/2018  . Abnormal cardiovascular stress test 09/13/2012  . Diastolic dysfunction 076/54/6503 . SOB (shortness of breath) 09/06/2012    AGar PontoMS, PT 02/26/20 2:03 PM  CC[facility]CMusc[facility]GCrom[place] [address]Phone: [phone]  Fax:  [phone] Name: CMARCINE GADWAYMRN: [id number] of Birth: 3Oct 21, 1952

## 2020-02-28 ENCOUNTER — Other Ambulatory Visit: Payer: Self-pay

## 2020-02-28 ENCOUNTER — Ambulatory Visit: Payer: Medicare HMO

## 2020-02-28 DIAGNOSIS — M5442 Lumbago with sciatica, left side: Secondary | ICD-10-CM

## 2020-02-28 DIAGNOSIS — R262 Difficulty in walking, not elsewhere classified: Secondary | ICD-10-CM

## 2020-02-28 DIAGNOSIS — M6281 Muscle weakness (generalized): Secondary | ICD-10-CM

## 2020-02-28 DIAGNOSIS — G8929 Other chronic pain: Secondary | ICD-10-CM

## 2020-02-28 NOTE — Therapy (Signed)
[place], [place], [address] Phone: [phone]   Fax:  [phone]  Physical Therapy Treatment/Discharge Summary  Patient Details  Name: EDDA OREA MRN: [id number] Date of Birth: [birth year] Referring Provider (PT): Eunice Blase, MD   Encounter Date: 02/28/2020   PT End of Session - 02/28/20 1310    Visit Number 11    Date for PT Re-Evaluation 03/03/20    Authorization Type [employer] MEDICARE HMO/PPO    PT Start Time 1142    PT Stop Time 1225    PT Time Calculation (min) 43 min    Activity Tolerance Patient tolerated treatment well    Behavior During Therapy [facility] for tasks assessed/performed           Past Medical History:  Diagnosis Date  . Anemia   . Arthritis   . Asthma   . Blood dyscrasia    polyclonal gamopathy  . Depression    Bipolar  . Fibromyalgia   . Headache   . Hyperlipidemia   . Hypertension   . Sarcoidosis   . Sleep apnea     Past Surgical History:  Procedure Laterality Date  . ABDOMINAL HYSTERECTOMY    . APPENDECTOMY    . ARTERY BIOPSY Right 06/16/2017   Procedure: BIOPSY TEMPORAL ARTERY RIGHT;  Surgeon: Rosetta Posner, MD;  Location: [facility];  Service: Vascular;  Laterality: Right;  . bilateral oophorectomy    . BREAST SURGERY    . CARDIAC CATHETERIZATION    . COLON SURGERY     colonscopy  . HERNIA REPAIR    . TUBAL LIGATION    . UMBILICAL HERNIA REPAIR      There were no vitals filed for this visit.   Subjective Assessment - 02/28/20 1148    Subjective pt reports she is doing well. pt denies low back pain today.    Pain Score 0-No pain   no pain today, but a range of 0-4/10   Pain Onset 1 to 4 weeks ago    Pain Frequency Occasional                             [facility] Adult PT Treatment/Exercise - 02/28/20 0001      Self-Care   Self-Care Other Self-Care Comments    Other Self-Care Comments  Rview of final HEP      Exercises    Exercises Lumbar;Knee/Hip      Lumbar Exercises: Stretches   Hip Flexor Stretch 3 reps;20 seconds    Hip Flexor Stretch Limitations standing      Lumbar Exercises: Aerobic   Nustep 6 mins;L5; arms and legs      Knee/Hip Exercises: Standing   Heel Raises Both;10 reps    Knee Flexion Strengthening;Left;Both;10 reps    Knee Flexion Limitations 1 hand A    Hip Flexion Stengthening;10 reps    Hip Flexion Limitations 1 hand A    Hip Abduction Stengthening;10 reps    Abduction Limitations 1 hand A    Hip Extension Stengthening;Right;Left;10 reps    Extension Limitations 1 hand A    Wall Squat 10 reps    Other Standing Knee Exercises Front SLR; 10 reps; 2 sets    Other Standing Knee Exercises Hip hike; 10x               Balance Exercises - 02/28/20 0001      Balance Exercises: Standing   Tandem Stance  Eyes open;3 reps;20 secs    SLS --   partial tandem; 3 x each foot forward            PT Education - 02/28/20 1308    Education Details HEP: reviewd hip flexor stretch and standing strengthening and balance exs.    Person(s) Educated Patient    Methods Explanation;Handout    Comprehension Verbalized understanding;Returned demonstration            PT Short Term Goals - 02/15/20 0838      PT SHORT TERM GOAL #1   Title Pt will be ind in an initial HEP for low back/LE flexibility and strengthening to assist in pain reduction. Met    Baseline No program    Status Achieved      PT SHORT TERM GOAL #2   Title Pt will voice understanding of pain management through positioning, support and heat/cold modalities. Met    Baseline Decreased understanding    Status Achieved      PT SHORT TERM GOAL #3   Title Complete Berg, 5xSTS and TUG and set LTGs to measure improved functional mobility. Met    Status Achieved             PT Long Term Goals - 02/28/20 1155      PT LONG TERM GOAL #1   Title Increase pt's LE strength to 4+/5 for improved functional mobility. Met-B LEs  = 5/5    Baseline 4/5    Period Weeks    Status Achieved      PT LONG TERM GOAL #2   Title Pt will rate LBP in a range of 0-4/10 with daily activities. Met- 0-4 c occasional pain.    Status Achieved      PT LONG TERM GOAL #3   Title Pt will be ind in a final HEP for low back/LE flexibility and strengthening to assist in pain reduction and improve function. MET    Status Achieved                 Plan - 02/28/20 1311    Clinical Impression Statement PT focused on review and completion of hip flexor stretch and standing LE exs for strengthening nad balance. pt continues to do well c low back pain well managed and with pt demonstrated improved functional mobility. Pt is Ind in a HEP to continue with her rehab at home. Pt is DC from PT with goals met.    Personal Factors and Comorbidities Age;Fitness;Comorbidity 3+    Comorbidities Arhtritis, fibromyalgia, HTN, sarcoidosis    PT Treatment/Interventions ADLs/Self Care Home Management;Cryotherapy;Electrical Stimulation;DME Instruction;Ultrasound;Traction;Moist Heat;Iontophoresis 66m/ml Dexamethasone;Gait training;Stair training;Functional mobility training;Therapeutic activities;Therapeutic exercise;Balance training;Neuromuscular re-education;Manual techniques;Patient/family education;Passive range of motion;Dry needling;Taping;Spinal Manipulations    PT Home Exercise Plan [id number].    Consulted and Agree with Plan of Care Patient           Patient will benefit from skilled therapeutic intervention in order to improve the following deficits and impairments:  Difficulty walking, Decreased range of motion, Obesity, Decreased activity tolerance, Decreased knowledge of precautions, Pain, Decreased balance, Impaired flexibility, Improper body mechanics, Decreased mobility, Decreased strength, Postural dysfunction  Visit Diagnosis: Difficulty in walking, not elsewhere classified  Muscle weakness (generalized)  Chronic bilateral low back  pain with bilateral sciatica     Problem List Patient Active Problem List   Diagnosis Date Noted  . Schizoaffective disorder ([facility] [place] 08/28/2018  . Abnormal cardiovascular stress test 09/13/2012  . Diastolic dysfunction 075/64/3329 . SOB (  shortness of breath) 09/06/2012   Gar Ponto MS, PT 02/28/20 1:17 PM   PHYSICAL THERAPY DISCHARGE SUMMARY  Visits from Start of Care: 11  Current functional level related to goals / functional outcomes: See above    Remaining deficits: See above   Education / Equipment: HEP Plan: Patient agrees to discharge.  Patient goals were met. Patient is being discharged due to a change in medical status.  ?????       [place], [place], [address] Phone: [phone]   Fax:  [phone]  Name: CAMRYNN MCCLINTIC MRN: [id number] Date of Birth: Sep 29, 1950

## 2020-04-03 ENCOUNTER — Ambulatory Visit (INDEPENDENT_AMBULATORY_CARE_PROVIDER_SITE_OTHER): Payer: Medicare HMO

## 2020-04-03 ENCOUNTER — Encounter: Payer: Self-pay | Admitting: Family Medicine

## 2020-04-03 ENCOUNTER — Other Ambulatory Visit: Payer: Self-pay

## 2020-04-03 ENCOUNTER — Ambulatory Visit (INDEPENDENT_AMBULATORY_CARE_PROVIDER_SITE_OTHER): Payer: Medicare HMO | Admitting: Family Medicine

## 2020-04-03 DIAGNOSIS — M25562 Pain in left knee: Secondary | ICD-10-CM

## 2020-04-03 DIAGNOSIS — M25522 Pain in left elbow: Secondary | ICD-10-CM

## 2020-04-03 MED ORDER — CELECOXIB 200 MG PO CAPS
200.0000 mg | ORAL_CAPSULE | Freq: Two times a day (BID) | ORAL | 6 refills | Status: DC | PRN
Start: 2020-04-03 — End: 2021-03-05

## 2020-04-03 NOTE — Progress Notes (Signed)
Office Visit Note   Patient: Veronica Baker           Date of Birth: 1950-10-14           MRN: [id number] Visit Date: 04/03/2020 Requested by: Stevphen Rochester, MD [address] [place],  [place] [address] PCP: Stevphen Rochester, MD  Subjective: Chief Complaint  Patient presents with  . Left Knee - Pain    S/p fall approximately 1 week ago, landing on left side. Pain anterior knee. Hurts to walk. Using a rolling walker today, but usually uses a cane.  . Left Elbow - Pain    Pain lateral aspect of elbow since the fall.    HPI: She is here with left elbow and left knee pain.  About a week ago she fell landing on her left side.  Able to get up and walk, but has had pain on the lateral elbow and anterior left knee since then.  She is using a walker for ambulation.  Ordinarily she uses a cane.  She has tried baclofen with some relief.              ROS:   All other systems were reviewed and are negative.  Objective: Vital Signs: There were no vitals taken for this visit.  Physical Exam:  General:  Alert and oriented, in no acute distress. Pulm:  Breathing unlabored. Psy:  Normal mood, congruent affect. Skin: No abrasions Left elbow: No joint effusion, full range of motion.  She is tender near the lateral epicondyle.  She has some pain with wrist extension against resistance.  Ligaments feel stable. Left knee: No effusion, full active extension.  Flexion of 120 degrees.  She has some tenderness along the patella tendon.  No significant joint line tenderness, no pain or click with McMurray's.  No laxity with varus or valgus stress.   Imaging: XR Elbow 2 Views Left  Result Date: 04/03/2020 X-rays left elbow reveal mild degenerative change, no obvious fracture.  No fat pad sign.  XR Knee 1-2 Views Left  Result Date: 04/03/2020 X-rays left knee reveal moderate medial compartment DJD.  I do not see an acute fracture.   Assessment & Plan: 1.  1 week status post fall  with left elbow and knee contusions -We will try Celebrex as needed, continue with baclofen as needed.  If symptoms persist, could consider cortisone injection.     Procedures: No procedures performed  No notes on file     PMFS History: Patient Active Problem List   Diagnosis Date Noted  . Age-related incipient cataract of both eyes 01/15/2020  . History of colonic polyps 01/15/2020  . Primary open angle glaucoma (POAG) 01/15/2020  . Snoring 01/15/2020  . Hypertensive disorder 10/04/2019  . Chronic heart failure (HCC) 05/11/2019  . Chronic low back pain without sciatica 02/22/2019  . Hypertensive urgency 02/22/2019  . Schizoaffective disorder (HCC) 08/28/2018  . Referred ear pain, left 08/15/2018  . Tongue lesion 08/15/2018  . Hypercholesterolemia 05/09/2018  . Moderate persistent asthma without complication 05/27/2015  . Obesity (BMI 30-39.9) 05/27/2015  . Bipolar affective disorder, currently active (HCC) 05/09/2015  . Chronic back pain 05/09/2015  . History of sarcoidosis 05/09/2015  . Abnormal cardiovascular stress test 09/13/2012  . Diastolic dysfunction 09/13/2012  . SOB (shortness of breath) 09/06/2012   Past Medical History:  Diagnosis Date  . Anemia   . Arthritis   . Asthma   . Blood dyscrasia  polyclonal gamopathy  . Depression    Bipolar  . Fibromyalgia   . Headache   . Hyperlipidemia   . Hypertension   . Sarcoidosis   . Sleep apnea     Family History  Problem Relation Age of Onset  . Hypertension Brother   . Coronary artery disease Brother   . Asthma Brother   . Alzheimer's disease Mother   . Healthy Son   . Healthy Son     Past Surgical History:  Procedure Laterality Date  . ABDOMINAL HYSTERECTOMY    . APPENDECTOMY    . ARTERY BIOPSY Right 06/16/2017   Procedure: BIOPSY TEMPORAL ARTERY RIGHT;  Surgeon: Larina Earthly, MD;  Location: [facility] OR;  Service: Vascular;  Laterality: Right;  . bilateral oophorectomy    . BREAST SURGERY    .  CARDIAC CATHETERIZATION    . COLON SURGERY     colonscopy  . HERNIA REPAIR    . TUBAL LIGATION    . UMBILICAL HERNIA REPAIR     Social History   Occupational History  . Not on file  Tobacco Use  . Smoking status: Never Smoker  . Smokeless tobacco: Never Used  Vaping Use  . Vaping Use: Never used  Substance and Sexual Activity  . Alcohol use: No  . Drug use: No  . Sexual activity: Not on file

## 2020-04-16 ENCOUNTER — Telehealth: Payer: Self-pay

## 2020-04-16 MED ORDER — NABUMETONE 500 MG PO TABS
500.0000 mg | ORAL_TABLET | Freq: Two times a day (BID) | ORAL | 3 refills | Status: DC | PRN
Start: 2020-04-16 — End: 2020-07-30

## 2020-04-16 NOTE — Telephone Encounter (Signed)
Patient called in saying that the medication she was prescribed is making her skin break out . Wanting to get an alternative medication.

## 2020-04-16 NOTE — Telephone Encounter (Signed)
I called and advised the patient of the change in medication. She is using hydrocortisone cream on the rash - it is helping. Celebrex has been added to her allergy list.

## 2020-04-16 NOTE — Telephone Encounter (Signed)
Relafen Rx sent.

## 2020-04-16 NOTE — Telephone Encounter (Signed)
She was given Celebrex. Please advise.

## 2020-04-21 ENCOUNTER — Other Ambulatory Visit: Payer: Self-pay | Admitting: Family Medicine

## 2020-07-16 ENCOUNTER — Other Ambulatory Visit: Payer: Self-pay | Admitting: Psychiatry

## 2020-07-16 DIAGNOSIS — F411 Generalized anxiety disorder: Secondary | ICD-10-CM

## 2020-07-16 DIAGNOSIS — F251 Schizoaffective disorder, depressive type: Secondary | ICD-10-CM

## 2020-07-30 ENCOUNTER — Other Ambulatory Visit: Payer: Self-pay

## 2020-07-30 ENCOUNTER — Ambulatory Visit (INDEPENDENT_AMBULATORY_CARE_PROVIDER_SITE_OTHER): Payer: Medicare HMO | Admitting: Family Medicine

## 2020-07-30 ENCOUNTER — Encounter: Payer: Self-pay | Admitting: Family Medicine

## 2020-07-30 DIAGNOSIS — M48062 Spinal stenosis, lumbar region with neurogenic claudication: Secondary | ICD-10-CM | POA: Diagnosis not present

## 2020-07-30 MED ORDER — GABAPENTIN 100 MG PO CAPS
ORAL_CAPSULE | ORAL | 3 refills | Status: DC
Start: 2020-07-30 — End: 2020-12-23

## 2020-07-30 MED ORDER — NABUMETONE 500 MG PO TABS: 500.0000 mg | ORAL_TABLET | Freq: Two times a day (BID) | ORAL | 3 refills | Status: DC | PRN

## 2020-07-30 NOTE — Progress Notes (Signed)
Office Visit Note   Patient: Veronica Baker           Date of Birth: 11-Dec-1950           MRN: [id number] Visit Date: 07/30/2020 Requested by: Stevphen Rochester, MD [address] [place],  [place] [address] PCP: Stevphen Rochester, MD  Subjective: Chief Complaint  Patient presents with  . Left Hip - Pain    Pain in the hip down to the knee. Was seen by her PCP and received a cortisone injection in the left trochanter - helped x 2-3 days. Xrays were taken ([facility]). Ambulating with walker today. Uses either this or cane usually.    HPI: She is here with left hip pain. Symptoms started a couple weeks ago, no injury. Pain on the posterior lateral aspect with radiation down the leg to below the knee when standing to walk. She feels fine when sitting. She went to her PCP who took x-rays of the hip which showed osteoarthritis. I am unable to see those images, but am able to see the radiology report. She had a greater trochanter injection which gave her 2 or 3 days of improvement, now the pain is back like it was before. Denies any groin pain.  When I saw her last spring, she was diagnosed with lumbar spinal stenosis per MRI scan. She has been doing pretty well on medication. She did physical therapy as well.                ROS:   All other systems were reviewed and are negative.  Objective: Vital Signs: There were no vitals taken for this visit.  Physical Exam:  General:  Alert and oriented, in no acute distress. Pulm:  Breathing unlabored. Psy:  Normal mood, congruent affect. Skin: No rash Left hip: She has limited internal rotation motion but this does not cause significant pain. She does have some tenderness over the greater trochanter today. Straight leg raise negative, lower extremity strength and reflexes are still normal.  Imaging: No results found.  Assessment & Plan: 1. Persistent left hip pain, suspect due to lumbar stenosis -Elected to refer her to Dr.  Alvester Morin for epidural injection. If this does not help, then we will try a diagnostic intra-articular hip injection. Refill of her medications.     Procedures: No procedures performed  No notes on file     PMFS History: Patient Active Problem List   Diagnosis Date Noted  . Age-related incipient cataract of both eyes 01/15/2020  . History of colonic polyps 01/15/2020  . Primary open angle glaucoma (POAG) 01/15/2020  . Snoring 01/15/2020  . Hypertensive disorder 10/04/2019  . Chronic heart failure (HCC) 05/11/2019  . Chronic low back pain without sciatica 02/22/2019  . Hypertensive urgency 02/22/2019  . Schizoaffective disorder (HCC) 08/28/2018  . Referred ear pain, left 08/15/2018  . Tongue lesion 08/15/2018  . Hypercholesterolemia 05/09/2018  . Moderate persistent asthma without complication 05/27/2015  . Obesity (BMI 30-39.9) 05/27/2015  . Bipolar affective disorder, currently active (HCC) 05/09/2015  . Chronic back pain 05/09/2015  . History of sarcoidosis 05/09/2015  . Abnormal cardiovascular stress test 09/13/2012  . Diastolic dysfunction 09/13/2012  . SOB (shortness of breath) 09/06/2012   Past Medical History:  Diagnosis Date  . Anemia   . Arthritis   . Asthma   . Blood dyscrasia    polyclonal gamopathy  . Depression    Bipolar  . Fibromyalgia   . Headache   .  Hyperlipidemia   . Hypertension   . Sarcoidosis   . Sleep apnea     Family History  Problem Relation Age of Onset  . Hypertension Brother   . Coronary artery disease Brother   . Asthma Brother   . Alzheimer's disease Mother   . Healthy Son   . Healthy Son     Past Surgical History:  Procedure Laterality Date  . ABDOMINAL HYSTERECTOMY    . APPENDECTOMY    . ARTERY BIOPSY Right 06/16/2017   Procedure: BIOPSY TEMPORAL ARTERY RIGHT;  Surgeon: Larina Earthly, MD;  Location: [facility];  Service: Vascular;  Laterality: Right;  . bilateral oophorectomy    . BREAST SURGERY    . CARDIAC CATHETERIZATION     . COLON SURGERY     colonscopy  . HERNIA REPAIR    . TUBAL LIGATION    . UMBILICAL HERNIA REPAIR     Social History   Occupational History  . Not on file  Tobacco Use  . Smoking status: Never Smoker  . Smokeless tobacco: Never Used  Vaping Use  . Vaping Use: Never used  Substance and Sexual Activity  . Alcohol use: No  . Drug use: No  . Sexual activity: Not on file

## 2020-08-07 ENCOUNTER — Telehealth: Payer: Medicare HMO | Admitting: Psychiatry

## 2020-09-05 ENCOUNTER — Telehealth: Payer: Self-pay

## 2020-09-05 NOTE — Telephone Encounter (Signed)
Pt has requested Valium for her appt on 12/6. 

## 2020-09-13 ENCOUNTER — Other Ambulatory Visit: Payer: Self-pay | Admitting: Physical Medicine and Rehabilitation

## 2020-09-13 DIAGNOSIS — F411 Generalized anxiety disorder: Secondary | ICD-10-CM

## 2020-09-13 MED ORDER — DIAZEPAM 5 MG PO TABS
ORAL_TABLET | ORAL | 0 refills | Status: DC
Start: 2020-09-13 — End: 2020-10-22

## 2020-09-13 NOTE — Progress Notes (Signed)
Pre-procedure diazepam ordered for pre-operative anxiety.  

## 2020-09-13 NOTE — Telephone Encounter (Signed)
done

## 2020-09-25 ENCOUNTER — Ambulatory Visit (INDEPENDENT_AMBULATORY_CARE_PROVIDER_SITE_OTHER): Payer: Medicare Other | Admitting: Physical Medicine and Rehabilitation

## 2020-09-25 ENCOUNTER — Encounter: Payer: Self-pay | Admitting: Physical Medicine and Rehabilitation

## 2020-09-25 ENCOUNTER — Ambulatory Visit: Payer: Self-pay

## 2020-09-25 ENCOUNTER — Other Ambulatory Visit: Payer: Self-pay

## 2020-09-25 VITALS — BP 124/80 | HR 111

## 2020-09-25 DIAGNOSIS — M5416 Radiculopathy, lumbar region: Secondary | ICD-10-CM | POA: Diagnosis not present

## 2020-09-25 MED ORDER — METHYLPREDNISOLONE ACETATE 80 MG/ML IJ SUSP
80.0000 mg | Freq: Once | INTRAMUSCULAR | Status: AC
  Administered 2020-09-25: 80 mg

## 2020-09-25 NOTE — Progress Notes (Signed)
Veronica Baker - 70 y.o. female MRN [id number]  Date of birth: December 08, 1950  Office Visit Note: Visit Date: 09/25/2020 PCP: Stevphen Rochester, MD Referred by: Stevphen Rochester, MD  Subjective: Chief Complaint  Patient presents with  . Lower Back - Pain  . Left Leg - Pain  . Right Leg - Pain   HPI:  Veronica Baker is a 70 y.o. female who comes in today at the request of Dr. Lavada Mesi for planned Left L4-L5 Lumbar epidural steroid injection with fluoroscopic guidance.  The patient has failed conservative care including home exercise, medications, time and activity modification.  This injection will be diagnostic and hopefully therapeutic.  Please see requesting physician notes for further details and justification.   ROS Otherwise per HPI.  Assessment & Plan: Visit Diagnoses:    ICD-10-CM   1. Lumbar radiculopathy  M54.16 XR C-ARM NO REPORT    Epidural Steroid injection    methylPREDNISolone acetate (DEPO-MEDROL) injection 80 mg    Plan: No additional findings.   Meds & Orders:  Meds ordered this encounter  Medications  . methylPREDNISolone acetate (DEPO-MEDROL) injection 80 mg    Orders Placed This Encounter  Procedures  . XR C-ARM NO REPORT  . Epidural Steroid injection    Follow-up: Return if symptoms worsen or fail to improve.   Procedures: No procedures performed  Lumbosacral Transforaminal Epidural Steroid Injection - Sub-Pedicular Approach with Fluoroscopic Guidance  Patient: Veronica Baker      Date of Birth: 09/28/1950 MRN: [id number] PCP: Stevphen Rochester, MD      Visit Date: 09/25/2020   Universal Protocol:    Date/Time: 09/25/2020  Consent Given By: the patient  Position: PRONE  Additional Comments: Vital signs were monitored before and after the procedure. Patient was prepped and draped in the usual sterile fashion. The correct patient, procedure, and site was verified.   Injection Procedure Details:   Procedure diagnoses:  Lumbar radiculopathy [M54.16]    Meds Administered:  Meds ordered this encounter  Medications  . methylPREDNISolone acetate (DEPO-MEDROL) injection 80 mg    Laterality: Left  Location/Site:  L4-L5  Needle:5.0 in., 22 ga.  Short bevel or Quincke spinal needle  Needle Placement: Transforaminal  Findings:    -Comments: Excellent flow of contrast along the nerve, nerve root and into the epidural space.  Procedure Details: After squaring off the end-plates to get a true AP view, the C-arm was positioned so that an oblique view of the foramen as noted above was visualized. The target area is just inferior to the "nose of the scotty dog" or sub pedicular. The soft tissues overlying this structure were infiltrated with 2-3 ml. of 1% Lidocaine without Epinephrine.  The spinal needle was inserted toward the target using a "trajectory" view along the fluoroscope beam.  Under AP and lateral visualization, the needle was advanced so it did not puncture dura and was located close the 6 O'Clock position of the pedical in AP tracterory. Biplanar projections were used to confirm position. Aspiration was confirmed to be negative for CSF and/or blood. A 1-2 ml. volume of Isovue-250 was injected and flow of contrast was noted at each level. Radiographs were obtained for documentation purposes.   After attaining the desired flow of contrast documented above, a 0.5 to 1.0 ml test dose of 0.25% Marcaine was injected into each respective transforaminal space.  The patient was observed for 90 seconds post injection.  After no sensory deficits were reported, and normal lower  extremity motor function was noted,   the above injectate was administered so that equal amounts of the injectate were placed at each foramen (level) into the transforaminal epidural space.   Additional Comments:  The patient tolerated the procedure well Dressing: 2 x 2 sterile gauze and Band-Aid    Post-procedure details: Patient was  observed during the procedure. Post-procedure instructions were reviewed.  Patient left the clinic in stable condition.      Clinical History: MRI LUMBAR SPINE WITHOUT CONTRAST  TECHNIQUE: Multiplanar, multisequence MR imaging of the lumbar spine was performed. No intravenous contrast was administered.  COMPARISON:  None.  FINDINGS: Segmentation:  Standard.  Alignment:  Anteroposterior alignment is maintained.  Vertebrae: Vertebral body heights preserved apart mild degenerative endplate irregularity. There is no suspicious osseous lesion. Trace degenerative endplate marrow edema at L4-L5. There is also likely degenerative marrow edema along the left L4 pedicle in proximity to facet arthropathy.  Conus medullaris and cauda equina: Conus extends to the L2 level. Conus and cauda equina appear normal.  Paraspinal and other soft tissues: Unremarkable.  Disc levels: Disc desiccation and height loss L3-L4 through L5-S1. Congenital narrowing of the spinal canal. Imaged in the sagittal plane only, there is a disc bulge and central disc protrusion at T11-T12 contacting the ventral cord.  L1-L2: Minimal disc bulge. Mild facet arthropathy. No significant canal or foraminal stenosis.  L2-L3: Minimal disc bulge. Mild facet arthropathy. No significant canal right foraminal stenosis. Minor left foraminal stenosis.  L3-L4: Disc bulge with central disc extrusion extending slightly above and below the disc level. Mild facet arthropathy with ligamentum flavum infolding. Moderate to marked canal stenosis with narrowing of the lateral recesses. Moderate foraminal stenosis.  L4-L5: Disc bulge with endplate osteophytic ridging slightly eccentric to the left. Moderate facet arthropathy with ligamentum flavum infolding. Moderate to marked canal stenosis with narrowing of the left greater than right lateral recesses. Moderate foraminal stenosis.  L5-S1: Disc bulge with  endplate osteophytic ridging. Mild facet arthropathy ligamentum flavum infolding. No significant canal stenosis. Moderate foraminal stenosis.  IMPRESSION: Multilevel degenerative changes as detailed above. Canal and lateral recess stenosis greatest at L3-L4 and L4-L5. Foraminal stenosis is present from L3-L4 to L5-S1. Facet arthropathy greatest at L4-L5.  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   Electronically Signed   By: Guadlupe Spanish M.D.   On: 12/27/2019 13:53     Objective:  VS:  HT:    WT:   BMI:     BP:124/80  HR:(!) 111bpm  TEMP: ( )  RESP:  Physical Exam Vitals and nursing note reviewed.  Constitutional:      General: She is not in acute distress.    Appearance: Normal appearance. She is not ill-appearing.  HENT:     Head: Normocephalic and atraumatic.     Right Ear: External ear normal.     Left Ear: External ear normal.  Eyes:     Extraocular Movements: Extraocular movements intact.  Cardiovascular:     Rate and Rhythm: Normal rate.     Pulses: Normal pulses.  Pulmonary:     Effort: Pulmonary effort is normal. No respiratory distress.  Abdominal:     General: There is no distension.     Palpations: Abdomen is soft.  Musculoskeletal:        General: Tenderness present.     Cervical back: Neck supple.     Right lower leg: No edema.     Left lower leg: No edema.  Comments: Patient has good distal strength with no pain over the greater trochanters.  No clonus or focal weakness.  Skin:    Findings: No erythema, lesion or rash.  Neurological:     General: No focal deficit present.     Mental Status: She is alert and oriented to person, place, and time.     Sensory: No sensory deficit.     Motor: No weakness or abnormal muscle tone.     Coordination: Coordination normal.  Psychiatric:        Mood and Affect: Mood normal.        Behavior: Behavior normal.      Imaging: No results found.

## 2020-09-25 NOTE — Progress Notes (Signed)
Pt state lower back pain that travel down both legs, mostly her left. Pt state sitting makes the pain worse. Pt state she use pain creams and over the counter meds to help ease the pain.  Numeric Pain Rating Scale and Functional Assessment Average Pain 9   In the last MONTH (on 0-10 scale) has pain interfered with the following?  1. General activity like being  able to carry out your everyday physical activities such as walking, climbing stairs, carrying groceries, or moving a chair?  Rating(8)   +Driver, -BT, -Dye Allergies.

## 2020-09-25 NOTE — Patient Instructions (Signed)

## 2020-10-01 ENCOUNTER — Encounter: Payer: Self-pay | Admitting: Psychiatry

## 2020-10-01 ENCOUNTER — Telehealth (INDEPENDENT_AMBULATORY_CARE_PROVIDER_SITE_OTHER): Payer: Medicare Other | Admitting: Psychiatry

## 2020-10-01 DIAGNOSIS — F411 Generalized anxiety disorder: Secondary | ICD-10-CM | POA: Diagnosis not present

## 2020-10-01 DIAGNOSIS — F5105 Insomnia due to other mental disorder: Secondary | ICD-10-CM | POA: Diagnosis not present

## 2020-10-01 DIAGNOSIS — F251 Schizoaffective disorder, depressive type: Secondary | ICD-10-CM | POA: Diagnosis not present

## 2020-10-01 DIAGNOSIS — G4721 Circadian rhythm sleep disorder, delayed sleep phase type: Secondary | ICD-10-CM

## 2020-10-01 MED ORDER — QUETIAPINE FUMARATE 300 MG PO TABS: 300.0000 mg | ORAL_TABLET | Freq: Every day | ORAL | 1 refills | Status: DC

## 2020-10-01 MED ORDER — CITALOPRAM HYDROBROMIDE 20 MG PO TABS: 20.0000 mg | ORAL_TABLET | Freq: Every day | ORAL | 1 refills | Status: DC
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
## 2020-10-17 ENCOUNTER — Telehealth: Payer: Self-pay | Admitting: Physical Medicine and Rehabilitation

## 2020-10-17 NOTE — Telephone Encounter (Signed)
Is auth needed? Patient has not been scheduled. 

## 2020-10-17 NOTE — Telephone Encounter (Signed)
Left l4-5 interlam

## 2020-10-17 NOTE — Telephone Encounter (Signed)
L5-S1

## 2020-10-17 NOTE — Telephone Encounter (Signed)
Patient called requesting a call back to set appt for follow up. Patient states she is still having pain after surgery. Patient phone number is [phone].

## 2020-10-17 NOTE — Telephone Encounter (Signed)
Patient reports about 10% relief for a short time following her left L4 TF on 1/26. Please advise.

## 2020-10-21 NOTE — Telephone Encounter (Signed)
Pt not req Auth#. 

## 2020-10-21 NOTE — Telephone Encounter (Signed)
Called pt and sch 2/23

## 2020-10-22 ENCOUNTER — Other Ambulatory Visit: Payer: Self-pay | Admitting: Physical Medicine and Rehabilitation

## 2020-10-22 ENCOUNTER — Telehealth: Payer: Self-pay | Admitting: Physical Medicine and Rehabilitation

## 2020-10-22 DIAGNOSIS — F411 Generalized anxiety disorder: Secondary | ICD-10-CM

## 2020-10-22 MED ORDER — DIAZEPAM 5 MG PO TABS: ORAL_TABLET | ORAL | 0 refills | Status: DC

## 2020-10-22 NOTE — Progress Notes (Signed)
Pre-procedure diazepam ordered for pre-operative anxiety.  

## 2020-10-22 NOTE — Telephone Encounter (Signed)
Patient is requesting Valium prior to her [facility] tomorrow. Pharmacy is correct.

## 2020-10-22 NOTE — Telephone Encounter (Signed)
done

## 2020-10-22 NOTE — Telephone Encounter (Signed)
Pt called wanting to know if Dr.Newton would be sending anything in to help her keep calm during the procedure on 10/23/20? Pt would like a CB if Dr.Newton will send something in

## 2020-10-23 ENCOUNTER — Other Ambulatory Visit: Payer: Self-pay

## 2020-10-23 ENCOUNTER — Ambulatory Visit: Payer: Self-pay

## 2020-10-23 ENCOUNTER — Ambulatory Visit (INDEPENDENT_AMBULATORY_CARE_PROVIDER_SITE_OTHER): Payer: Medicare Other | Admitting: Physical Medicine and Rehabilitation

## 2020-10-23 ENCOUNTER — Encounter: Payer: Self-pay | Admitting: Physical Medicine and Rehabilitation

## 2020-10-23 VITALS — BP 143/83 | HR 103

## 2020-10-23 DIAGNOSIS — M5416 Radiculopathy, lumbar region: Secondary | ICD-10-CM

## 2020-10-23 MED ORDER — BETAMETHASONE SOD PHOS & ACET 6 (3-3) MG/ML IJ SUSP
12.0000 mg | Freq: Once | INTRAMUSCULAR | Status: AC
Start: 2020-10-23 — End: 2020-10-23
  Administered 2020-10-23: 12 mg

## 2020-10-23 NOTE — Procedures (Signed)
Lumbar Epidural Steroid Injection - Interlaminar Approach with Fluoroscopic Guidance  Patient: Veronica Baker      Date of Birth: [birth year] MRN: [id number] PCP: Stevphen Rochester, MD      Visit Date: 10/23/2020   Universal Protocol:     Consent Given By: the patient  Position: PRONE  Additional Comments: Vital signs were monitored before and after the procedure. Patient was prepped and draped in the usual sterile fashion. The correct patient, procedure, and site was verified.   Injection Procedure Details:   Procedure diagnoses: Lumbar radiculopathy [M54.16]   Meds Administered:  Meds ordered this encounter  Medications  . betamethasone acetate-betamethasone sodium phosphate (CELESTONE) injection 12 mg     Laterality: Left  Location/Site:  L5-S1  Needle: 3.5 in., 20 ga. Tuohy  Needle Placement: Paramedian epidural  Findings:   -Comments: Excellent flow of contrast into the epidural space.  Procedure Details: Using a paramedian approach from the side mentioned above, the region overlying the inferior lamina was localized under fluoroscopic visualization and the soft tissues overlying this structure were infiltrated with 4 ml. of 1% Lidocaine without Epinephrine. The Tuohy needle was inserted into the epidural space using a paramedian approach.   The epidural space was localized using loss of resistance along with counter oblique bi-planar fluoroscopic views.  After negative aspirate for air, blood, and CSF, a 2 ml. volume of Isovue-250 was injected into the epidural space and the flow of contrast was observed. Radiographs were obtained for documentation purposes.    The injectate was administered into the level noted above.   Additional Comments:  The patient tolerated the procedure well Dressing: 2 x 2 sterile gauze and Band-Aid    Post-procedure details: Patient was observed during the procedure. Post-procedure instructions were reviewed.  Patient left the  clinic in stable condition.

## 2020-10-23 NOTE — Progress Notes (Signed)
Veronica Baker - 70 y.o. female MRN [id number]  Date of birth: May 23, 1951  Office Visit Note: Visit Date: 10/23/2020 PCP: Stevphen Rochester, MD Referred by: Stevphen Rochester, MD  Subjective: Chief Complaint  Patient presents with  . Left Leg - Pain   HPI:  Veronica Baker is a 70 y.o. female who comes in today at the request of Dr. Lavada Mesi for planned Left L5-S1 Lumbar epidural steroid injection with fluoroscopic guidance.  The patient has failed conservative care including home exercise, medications, time and activity modification.  This injection will be diagnostic and hopefully therapeutic.  Please see requesting physician notes for further details and justification.   ROS Otherwise per HPI.  Assessment & Plan: Visit Diagnoses:    ICD-10-CM   1. Lumbar radiculopathy  M54.16 XR C-ARM NO REPORT    Epidural Steroid injection    betamethasone acetate-betamethasone sodium phosphate (CELESTONE) injection 12 mg    Plan: No additional findings.   Meds & Orders:  Meds ordered this encounter  Medications  . betamethasone acetate-betamethasone sodium phosphate (CELESTONE) injection 12 mg    Orders Placed This Encounter  Procedures  . XR C-ARM NO REPORT  . Epidural Steroid injection    Follow-up: Return for visit to requesting physician as needed.   Procedures: No procedures performed  Lumbar Epidural Steroid Injection - Interlaminar Approach with Fluoroscopic Guidance  Patient: Veronica Baker      Date of Birth: [birth year] MRN: [id number] PCP: Stevphen Rochester, MD      Visit Date: 10/23/2020   Universal Protocol:     Consent Given By: the patient  Position: PRONE  Additional Comments: Vital signs were monitored before and after the procedure. Patient was prepped and draped in the usual sterile fashion. The correct patient, procedure, and site was verified.   Injection Procedure Details:   Procedure diagnoses: Lumbar radiculopathy [M54.16]    Meds Administered:  Meds ordered this encounter  Medications  . betamethasone acetate-betamethasone sodium phosphate (CELESTONE) injection 12 mg     Laterality: Left  Location/Site:  L5-S1  Needle: 3.5 in., 20 ga. Tuohy  Needle Placement: Paramedian epidural  Findings:   -Comments: Excellent flow of contrast into the epidural space.  Procedure Details: Using a paramedian approach from the side mentioned above, the region overlying the inferior lamina was localized under fluoroscopic visualization and the soft tissues overlying this structure were infiltrated with 4 ml. of 1% Lidocaine without Epinephrine. The Tuohy needle was inserted into the epidural space using a paramedian approach.   The epidural space was localized using loss of resistance along with counter oblique bi-planar fluoroscopic views.  After negative aspirate for air, blood, and CSF, a 2 ml. volume of Isovue-250 was injected into the epidural space and the flow of contrast was observed. Radiographs were obtained for documentation purposes.    The injectate was administered into the level noted above.   Additional Comments:  The patient tolerated the procedure well Dressing: 2 x 2 sterile gauze and Band-Aid    Post-procedure details: Patient was observed during the procedure. Post-procedure instructions were reviewed.  Patient left the clinic in stable condition.     Clinical History: MRI LUMBAR SPINE WITHOUT CONTRAST  TECHNIQUE: Multiplanar, multisequence MR imaging of the lumbar spine was performed. No intravenous contrast was administered.  COMPARISON:  None.  FINDINGS: Segmentation:  Standard.  Alignment:  Anteroposterior alignment is maintained.  Vertebrae: Vertebral body heights preserved apart mild degenerative endplate irregularity. There is no suspicious  osseous lesion. Trace degenerative endplate marrow edema at L4-L5. There is also likely degenerative marrow edema along the  left L4 pedicle in proximity to facet arthropathy.  Conus medullaris and cauda equina: Conus extends to the L2 level. Conus and cauda equina appear normal.  Paraspinal and other soft tissues: Unremarkable.  Disc levels: Disc desiccation and height loss L3-L4 through L5-S1. Congenital narrowing of the spinal canal. Imaged in the sagittal plane only, there is a disc bulge and central disc protrusion at T11-T12 contacting the ventral cord.  L1-L2: Minimal disc bulge. Mild facet arthropathy. No significant canal or foraminal stenosis.  L2-L3: Minimal disc bulge. Mild facet arthropathy. No significant canal right foraminal stenosis. Minor left foraminal stenosis.  L3-L4: Disc bulge with central disc extrusion extending slightly above and below the disc level. Mild facet arthropathy with ligamentum flavum infolding. Moderate to marked canal stenosis with narrowing of the lateral recesses. Moderate foraminal stenosis.  L4-L5: Disc bulge with endplate osteophytic ridging slightly eccentric to the left. Moderate facet arthropathy with ligamentum flavum infolding. Moderate to marked canal stenosis with narrowing of the left greater than right lateral recesses. Moderate foraminal stenosis.  L5-S1: Disc bulge with endplate osteophytic ridging. Mild facet arthropathy ligamentum flavum infolding. No significant canal stenosis. Moderate foraminal stenosis.  IMPRESSION: Multilevel degenerative changes as detailed above. Canal and lateral recess stenosis greatest at L3-L4 and L4-L5. Foraminal stenosis is present from L3-L4 to L5-S1. Facet arthropathy greatest at L4-L5.  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   Electronically Signed   By: Guadlupe Spanish M.D.   On: 12/27/2019 13:53     Objective:  VS:  HT:    WT:   BMI:     BP:(!) 143/83  HR:(!) 103bpm  TEMP: ( )  RESP:  Physical Exam Vitals and nursing note reviewed.   Constitutional:      General: She is not in acute distress.    Appearance: Normal appearance. She is obese. She is not ill-appearing.  HENT:     Head: Normocephalic and atraumatic.     Right Ear: External ear normal.     Left Ear: External ear normal.  Eyes:     Extraocular Movements: Extraocular movements intact.  Cardiovascular:     Rate and Rhythm: Normal rate.     Pulses: Normal pulses.  Pulmonary:     Effort: Pulmonary effort is normal. No respiratory distress.  Abdominal:     General: There is no distension.     Palpations: Abdomen is soft.  Musculoskeletal:        General: Tenderness present.     Cervical back: Neck supple.     Right lower leg: No edema.     Left lower leg: No edema.     Comments: Patient has good distal strength with no pain over the greater trochanters.  No clonus or focal weakness.  Skin:    Findings: No erythema, lesion or rash.  Neurological:     General: No focal deficit present.     Mental Status: She is alert and oriented to person, place, and time.     Sensory: No sensory deficit.     Motor: No weakness or abnormal muscle tone.     Coordination: Coordination normal.  Psychiatric:        Mood and Affect: Mood normal.        Behavior: Behavior normal.      Imaging: No results found.

## 2020-10-23 NOTE — Progress Notes (Signed)
Pt state left thigh and leg pain. Pt state walking makes the pain worse. Pt state she take pain meds to help ease her pain., Pt has hx of inj on 09/25/20 pt state it help for a little while.  Numeric Pain Rating Scale and Functional Assessment Average Pain 10   In the last MONTH (on 0-10 scale) has pain interfered with the following?  1. General activity like being  able to carry out your everyday physical activities such as walking, climbing stairs, carrying groceries, or moving a chair?  Rating(10)   +Driver, -BT, -Dye Allergies.

## 2020-10-23 NOTE — Patient Instructions (Signed)

## 2020-11-06 NOTE — Procedures (Signed)
Lumbosacral Transforaminal Epidural Steroid Injection - Sub-Pedicular Approach with Fluoroscopic Guidance  Patient: Veronica Baker      Date of Birth: 10-29-1950 MRN: [id number] PCP: Stevphen Rochester, MD      Visit Date: 09/25/2020   Universal Protocol:    Date/Time: 09/25/2020  Consent Given By: the patient  Position: PRONE  Additional Comments: Vital signs were monitored before and after the procedure. Patient was prepped and draped in the usual sterile fashion. The correct patient, procedure, and site was verified.   Injection Procedure Details:   Procedure diagnoses: Lumbar radiculopathy [M54.16]    Meds Administered:  Meds ordered this encounter  Medications  . methylPREDNISolone acetate (DEPO-MEDROL) injection 80 mg    Laterality: Left  Location/Site:  L4-L5  Needle:5.0 in., 22 ga.  Short bevel or Quincke spinal needle  Needle Placement: Transforaminal  Findings:    -Comments: Excellent flow of contrast along the nerve, nerve root and into the epidural space.  Procedure Details: After squaring off the end-plates to get a true AP view, the C-arm was positioned so that an oblique view of the foramen as noted above was visualized. The target area is just inferior to the "nose of the scotty dog" or sub pedicular. The soft tissues overlying this structure were infiltrated with 2-3 ml. of 1% Lidocaine without Epinephrine.  The spinal needle was inserted toward the target using a "trajectory" view along the fluoroscope beam.  Under AP and lateral visualization, the needle was advanced so it did not puncture dura and was located close the 6 O'Clock position of the pedical in AP tracterory. Biplanar projections were used to confirm position. Aspiration was confirmed to be negative for CSF and/or blood. A 1-2 ml. volume of Isovue-250 was injected and flow of contrast was noted at each level. Radiographs were obtained for documentation purposes.   After attaining the  desired flow of contrast documented above, a 0.5 to 1.0 ml test dose of 0.25% Marcaine was injected into each respective transforaminal space.  The patient was observed for 90 seconds post injection.  After no sensory deficits were reported, and normal lower extremity motor function was noted,   the above injectate was administered so that equal amounts of the injectate were placed at each foramen (level) into the transforaminal epidural space.   Additional Comments:  The patient tolerated the procedure well Dressing: 2 x 2 sterile gauze and Band-Aid    Post-procedure details: Patient was observed during the procedure. Post-procedure instructions were reviewed.  Patient left the clinic in stable condition.

## 2020-11-18 ENCOUNTER — Telehealth: Payer: Self-pay | Admitting: Psychiatry

## 2020-11-18 ENCOUNTER — Other Ambulatory Visit: Payer: Self-pay | Admitting: Psychiatry

## 2020-11-18 MED ORDER — QUETIAPINE FUMARATE ER 200 MG PO TB24
ORAL_TABLET | ORAL | 1 refills | Status: DC
Start: 2020-11-18 — End: 2020-12-12

## 2020-11-18 NOTE — Telephone Encounter (Signed)
Veronica Baker called in today concerning her Quetiapine 300mg . She states she takes it before she goes to sleep at night, but she states that is makes her to sleepy. She goes to the bathroom in the middle of the night and falls asleep while using the bathroom. She also states the other she fell and hit her head on the dresser. She would like to be taken off the medication. Ph [phone]

## 2020-11-18 NOTE — Telephone Encounter (Signed)
She cannot stop quetiapine immediately for a variety of reasons including she will not sleep.  I sent in a prescription for a slow release form at 200 mg which is a reduction in the dose.  This medicine will kick in more slowly than regular quetiapine and will not make her as drowsy at night.  There is a risk that she could have more anxiety or mood problems and if she does please let us know.  When I see here at the appointment will decide about the next step.  I sent in the prescription to the local pharmacy.  She needs to move her appointment up with me and see me within a couple of months.

## 2020-11-18 NOTE — Telephone Encounter (Signed)
Reviewed

## 2020-11-18 NOTE — Telephone Encounter (Signed)
Please review

## 2020-11-18 NOTE — Telephone Encounter (Signed)
Pt has been informed.

## 2020-12-11 ENCOUNTER — Other Ambulatory Visit: Payer: Self-pay | Admitting: Psychiatry

## 2020-12-23 ENCOUNTER — Other Ambulatory Visit: Payer: Self-pay | Admitting: Psychiatry

## 2020-12-23 ENCOUNTER — Other Ambulatory Visit: Payer: Self-pay | Admitting: Family Medicine

## 2020-12-28 ENCOUNTER — Emergency Department (HOSPITAL_COMMUNITY): Payer: Medicare Other

## 2020-12-28 ENCOUNTER — Encounter (HOSPITAL_COMMUNITY): Payer: Self-pay

## 2020-12-28 ENCOUNTER — Emergency Department (HOSPITAL_COMMUNITY)
Admission: EM | Admit: 2020-12-28 | Discharge: 2020-12-28 | Disposition: A | Discharge status: Home / Self Care | Payer: Medicare Other | Attending: Emergency Medicine | Admitting: Emergency Medicine

## 2020-12-28 ENCOUNTER — Other Ambulatory Visit: Payer: Self-pay

## 2020-12-28 DIAGNOSIS — M79652 Pain in left thigh: Secondary | ICD-10-CM | POA: Diagnosis not present

## 2020-12-28 DIAGNOSIS — W06XXXA Fall from bed, initial encounter: Secondary | ICD-10-CM | POA: Diagnosis not present

## 2020-12-28 DIAGNOSIS — I5032 Chronic diastolic (congestive) heart failure: Secondary | ICD-10-CM | POA: Insufficient documentation

## 2020-12-28 DIAGNOSIS — M25552 Pain in left hip: Secondary | ICD-10-CM | POA: Diagnosis present

## 2020-12-28 DIAGNOSIS — M5136 Other intervertebral disc degeneration, lumbar region: Secondary | ICD-10-CM

## 2020-12-28 DIAGNOSIS — Z79899 Other long term (current) drug therapy: Secondary | ICD-10-CM | POA: Diagnosis not present

## 2020-12-28 DIAGNOSIS — I110 Hypertensive heart disease with heart failure: Secondary | ICD-10-CM | POA: Diagnosis not present

## 2020-12-28 DIAGNOSIS — J4540 Moderate persistent asthma, uncomplicated: Secondary | ICD-10-CM | POA: Diagnosis not present

## 2020-12-28 DIAGNOSIS — Z7951 Long term (current) use of inhaled steroids: Secondary | ICD-10-CM | POA: Insufficient documentation

## 2020-12-28 DIAGNOSIS — M79662 Pain in left lower leg: Secondary | ICD-10-CM | POA: Insufficient documentation

## 2020-12-28 DIAGNOSIS — M1612 Unilateral primary osteoarthritis, left hip: Secondary | ICD-10-CM

## 2020-12-28 DIAGNOSIS — R52 Pain, unspecified: Secondary | ICD-10-CM

## 2020-12-28 MED ORDER — OXYCODONE-ACETAMINOPHEN 5-325 MG PO TABS: 1.0000 | ORAL_TABLET | Freq: Four times a day (QID) | ORAL | 0 refills | Status: DC | PRN

## 2020-12-28 MED ORDER — HYDROMORPHONE HCL 1 MG/ML IJ SOLN
1.0000 mg | Freq: Once | INTRAMUSCULAR | Status: AC
  Administered 2020-12-28: 1 mg via INTRAMUSCULAR
  Filled 2020-12-28: qty 1

## 2020-12-28 MED ORDER — ONDANSETRON 8 MG PO TBDP: 8.0000 mg | ORAL_TABLET | Freq: Three times a day (TID) | ORAL | 0 refills | Status: DC | PRN

## 2020-12-28 MED ORDER — PREDNISONE 20 MG PO TABS: ORAL_TABLET | ORAL | 0 refills | Status: DC

## 2020-12-28 MED ORDER — PREDNISONE 20 MG PO TABS
60.0000 mg | ORAL_TABLET | Freq: Once | ORAL | Status: AC
  Administered 2020-12-28: 60 mg via ORAL
  Filled 2020-12-28: qty 3

## 2020-12-28 NOTE — ED Triage Notes (Signed)
Pt BIB [facility] from home c/o of left hip/left leg pain. Per pt she slipped off the end of the bed onto left hip/left leg. Pain 10/10 and has been bed bound since.

## 2020-12-28 NOTE — ED Notes (Signed)
RN ambulated pt w/ walker. Pt wears walker @ home. Tolerated well. Pt states "what was that medicine ya'll gave me earlier, I feel good." called son aware being discharged stated he was on the way. Medications follow up appts reviewed w/ pt. Medications sent to verified pharmacy, Aware has home health referral and will be contacted in next few days.  Denies questions or concerns @ this time. Education on s/s of worsening and when to return. Currently awaiting son to come pick her up.

## 2020-12-28 NOTE — Discharge Instructions (Addendum)
It was our pleasure to provide your ER care today - we hope that you feel better.  Take prednisone as prescribed. Take motrin or aleve as need for pain. You may also take percocet as need for pain. No driving for the next 6 hours or when taking percocet. Also, do not take tylenol or acetaminophen containing medication when taking percocet. You may also take zofran as need for nausea if pain meds make you feel nauseated.   Fall precautions - use great care and caution to avoid falling. Use walker for added stability.   Follow up with your primary care doctor and orthopedic/back/hip specialist in the coming week - call office to arrange appointment.   We also have made a home health referral  - they should be contacting you in the next couple days.   Return to ER if worse, new symptoms, fevers, severe or intractable pain, numbness/weakness, or other concern.

## 2020-12-28 NOTE — ED Notes (Signed)
Pt left in wheelchair w/ NAD noted. All belongings returned to pt. Picked up by son.

## 2020-12-28 NOTE — ED Provider Notes (Signed)
[facility] EMERGENCY DEPARTMENT Provider Note   CSN: [id number] Arrival date & time: 12/28/20  1456     History Chief Complaint  Patient presents with  . Leg Pain  . Hip Pain    Veronica Baker is a 70 y.o. female.  Patient s/p fall from bed. States two days ago was sitting on edge of bed, and bottom slid off edge of bed to floor onto left hip. C/o left pain and upper leg area pain since. Symptoms acute onset, moderate, constant, persistent, dull, non radiating. Denies head injury or loc. No faintness or dizziness prior to or since incident. No ddd and chronic low back pain. Denies any new numbness or weakness. No fever or chills. Denies other pain or injury.   The history is provided by the patient and the EMS personnel.  Leg Pain Associated symptoms: no fever and no neck pain   Hip Pain Pertinent negatives include no chest pain, no abdominal pain, no headaches and no shortness of breath.       Past Medical History:  Diagnosis Date  . Anemia   . Arthritis   . Asthma   . Blood dyscrasia    polyclonal gamopathy  . Depression    Bipolar  . Fibromyalgia   . Headache   . Hyperlipidemia   . Hypertension   . Sarcoidosis   . Sleep apnea     Patient Active Problem List   Diagnosis Date Noted  . Age-related incipient cataract of both eyes 01/15/2020  . History of colonic polyps 01/15/2020  . Primary open angle glaucoma (POAG) 01/15/2020  . Snoring 01/15/2020  . Hypertensive disorder 10/04/2019  . Chronic heart failure (HCC) 05/11/2019  . Chronic low back pain without sciatica 02/22/2019  . Hypertensive urgency 02/22/2019  . Schizoaffective disorder (HCC) 08/28/2018  . Referred ear pain, left 08/15/2018  . Tongue lesion 08/15/2018  . Hypercholesterolemia 05/09/2018  . Moderate persistent asthma without complication 05/27/2015  . Obesity (BMI 30-39.9) 05/27/2015  . Bipolar affective disorder, currently active (HCC) 05/09/2015  . Chronic back  pain 05/09/2015  . History of sarcoidosis 05/09/2015  . Abnormal cardiovascular stress test 09/13/2012  . Diastolic dysfunction 09/13/2012  . SOB (shortness of breath) 09/06/2012    Past Surgical History:  Procedure Laterality Date  . ABDOMINAL HYSTERECTOMY    . APPENDECTOMY    . ARTERY BIOPSY Right 06/16/2017   Procedure: BIOPSY TEMPORAL ARTERY RIGHT;  Surgeon: Larina Earthly, MD;  Location: [facility] OR;  Service: Vascular;  Laterality: Right;  . bilateral oophorectomy    . BREAST SURGERY    . CARDIAC CATHETERIZATION    . COLON SURGERY     colonscopy  . HERNIA REPAIR    . TUBAL LIGATION    . UMBILICAL HERNIA REPAIR       OB History   No obstetric history on file.     Family History  Problem Relation Age of Onset  . Hypertension Brother   . Coronary artery disease Brother   . Asthma Brother   . Alzheimer's disease Mother   . Healthy Son   . Healthy Son     Social History   Tobacco Use  . Smoking status: Never Smoker  . Smokeless tobacco: Never Used  Vaping Use  . Vaping Use: Never used  Substance Use Topics  . Alcohol use: No  . Drug use: No    Home Medications Prior to Admission medications   Medication Sig Start Date End Date Taking? Authorizing  Provider  acetaminophen (TYLENOL) 500 MG tablet Take 500 mg by mouth every 6 (six) hours as needed for mild pain.    [provider]  albuterol (2.5 MG/3ML) 0.083% NEBU 3 mL, albuterol (5 MG/ML) 0.5% NEBU 0.5 mL Inhale into the lungs.    [provider]  albuterol (PROVENTIL HFA;VENTOLIN HFA) 108 (90 Base) MCG/ACT inhaler Inhale 1-2 puffs into the lungs every 6 (six) hours as needed for wheezing or shortness of breath. Patient taking differently: Inhale 2 puffs into the lungs every 6 (six) hours as needed for wheezing or shortness of breath. 01/04/17   Alvina Chou, PA  amLODipine (NORVASC) 2.5 MG tablet Take 2.5 mg by mouth daily.    [provider]  atorvastatin (LIPITOR) 40 MG tablet  Take by mouth. 05/10/18   [provider]  baclofen (LIORESAL) 10 MG tablet Take 0.5-1 tablets (5-10 mg total) by mouth 3 (three) times daily as needed for muscle spasms. 11/22/19   Hilts, Casimiro Needle, MD  celecoxib (CELEBREX) 200 MG capsule Take 1 capsule (200 mg total) by mouth 2 (two) times daily as needed. 04/03/20   Hilts, Casimiro Needle, MD  chlorthalidone (HYGROTON) 25 MG tablet Take 25 mg by mouth daily.    [provider]  citalopram (CELEXA) 20 MG tablet Take 1 tablet (20 mg total) by mouth daily. 10/01/20   Cottle, Steva Ready., MD  [facility] SALINE NASAL SPRAY 0.65 % nasal spray SMARTSIG:1 Spray(s) Both Nares 3 Times Daily PRN 11/21/19   [provider]  cyclobenzaprine (FLEXERIL) 10 MG tablet Take 10 mg by mouth 3 (three) times daily as needed for muscle spasms.    [provider]  diazepam (VALIUM) 5 MG tablet Take 1 by mouth 1 hour  pre-procedure with very light food. May bring 2nd tablet to appointment. 10/22/20   Tyrell Antonio, MD  Ferrous Sulfate 27 MG TABS Take 27 mg by mouth daily.     [provider]  fluticasone (FLONASE) 50 MCG/ACT nasal spray Place 1 spray into both nostrils daily.    [provider]  Fluticasone-Salmeterol (ADVAIR) 250-50 MCG/DOSE AEPB Inhale 1 puff into the lungs 2 (two) times daily.    [provider]  furosemide (LASIX) 40 MG tablet Take 1 tablet (40 mg total) by mouth 2 (two) times daily. Patient taking differently: Take 20 mg by mouth daily. 09/13/12   Quintella Reichert, MD  gabapentin (NEURONTIN) 100 MG capsule TAKE 1 CAPSULE AT BEDTIME AND MAY INCREASE TO ONE 3 TIMES A DAY IF NEEDED 12/23/20   Hilts, Casimiro Needle, MD  HYDROcodone-acetaminophen (NORCO/VICODIN) 5-325 MG tablet Take by mouth. 12/12/19   [provider]  latanoprost (XALATAN) 0.005 % ophthalmic solution 1 drop at bedtime. 03/27/20   [provider]  meloxicam (MOBIC) 15 MG tablet Take 15 mg by mouth daily as needed for pain.    [provider]  Multiple Vitamin (MULTIVITAMIN) tablet Take 1 tablet by mouth daily.    [provider]  nabumetone (RELAFEN) 500 MG tablet Take 1 tablet (500 mg total) by mouth 2 (two) times daily as needed. 07/30/20   Hilts, Michael, MD  Potassium Gluconate 550 MG TABS Take 550 mg by mouth daily.    [provider]  QUEtiapine (SEROQUEL XR) 200 MG 24 hr tablet TAKE 2 HOURS BEFORE BEDTIME 12/12/20   Cottle, Steva Ready., MD    Allergies    Celebrex [celecoxib], Penicillins, and Tramadol  Review of Systems   Review of Systems  Constitutional: Negative for chills and fever.  HENT: Negative for sore throat.   Eyes: Negative for redness.  Respiratory: Negative for cough and shortness of breath.   Cardiovascular: Negative for chest pain.  Gastrointestinal: Negative for abdominal pain, diarrhea and vomiting.  Genitourinary: Negative for flank pain.  Musculoskeletal: Negative for neck pain.  Skin: Negative for wound.  Neurological: Negative for weakness, numbness and headaches.  Hematological: Does not bruise/bleed easily.  Psychiatric/Behavioral: Negative for confusion.    Physical Exam Updated Vital Signs SpO2 98%   Physical Exam Vitals and nursing note reviewed.  Constitutional:      Appearance: Normal appearance. She is well-developed.  HENT:     Head: Atraumatic.     Nose: Nose normal.     Mouth/Throat:     Mouth: Mucous membranes are moist.  Eyes:     General: No scleral icterus.    Conjunctiva/sclera: Conjunctivae normal.     Pupils: Pupils are equal, round, and reactive to light.  Neck:     Trachea: No tracheal deviation.  Cardiovascular:     Rate and Rhythm: Normal rate and regular rhythm.     Pulses: Normal pulses.     Heart sounds: Normal heart sounds. No murmur heard. No friction rub. No gallop.   Pulmonary:     Effort: Pulmonary effort is normal. No respiratory distress.     Breath sounds: Normal breath sounds.  Abdominal:     General:  Bowel sounds are normal. There is no distension.     Palpations: Abdomen is soft.     Tenderness: There is no abdominal tenderness.  Genitourinary:    Comments: No cva tenderness.  Musculoskeletal:        General: No swelling.     Cervical back: Normal range of motion and neck supple. No rigidity. No muscular tenderness.     Comments: CTLS spine, non tender, aligned, no step off. Tenderness left hip and upper leg. No significant sts noted, compartments of leg soft, not tense. Distal pulses palp bil. Otherwise good rom bil extremities without pain or other focal bony tenderness.   Skin:    General: Skin is warm and dry.     Findings: No rash.  Neurological:     Mental Status: She is alert.     Comments: Alert, speech normal. GCS 15. Motor intact bil, stre 5/5. Sens grossly intact.   Psychiatric:        Mood and Affect: Mood normal.     ED Results / Procedures / Treatments   Labs (all labs ordered are listed, but only abnormal results are displayed) Labs Reviewed - No data to display  EKG None  Radiology DG Pelvis 1-2 Views  Result Date: 12/28/2020 CLINICAL DATA:  Pain status post fall EXAM: LEFT FEMUR 2 VIEWS; PELVIS - 1-2 VIEW COMPARISON:  None. FINDINGS: There are end-stage degenerative changes of the left hip. There is no acute displaced fracture or dislocation. There is soft tissue swelling about the left lower extremity without evidence for an underlying radiopaque foreign body. There are moderate degenerative changes of the right hip and bilateral sacroiliac joints. IMPRESSION: 1. No acute osseous abnormality. 2. End-stage degenerative changes of the left hip. 3. Moderate degenerative changes of the right hip. 4. There are degenerative changes of the bilateral sacroiliac joints. 5. There is soft tissue swelling about the distal left thigh. Electronically Signed   By: Katherine Mantle M.D.   On: 12/28/2020 16:10   DG Tibia/Fibula Left  Result Date: 12/28/2020  CLINICAL DATA:   Fall, pain EXAM: LEFT TIBIA AND FIBULA - 2 VIEW COMPARISON:  None. FINDINGS: There is no evidence of fracture or other focal bone lesions. Soft tissue edema. IMPRESSION: No acute fracture. Electronically Signed   By: Guadlupe SpanishPraneil  Patel M.D.   On: 12/28/2020 20:13   DG Femur Min 2 Views Left  Result Date: 12/28/2020 CLINICAL DATA:  Pain status post fall EXAM: LEFT FEMUR 2 VIEWS; PELVIS - 1-2 VIEW COMPARISON:  None. FINDINGS: There are end-stage degenerative changes of the left hip. There is no acute displaced fracture or dislocation. There is soft tissue swelling about the left lower extremity without evidence for an underlying radiopaque foreign body. There are moderate degenerative changes of the right hip and bilateral sacroiliac joints. IMPRESSION: 1. No acute osseous abnormality. 2. End-stage degenerative changes of the left hip. 3. Moderate degenerative changes of the right hip. 4. There are degenerative changes of the bilateral sacroiliac joints. 5. There is soft tissue swelling about the distal left thigh. Electronically Signed   By: Katherine Mantlehristopher  Green M.D.   On: 12/28/2020 16:10    Procedures Procedures   Medications Ordered in ED Medications - No data to display  ED Course  I have reviewed the triage vital signs and the nursing notes.  Pertinent labs & imaging results that were available during my care of the patient were reviewed by me and considered in my medical decision making (see chart for details).    MDM Rules/Calculators/A&P                         Imaging ordered.   Reviewed nursing notes and prior charts for additional history. Hx ddd.   Dilaudid 1 mg im.   Xrays reviewed/interpreted by me - severe degen changes. No fx.   Pain improved w meds.   Recheck, also c/o pain left lower leg/anterior tibia. +tenderness mid to upper lower leg. No skin changes or cellulitis. Compartments of lower leg soft, not tense. Distal pulses palp. Leg of normal warmth and color. Will add  xrays left tib/fib.   Prednisone po also given.   Po fluids/food.   Ambulate in hall/assistance.   Recheck, pain controlled. No distress.   Will make home health referral (SW, aide, RN, PT) to help facilitate close f/u and safety.  Also rec close f/u with her orthopedist/back/hip specialist and pcp.   Return precautions provided.      Final Clinical Impression(s) / ED Diagnoses Final diagnoses:  None    Rx / DC Orders ED Discharge Orders    None       Cathren LaineSteinl, Pheonix Clinkscale, MD 12/28/20 2040

## 2021-01-07 ENCOUNTER — Ambulatory Visit: Payer: Medicare Other | Admitting: Physical Medicine and Rehabilitation

## 2021-01-07 ENCOUNTER — Telehealth: Payer: Self-pay

## 2021-01-07 NOTE — Telephone Encounter (Signed)
Rescheduled

## 2021-01-07 NOTE — Telephone Encounter (Signed)
Patient called she has a appointment scheduled today at 9:00 she is unable to make it she would like to reschedule her appointment call back:

## 2021-01-15 ENCOUNTER — Encounter: Payer: Self-pay | Admitting: Physical Medicine and Rehabilitation

## 2021-01-15 ENCOUNTER — Other Ambulatory Visit: Payer: Self-pay

## 2021-01-15 ENCOUNTER — Ambulatory Visit (INDEPENDENT_AMBULATORY_CARE_PROVIDER_SITE_OTHER): Payer: Medicare Other | Admitting: Physical Medicine and Rehabilitation

## 2021-01-15 VITALS — BP 133/88 | HR 102

## 2021-01-15 DIAGNOSIS — M48062 Spinal stenosis, lumbar region with neurogenic claudication: Secondary | ICD-10-CM

## 2021-01-15 DIAGNOSIS — M5416 Radiculopathy, lumbar region: Secondary | ICD-10-CM

## 2021-01-15 NOTE — Progress Notes (Signed)
Pt state lower back pain that travels down both legs. Pt state walking, standing and laying down makes the pain worse. Pt state pain meds to help ease her pain.   Numeric Pain Rating Scale and Functional Assessment Average Pain 10 Pain Right Now 10 My pain is constant, sharp and stabbing Pain is worse with: walking, standing and some activites Pain improves with: rest, heat/ice and medication   In the last MONTH (on 0-10 scale) has pain interfered with the following?  1. General activity like being  able to carry out your everyday physical activities such as walking, climbing stairs, carrying groceries, or moving a chair?  Rating(9)  2. Relation with others like being able to carry out your usual social activities and roles such as  activities at home, at work and in your community. Rating(8)  3. Enjoyment of life such that you have  been bothered by emotional problems such as feeling anxious, depressed or irritable?  Rating(8)

## 2021-01-16 NOTE — Progress Notes (Signed)
Veronica Baker - 70 y.o. female MRN [id number]  Date of birth: 10/20/1950  Office Visit Note: Visit Date: 01/15/2021 PCP: Stevphen Rochester, MD Referred by: Stevphen Rochester, MD  Subjective: Chief Complaint  Patient presents with  . Lower Back - Pain  . Left Leg - Pain  . Right Leg - Pain  . Left Thigh - Pain  . Right Thigh - Pain   HPI: Veronica Baker is a 70 y.o. female who comes in today For follow-up and evaluation and management for chronic severe low back pain and bilateral radicular leg pain.  Patient has fairly severe L3-4 and L4-5 central canal stenosis multifactorial.  No evidence of listhesis or instability.  She has had 2 epidural injections both from a transforaminal approach and an interlaminar approach without much relief at all.  She does take gabapentin 100 mg 3 times a day and she has had physical therapy and other medication management without much relief.  She continues to follow with Dr. Lavada Mesi who made the referral to me.  She reports significant pain that really is on relenting at this point and recalcitrant.  No prior history of lumbar surgery.  He rates her pain as 10 out of 10 constant sharp and stabbing pain worse with walking and standing better at rest and using heat and ice and medication.  No focal weakness no bowel or bladder difficulty no other red flag complaints.  Review of Systems  Musculoskeletal: Positive for back pain and joint pain.  All other systems reviewed and are negative.  Otherwise per HPI.  Assessment & Plan: Visit Diagnoses:    ICD-10-CM   1. Spinal stenosis of lumbar region with neurogenic claudication  M48.062   2. Lumbar radiculopathy  M54.16      Plan: Findings:  Chronic several year history of low back pain with bilateral hip and leg pain consistent with neurogenic claudication from moderate severe to severe stenosis at L3-4 and L4-5.  Epidural injections not really affected as his medication.  Today I will start to  increase her gabapentin to try to get her up to at least 300 mg 3 times a day and make referral to Dr. Vira Browns for surgical evaluation for decompression laminectomy.  Should continue to follow-up with Dr. Lavada Mesi as needed.    Meds & Orders: No orders of the defined types were placed in this encounter.  No orders of the defined types were placed in this encounter.   Follow-up: Return for Consult for Dr. Vira Browns.   Procedures: No procedures performed      Clinical History: MRI LUMBAR SPINE WITHOUT CONTRAST  TECHNIQUE: Multiplanar, multisequence MR imaging of the lumbar spine was performed. No intravenous contrast was administered.  COMPARISON:  None.  FINDINGS: Segmentation:  Standard.  Alignment:  Anteroposterior alignment is maintained.  Vertebrae: Vertebral body heights preserved apart mild degenerative endplate irregularity. There is no suspicious osseous lesion. Trace degenerative endplate marrow edema at L4-L5. There is also likely degenerative marrow edema along the left L4 pedicle in proximity to facet arthropathy.  Conus medullaris and cauda equina: Conus extends to the L2 level. Conus and cauda equina appear normal.  Paraspinal and other soft tissues: Unremarkable.  Disc levels: Disc desiccation and height loss L3-L4 through L5-S1. Congenital narrowing of the spinal canal. Imaged in the sagittal plane only, there is a disc bulge and central disc protrusion at T11-T12 contacting the ventral cord.  L1-L2: Minimal disc bulge. Mild facet arthropathy. No  significant canal or foraminal stenosis.  L2-L3: Minimal disc bulge. Mild facet arthropathy. No significant canal right foraminal stenosis. Minor left foraminal stenosis.  L3-L4: Disc bulge with central disc extrusion extending slightly above and below the disc level. Mild facet arthropathy with ligamentum flavum infolding. Moderate to marked canal stenosis with narrowing of the lateral  recesses. Moderate foraminal stenosis.  L4-L5: Disc bulge with endplate osteophytic ridging slightly eccentric to the left. Moderate facet arthropathy with ligamentum flavum infolding. Moderate to marked canal stenosis with narrowing of the left greater than right lateral recesses. Moderate foraminal stenosis.  L5-S1: Disc bulge with endplate osteophytic ridging. Mild facet arthropathy ligamentum flavum infolding. No significant canal stenosis. Moderate foraminal stenosis.  IMPRESSION: Multilevel degenerative changes as detailed above. Canal and lateral recess stenosis greatest at L3-L4 and L4-L5. Foraminal stenosis is present from L3-L4 to L5-S1. Facet arthropathy greatest at L4-L5.  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   Electronically Signed   By: Guadlupe Spanish M.D.   On: 12/27/2019 13:53   She reports that she has never smoked. She has never used smokeless tobacco. No results for input(s): HGBA1C, LABURIC in the last 8760 hours.  Objective:  VS:  HT:    WT:   BMI:     BP:133/88  HR:(!) 102bpm  TEMP: ( )  RESP:  Physical Exam Vitals and nursing note reviewed.  Constitutional:      General: She is not in acute distress.    Appearance: Normal appearance. She is not ill-appearing.  HENT:     Head: Normocephalic and atraumatic.     Right Ear: External ear normal.     Left Ear: External ear normal.  Eyes:     Extraocular Movements: Extraocular movements intact.  Cardiovascular:     Rate and Rhythm: Normal rate.     Pulses: Normal pulses.  Pulmonary:     Effort: Pulmonary effort is normal. No respiratory distress.  Abdominal:     General: There is no distension.     Palpations: Abdomen is soft.  Musculoskeletal:        General: Tenderness present.     Cervical back: Neck supple.     Right lower leg: No edema.     Left lower leg: No edema.     Comments: Patient has good distal strength with no pain over the greater  trochanters.  No clonus or focal weakness.  She has concordant back pain with standing and extension and facet loading.  She has tenderness across the lower back and PSIS.  Strength testing shows that she is somewhat apprehensive on the left hip to flex but does have good strength.  No pain with hip internal rotation  Skin:    Findings: No erythema, lesion or rash.  Neurological:     General: No focal deficit present.     Mental Status: She is alert and oriented to person, place, and time.     Sensory: No sensory deficit.     Motor: No weakness or abnormal muscle tone.     Coordination: Coordination normal.  Psychiatric:        Mood and Affect: Mood normal.        Behavior: Behavior normal.     Ortho Exam  Imaging: No results found.  Past Medical/Family/Surgical/Social History: Medications & Allergies reviewed per EMR, new medications updated. Patient Active Problem List   Diagnosis Date Noted  . Age-related incipient cataract of both eyes 01/15/2020  . History of  colonic polyps 01/15/2020  . Primary open angle glaucoma (POAG) 01/15/2020  . Snoring 01/15/2020  . Hypertensive disorder 10/04/2019  . Chronic heart failure (HCC) 05/11/2019  . Chronic low back pain without sciatica 02/22/2019  . Hypertensive urgency 02/22/2019  . Schizoaffective disorder (HCC) 08/28/2018  . Referred ear pain, left 08/15/2018  . Tongue lesion 08/15/2018  . Hypercholesterolemia 05/09/2018  . Moderate persistent asthma without complication 05/27/2015  . Obesity (BMI 30-39.9) 05/27/2015  . Bipolar affective disorder, currently active (HCC) 05/09/2015  . Chronic back pain 05/09/2015  . History of sarcoidosis 05/09/2015  . Abnormal cardiovascular stress test 09/13/2012  . Diastolic dysfunction 09/13/2012  . SOB (shortness of breath) 09/06/2012   Past Medical History:  Diagnosis Date  . Anemia   . Arthritis   . Asthma   . Blood dyscrasia    polyclonal gamopathy  . Depression    Bipolar  .  Fibromyalgia   . Headache   . Hyperlipidemia   . Hypertension   . Sarcoidosis   . Sleep apnea    Family History  Problem Relation Age of Onset  . Hypertension Brother   . Coronary artery disease Brother   . Asthma Brother   . Alzheimer's disease Mother   . Healthy Son   . Healthy Son    Past Surgical History:  Procedure Laterality Date  . ABDOMINAL HYSTERECTOMY    . APPENDECTOMY    . ARTERY BIOPSY Right 06/16/2017   Procedure: BIOPSY TEMPORAL ARTERY RIGHT;  Surgeon: Larina Earthly, MD;  Location: [facility] OR;  Service: Vascular;  Laterality: Right;  . bilateral oophorectomy    . BREAST SURGERY    . CARDIAC CATHETERIZATION    . COLON SURGERY     colonscopy  . HERNIA REPAIR    . TUBAL LIGATION    . UMBILICAL HERNIA REPAIR     Social History   Occupational History  . Not on file  Tobacco Use  . Smoking status: Never Smoker  . Smokeless tobacco: Never Used  Vaping Use  . Vaping Use: Never used  Substance and Sexual Activity  . Alcohol use: No  . Drug use: No  . Sexual activity: Not on file

## 2021-03-04 ENCOUNTER — Inpatient Hospital Stay (HOSPITAL_COMMUNITY)
Admission: EM | Admit: 2021-03-04 | Discharge: 2021-03-11 | DRG: 065 | Disposition: A | Discharge status: Skilled Nursing Facility | Payer: Medicare Other | Attending: Family Medicine | Admitting: Family Medicine

## 2021-03-04 ENCOUNTER — Encounter (HOSPITAL_COMMUNITY): Payer: Self-pay | Admitting: Emergency Medicine

## 2021-03-04 ENCOUNTER — Other Ambulatory Visit: Payer: Self-pay

## 2021-03-04 ENCOUNTER — Emergency Department (HOSPITAL_COMMUNITY): Payer: Medicare Other

## 2021-03-04 DIAGNOSIS — N390 Urinary tract infection, site not specified: Secondary | ICD-10-CM | POA: Diagnosis present

## 2021-03-04 DIAGNOSIS — D869 Sarcoidosis, unspecified: Secondary | ICD-10-CM | POA: Diagnosis present

## 2021-03-04 DIAGNOSIS — R059 Cough, unspecified: Secondary | ICD-10-CM

## 2021-03-04 DIAGNOSIS — N179 Acute kidney failure, unspecified: Secondary | ICD-10-CM

## 2021-03-04 DIAGNOSIS — E7800 Pure hypercholesterolemia, unspecified: Secondary | ICD-10-CM | POA: Diagnosis present

## 2021-03-04 DIAGNOSIS — G9340 Encephalopathy, unspecified: Secondary | ICD-10-CM | POA: Diagnosis present

## 2021-03-04 DIAGNOSIS — G4733 Obstructive sleep apnea (adult) (pediatric): Secondary | ICD-10-CM | POA: Diagnosis present

## 2021-03-04 DIAGNOSIS — Z20822 Contact with and (suspected) exposure to covid-19: Secondary | ICD-10-CM | POA: Diagnosis present

## 2021-03-04 DIAGNOSIS — R4182 Altered mental status, unspecified: Secondary | ICD-10-CM | POA: Diagnosis not present

## 2021-03-04 DIAGNOSIS — I639 Cerebral infarction, unspecified: Principal | ICD-10-CM | POA: Diagnosis present

## 2021-03-04 DIAGNOSIS — G8929 Other chronic pain: Secondary | ICD-10-CM | POA: Diagnosis present

## 2021-03-04 DIAGNOSIS — I10 Essential (primary) hypertension: Secondary | ICD-10-CM | POA: Diagnosis present

## 2021-03-04 DIAGNOSIS — Y92410 Unspecified street and highway as the place of occurrence of the external cause: Secondary | ICD-10-CM

## 2021-03-04 DIAGNOSIS — D649 Anemia, unspecified: Secondary | ICD-10-CM | POA: Diagnosis present

## 2021-03-04 DIAGNOSIS — Z825 Family history of asthma and other chronic lower respiratory diseases: Secondary | ICD-10-CM

## 2021-03-04 DIAGNOSIS — Z7952 Long term (current) use of systemic steroids: Secondary | ICD-10-CM

## 2021-03-04 DIAGNOSIS — R29709 NIHSS score 9: Secondary | ICD-10-CM | POA: Diagnosis present

## 2021-03-04 DIAGNOSIS — D696 Thrombocytopenia, unspecified: Secondary | ICD-10-CM | POA: Diagnosis present

## 2021-03-04 DIAGNOSIS — I82442 Acute embolism and thrombosis of left tibial vein: Secondary | ICD-10-CM | POA: Diagnosis present

## 2021-03-04 DIAGNOSIS — E669 Obesity, unspecified: Secondary | ICD-10-CM | POA: Diagnosis present

## 2021-03-04 DIAGNOSIS — M797 Fibromyalgia: Secondary | ICD-10-CM | POA: Diagnosis present

## 2021-03-04 DIAGNOSIS — Z888 Allergy status to other drugs, medicaments and biological substances status: Secondary | ICD-10-CM

## 2021-03-04 DIAGNOSIS — F259 Schizoaffective disorder, unspecified: Secondary | ICD-10-CM | POA: Diagnosis present

## 2021-03-04 DIAGNOSIS — M25562 Pain in left knee: Secondary | ICD-10-CM | POA: Diagnosis present

## 2021-03-04 DIAGNOSIS — Z6839 Body mass index (BMI) 39.0-39.9, adult: Secondary | ICD-10-CM

## 2021-03-04 DIAGNOSIS — E785 Hyperlipidemia, unspecified: Secondary | ICD-10-CM | POA: Diagnosis present

## 2021-03-04 DIAGNOSIS — R9431 Abnormal electrocardiogram [ECG] [EKG]: Secondary | ICD-10-CM

## 2021-03-04 DIAGNOSIS — Z880 Allergy status to penicillin: Secondary | ICD-10-CM

## 2021-03-04 DIAGNOSIS — M545 Low back pain, unspecified: Secondary | ICD-10-CM | POA: Diagnosis present

## 2021-03-04 DIAGNOSIS — Z8249 Family history of ischemic heart disease and other diseases of the circulatory system: Secondary | ICD-10-CM

## 2021-03-04 DIAGNOSIS — R471 Dysarthria and anarthria: Secondary | ICD-10-CM | POA: Diagnosis present

## 2021-03-04 DIAGNOSIS — Z885 Allergy status to narcotic agent status: Secondary | ICD-10-CM

## 2021-03-04 DIAGNOSIS — Z820 Family history of epilepsy and other diseases of the nervous system: Secondary | ICD-10-CM

## 2021-03-04 DIAGNOSIS — Z862 Personal history of diseases of the blood and blood-forming organs and certain disorders involving the immune mechanism: Secondary | ICD-10-CM

## 2021-03-04 DIAGNOSIS — E538 Deficiency of other specified B group vitamins: Secondary | ICD-10-CM | POA: Diagnosis present

## 2021-03-04 DIAGNOSIS — Z79899 Other long term (current) drug therapy: Secondary | ICD-10-CM

## 2021-03-04 LAB — URINALYSIS, ROUTINE W REFLEX MICROSCOPIC
Bilirubin Urine: NEGATIVE
Glucose, UA: NEGATIVE mg/dL
Ketones, ur: NEGATIVE mg/dL
Nitrite: NEGATIVE
Protein, ur: 30 mg/dL — AB
Specific Gravity, Urine: 1.005 (ref 1.005–1.030)
WBC, UA: >50 WBC/hpf — ABNORMAL HIGH (ref 0–5)
pH: 6 (ref 5.0–8.0)

## 2021-03-04 LAB — BLOOD GAS, VENOUS
Acid-Base Excess: 0.7 mmol/L (ref 0.0–2.0)
Bicarbonate: 26.4 mmol/L (ref 20.0–28.0)
O2 Saturation: 66.5 %
Patient temperature: 98.6
pCO2, Ven: 50.9 mmHg (ref 44.0–60.0)
pH, Ven: 7.335 (ref 7.250–7.430)
pO2, Ven: 40.7 mmHg (ref 32.0–45.0)

## 2021-03-04 LAB — BASIC METABOLIC PANEL
Anion gap: 8 (ref 5–15)
BUN: 22 mg/dL (ref 8–23)
CO2: 28 mmol/L (ref 22–32)
Calcium: 9 mg/dL (ref 8.9–10.3)
Chloride: 104 mmol/L (ref 98–111)
Creatinine, Ser: 1.96 mg/dL — ABNORMAL HIGH (ref 0.44–1.00)
GFR, Estimated: 27 mL/min — ABNORMAL LOW (ref >60)
Glucose, Bld: 97 mg/dL (ref 70–99)
Potassium: 3.7 mmol/L (ref 3.5–5.1)
Sodium: 140 mmol/L (ref 135–145)

## 2021-03-04 LAB — CBC
HCT: 31.3 % — ABNORMAL LOW (ref 36.0–46.0)
Hemoglobin: 9.8 g/dL — ABNORMAL LOW (ref 12.0–15.0)
MCH: 27.3 pg (ref 26.0–34.0)
MCHC: 31.3 g/dL (ref 30.0–36.0)
MCV: 87.2 fL (ref 80.0–100.0)
Platelets: 148 10*3/uL — ABNORMAL LOW (ref 150–400)
RBC: 3.59 MIL/uL — ABNORMAL LOW (ref 3.87–5.11)
RDW: 12.9 % (ref 11.5–15.5)
WBC: 6.4 10*3/uL (ref 4.0–10.5)
nRBC: 0 % (ref 0.0–0.2)

## 2021-03-04 LAB — RAPID URINE DRUG SCREEN, HOSP PERFORMED
Amphetamines: NOT DETECTED
Barbiturates: NOT DETECTED
Benzodiazepines: NOT DETECTED
Cocaine: NOT DETECTED
Opiates: NOT DETECTED
Tetrahydrocannabinol: NOT DETECTED

## 2021-03-04 LAB — CBG MONITORING, ED: Glucose-Capillary: 89 mg/dL (ref 70–99)

## 2021-03-04 LAB — ETHANOL: Alcohol, Ethyl (B): <10 mg/dL (ref <10)

## 2021-03-04 MED ORDER — SODIUM CHLORIDE 0.9 % IV SOLN: INTRAVENOUS | Status: DC

## 2021-03-04 MED ORDER — NALOXONE HCL 0.4 MG/ML IJ SOLN
0.4000 mg | Freq: Once | INTRAMUSCULAR | Status: AC
  Administered 2021-03-04: 0.4 mg via INTRAVENOUS
  Filled 2021-03-04: qty 1

## 2021-03-04 MED ORDER — SODIUM CHLORIDE 0.9 % IV BOLUS
1000.0000 mL | Freq: Once | INTRAVENOUS | Status: AC
  Administered 2021-03-04: 1000 mL via INTRAVENOUS

## 2021-03-04 NOTE — ED Notes (Signed)
purewick placed on pt per her request
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
## 2021-03-04 NOTE — ED Notes (Signed)
Pt to CT

## 2021-03-05 ENCOUNTER — Inpatient Hospital Stay (HOSPITAL_COMMUNITY): Payer: Medicare Other

## 2021-03-05 ENCOUNTER — Encounter (HOSPITAL_COMMUNITY): Payer: Self-pay | Admitting: Family Medicine

## 2021-03-05 ENCOUNTER — Telehealth (HOSPITAL_COMMUNITY): Payer: Self-pay

## 2021-03-05 DIAGNOSIS — F259 Schizoaffective disorder, unspecified: Secondary | ICD-10-CM | POA: Diagnosis present

## 2021-03-05 DIAGNOSIS — R609 Edema, unspecified: Secondary | ICD-10-CM | POA: Diagnosis not present

## 2021-03-05 DIAGNOSIS — D649 Anemia, unspecified: Secondary | ICD-10-CM

## 2021-03-05 DIAGNOSIS — Y92410 Unspecified street and highway as the place of occurrence of the external cause: Secondary | ICD-10-CM | POA: Diagnosis not present

## 2021-03-05 DIAGNOSIS — Z885 Allergy status to narcotic agent status: Secondary | ICD-10-CM | POA: Diagnosis not present

## 2021-03-05 DIAGNOSIS — I6389 Other cerebral infarction: Secondary | ICD-10-CM | POA: Diagnosis not present

## 2021-03-05 DIAGNOSIS — N179 Acute kidney failure, unspecified: Secondary | ICD-10-CM | POA: Diagnosis not present

## 2021-03-05 DIAGNOSIS — I639 Cerebral infarction, unspecified: Secondary | ICD-10-CM | POA: Diagnosis present

## 2021-03-05 DIAGNOSIS — R4182 Altered mental status, unspecified: Secondary | ICD-10-CM

## 2021-03-05 DIAGNOSIS — E785 Hyperlipidemia, unspecified: Secondary | ICD-10-CM | POA: Diagnosis present

## 2021-03-05 DIAGNOSIS — D696 Thrombocytopenia, unspecified: Secondary | ICD-10-CM | POA: Diagnosis present

## 2021-03-05 DIAGNOSIS — I82442 Acute embolism and thrombosis of left tibial vein: Secondary | ICD-10-CM | POA: Diagnosis present

## 2021-03-05 DIAGNOSIS — M25562 Pain in left knee: Secondary | ICD-10-CM

## 2021-03-05 DIAGNOSIS — D869 Sarcoidosis, unspecified: Secondary | ICD-10-CM | POA: Diagnosis present

## 2021-03-05 DIAGNOSIS — Z7952 Long term (current) use of systemic steroids: Secondary | ICD-10-CM | POA: Diagnosis not present

## 2021-03-05 DIAGNOSIS — I10 Essential (primary) hypertension: Secondary | ICD-10-CM

## 2021-03-05 DIAGNOSIS — R9431 Abnormal electrocardiogram [ECG] [EKG]: Secondary | ICD-10-CM

## 2021-03-05 DIAGNOSIS — D519 Vitamin B12 deficiency anemia, unspecified: Secondary | ICD-10-CM | POA: Diagnosis not present

## 2021-03-05 DIAGNOSIS — Z820 Family history of epilepsy and other diseases of the nervous system: Secondary | ICD-10-CM | POA: Diagnosis not present

## 2021-03-05 DIAGNOSIS — Z20822 Contact with and (suspected) exposure to covid-19: Secondary | ICD-10-CM | POA: Diagnosis present

## 2021-03-05 DIAGNOSIS — Z79899 Other long term (current) drug therapy: Secondary | ICD-10-CM | POA: Diagnosis not present

## 2021-03-05 DIAGNOSIS — Z8249 Family history of ischemic heart disease and other diseases of the circulatory system: Secondary | ICD-10-CM | POA: Diagnosis not present

## 2021-03-05 DIAGNOSIS — G8929 Other chronic pain: Secondary | ICD-10-CM | POA: Diagnosis present

## 2021-03-05 DIAGNOSIS — E669 Obesity, unspecified: Secondary | ICD-10-CM | POA: Diagnosis present

## 2021-03-05 DIAGNOSIS — G9340 Encephalopathy, unspecified: Secondary | ICD-10-CM | POA: Diagnosis present

## 2021-03-05 DIAGNOSIS — E7800 Pure hypercholesterolemia, unspecified: Secondary | ICD-10-CM | POA: Diagnosis present

## 2021-03-05 DIAGNOSIS — Z6839 Body mass index (BMI) 39.0-39.9, adult: Secondary | ICD-10-CM | POA: Diagnosis not present

## 2021-03-05 DIAGNOSIS — Z825 Family history of asthma and other chronic lower respiratory diseases: Secondary | ICD-10-CM | POA: Diagnosis not present

## 2021-03-05 DIAGNOSIS — N390 Urinary tract infection, site not specified: Secondary | ICD-10-CM | POA: Diagnosis present

## 2021-03-05 DIAGNOSIS — M545 Low back pain, unspecified: Secondary | ICD-10-CM | POA: Diagnosis not present

## 2021-03-05 LAB — RESP PANEL BY RT-PCR (FLU A&B, COVID) ARPGX2
Influenza A by PCR: NEGATIVE
Influenza B by PCR: NEGATIVE
SARS Coronavirus 2 by RT PCR: NEGATIVE

## 2021-03-05 LAB — CBC
HCT: 30.3 % — ABNORMAL LOW (ref 36.0–46.0)
Hemoglobin: 9.5 g/dL — ABNORMAL LOW (ref 12.0–15.0)
MCH: 27.1 pg (ref 26.0–34.0)
MCHC: 31.4 g/dL (ref 30.0–36.0)
MCV: 86.6 fL (ref 80.0–100.0)
Platelets: 132 10*3/uL — ABNORMAL LOW (ref 150–400)
RBC: 3.5 MIL/uL — ABNORMAL LOW (ref 3.87–5.11)
RDW: 13 % (ref 11.5–15.5)
WBC: 6.4 10*3/uL (ref 4.0–10.5)
nRBC: 0 % (ref 0.0–0.2)

## 2021-03-05 LAB — BASIC METABOLIC PANEL
Anion gap: 8 (ref 5–15)
BUN: 19 mg/dL (ref 8–23)
CO2: 27 mmol/L (ref 22–32)
Calcium: 9 mg/dL (ref 8.9–10.3)
Chloride: 108 mmol/L (ref 98–111)
Creatinine, Ser: 1.48 mg/dL — ABNORMAL HIGH (ref 0.44–1.00)
GFR, Estimated: 38 mL/min — ABNORMAL LOW (ref >60)
Glucose, Bld: 104 mg/dL — ABNORMAL HIGH (ref 70–99)
Potassium: 3.5 mmol/L (ref 3.5–5.1)
Sodium: 143 mmol/L (ref 135–145)

## 2021-03-05 LAB — HIV ANTIBODY (ROUTINE TESTING W REFLEX): HIV Screen 4th Generation wRfx: NONREACTIVE

## 2021-03-05 MED ORDER — LATANOPROST 0.005 % OP SOLN
1.0000 [drp] | Freq: Every day | OPHTHALMIC | Status: DC
  Administered 2021-03-05 – 2021-03-10 (×7): 1 [drp] via OPHTHALMIC
  Filled 2021-03-05 (×2): qty 2.5

## 2021-03-05 MED ORDER — OXYCODONE-ACETAMINOPHEN 5-325 MG PO TABS
1.0000 | ORAL_TABLET | Freq: Four times a day (QID) | ORAL | Status: DC | PRN
  Administered 2021-03-05 – 2021-03-11 (×7): 1 via ORAL
  Filled 2021-03-05 (×7): qty 1

## 2021-03-05 MED ORDER — ACETAMINOPHEN 650 MG RE SUPP: 650.0000 mg | Freq: Four times a day (QID) | RECTAL | Status: DC | PRN

## 2021-03-05 MED ORDER — AMLODIPINE BESYLATE 5 MG PO TABS
2.5000 mg | ORAL_TABLET | Freq: Every day | ORAL | Status: DC
  Filled 2021-03-05: qty 1

## 2021-03-05 MED ORDER — ASPIRIN EC 81 MG PO TBEC
81.0000 mg | DELAYED_RELEASE_TABLET | Freq: Every day | ORAL | Status: DC
  Administered 2021-03-05 – 2021-03-11 (×7): 81 mg via ORAL
  Filled 2021-03-05 (×7): qty 1

## 2021-03-05 MED ORDER — POTASSIUM CHLORIDE CRYS ER 10 MEQ PO TBCR
40.0000 meq | EXTENDED_RELEASE_TABLET | Freq: Once | ORAL | Status: AC
  Administered 2021-03-05: 40 meq via ORAL
  Filled 2021-03-05: qty 4

## 2021-03-05 MED ORDER — MAGNESIUM SULFATE 2 GM/50ML IV SOLN
2.0000 g | Freq: Once | INTRAVENOUS | Status: AC
  Administered 2021-03-05: 2 g via INTRAVENOUS
  Filled 2021-03-05: qty 50

## 2021-03-05 MED ORDER — ROSUVASTATIN CALCIUM 10 MG PO TABS
20.0000 mg | ORAL_TABLET | Freq: Every day | ORAL | Status: DC
  Administered 2021-03-05 – 2021-03-10 (×7): 20 mg via ORAL
  Filled 2021-03-05 (×3): qty 2
  Filled 2021-03-05: qty 1
  Filled 2021-03-05 (×3): qty 2

## 2021-03-05 MED ORDER — ALBUTEROL SULFATE (2.5 MG/3ML) 0.083% IN NEBU: 2.5000 mg | INHALATION_SOLUTION | RESPIRATORY_TRACT | Status: DC | PRN

## 2021-03-05 MED ORDER — ACETAMINOPHEN 325 MG PO TABS: 650.0000 mg | ORAL_TABLET | Freq: Four times a day (QID) | ORAL | Status: DC | PRN

## 2021-03-05 MED ORDER — ALBUTEROL SULFATE HFA 108 (90 BASE) MCG/ACT IN AERS
2.0000 | INHALATION_SPRAY | RESPIRATORY_TRACT | Status: DC | PRN
  Filled 2021-03-05: qty 6.7

## 2021-03-05 MED ORDER — FOSFOMYCIN TROMETHAMINE 3 G PO PACK
3.0000 g | PACK | Freq: Once | ORAL | Status: AC
  Administered 2021-03-05: 3 g via ORAL
  Filled 2021-03-05: qty 3

## 2021-03-05 MED ORDER — HEPARIN SODIUM (PORCINE) 5000 UNIT/ML IJ SOLN: 5000.0000 [IU] | Freq: Three times a day (TID) | INTRAMUSCULAR | Status: DC

## 2021-03-05 MED ORDER — HEPARIN SODIUM (PORCINE) 5000 UNIT/ML IJ SOLN
5000.0000 [IU] | Freq: Three times a day (TID) | INTRAMUSCULAR | Status: DC
  Administered 2021-03-05 – 2021-03-09 (×13): 5000 [IU] via SUBCUTANEOUS
  Filled 2021-03-05 (×12): qty 1

## 2021-03-05 MED ORDER — GABAPENTIN 100 MG PO CAPS
100.0000 mg | ORAL_CAPSULE | Freq: Three times a day (TID) | ORAL | Status: DC
  Administered 2021-03-05 – 2021-03-11 (×17): 100 mg via ORAL
  Filled 2021-03-05 (×17): qty 1

## 2021-03-05 MED ORDER — MOMETASONE FURO-FORMOTEROL FUM 200-5 MCG/ACT IN AERO
2.0000 | INHALATION_SPRAY | Freq: Two times a day (BID) | RESPIRATORY_TRACT | Status: DC
  Administered 2021-03-05 – 2021-03-11 (×12): 2 via RESPIRATORY_TRACT
  Filled 2021-03-05: qty 8.8

## 2021-03-05 MED ORDER — LACTATED RINGERS IV SOLN: INTRAVENOUS | Status: DC

## 2021-03-05 MED ORDER — QUETIAPINE FUMARATE 100 MG PO TABS
300.0000 mg | ORAL_TABLET | Freq: Every day | ORAL | Status: DC
  Administered 2021-03-05: 300 mg via ORAL
  Filled 2021-03-05: qty 3

## 2021-03-05 NOTE — Progress Notes (Signed)
When going to get pt for her MRI. Pt had EKG leads that seemed foreign. I assume from the EMS ride that brought her to the ED. They were metallic in nature and had to be removed pre MRI. When taking off pt leads she had a small superficial skin tear on the right upper chest. I placed gauze on the area with some paper tape to hold it, RN notified to assess once pt returns.

## 2021-03-05 NOTE — ED Notes (Signed)
Pt to CT-- nad noted. Son at bedside
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
## 2021-03-05 NOTE — ED Notes (Signed)
Patient was given her lunch tray. 
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
## 2021-03-06 ENCOUNTER — Inpatient Hospital Stay (HOSPITAL_COMMUNITY): Payer: Medicare Other

## 2021-03-06 ENCOUNTER — Ambulatory Visit: Payer: Medicare Other | Admitting: Specialist

## 2021-03-06 DIAGNOSIS — R4182 Altered mental status, unspecified: Secondary | ICD-10-CM

## 2021-03-06 DIAGNOSIS — I6389 Other cerebral infarction: Secondary | ICD-10-CM

## 2021-03-06 DIAGNOSIS — I10 Essential (primary) hypertension: Secondary | ICD-10-CM

## 2021-03-06 DIAGNOSIS — R9431 Abnormal electrocardiogram [ECG] [EKG]: Secondary | ICD-10-CM

## 2021-03-06 DIAGNOSIS — G8929 Other chronic pain: Secondary | ICD-10-CM

## 2021-03-06 DIAGNOSIS — I639 Cerebral infarction, unspecified: Secondary | ICD-10-CM

## 2021-03-06 DIAGNOSIS — F251 Schizoaffective disorder, depressive type: Secondary | ICD-10-CM

## 2021-03-06 DIAGNOSIS — M545 Low back pain, unspecified: Secondary | ICD-10-CM

## 2021-03-06 DIAGNOSIS — M25562 Pain in left knee: Secondary | ICD-10-CM

## 2021-03-06 DIAGNOSIS — Z862 Personal history of diseases of the blood and blood-forming organs and certain disorders involving the immune mechanism: Secondary | ICD-10-CM

## 2021-03-06 LAB — BASIC METABOLIC PANEL
Anion gap: 7 (ref 5–15)
BUN: 10 mg/dL (ref 8–23)
CO2: 28 mmol/L (ref 22–32)
Calcium: 8.8 mg/dL — ABNORMAL LOW (ref 8.9–10.3)
Chloride: 103 mmol/L (ref 98–111)
Creatinine, Ser: 0.92 mg/dL (ref 0.44–1.00)
GFR, Estimated: >60 mL/min (ref >60)
Glucose, Bld: 104 mg/dL — ABNORMAL HIGH (ref 70–99)
Potassium: 3.8 mmol/L (ref 3.5–5.1)
Sodium: 138 mmol/L (ref 135–145)

## 2021-03-06 LAB — HEMOGLOBIN A1C
Hgb A1c MFr Bld: 5.5 % (ref 4.8–5.6)
Mean Plasma Glucose: 111.15 mg/dL

## 2021-03-06 LAB — VITAMIN B12: Vitamin B-12: 99 pg/mL — ABNORMAL LOW (ref 180–914)

## 2021-03-06 LAB — LIPID PANEL
Cholesterol: 177 mg/dL (ref 0–200)
HDL: 40 mg/dL — ABNORMAL LOW (ref >40)
LDL Cholesterol: 119 mg/dL — ABNORMAL HIGH (ref 0–99)
Total CHOL/HDL Ratio: 4.4 RATIO
Triglycerides: 88 mg/dL (ref <150)
VLDL: 18 mg/dL (ref 0–40)

## 2021-03-06 LAB — ECHOCARDIOGRAM COMPLETE
Area-P 1/2: 4.39 cm2
Calc EF: 62.8 %
Height: 61 in
S' Lateral: 3.1 cm
Single Plane A2C EF: 64 %
Single Plane A4C EF: 63.5 %
Weight: 3312 oz

## 2021-03-06 LAB — TSH: TSH: 1.906 u[IU]/mL (ref 0.350–4.500)

## 2021-03-06 LAB — MAGNESIUM: Magnesium: 2 mg/dL (ref 1.7–2.4)

## 2021-03-06 MED ORDER — CYANOCOBALAMIN 1000 MCG/ML IJ SOLN
1000.0000 ug | Freq: Once | INTRAMUSCULAR | Status: AC
  Administered 2021-03-06: 1000 ug via INTRAMUSCULAR
  Filled 2021-03-06: qty 1

## 2021-03-06 MED ORDER — VITAMIN B-12 1000 MCG PO TABS
1000.0000 ug | ORAL_TABLET | Freq: Every day | ORAL | Status: DC
  Administered 2021-03-07: 1000 ug via ORAL
  Filled 2021-03-06: qty 1
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
## 2021-03-06 NOTE — Progress Notes (Signed)
Carotid artery duplex has been completed. Preliminary results can be found in CV Proc through chart review.   03/06/21 8:49 AM Olen Cordial RVT
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
## 2021-03-06 NOTE — Progress Notes (Signed)
  Echocardiogram 2D Echocardiogram has been performed.  Veronica Baker 03/06/2021, 12:22 PM
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
## 2021-03-06 NOTE — Progress Notes (Signed)
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 than 50% respiratory variability, suggesting right atrial pressure of 3 mmHg. IAS/Shunts: No atrial level shunt detected by color flow Doppler.  LEFT VENTRICLE PLAX 2D LVIDd:         4.50 cm      Diastology LVIDs:         3.10 cm      LV e' medial:    7.72 cm/s LV PW:         0.70 cm      LV E/e' medial:  7.6 LV IVS:        0.70 cm      LV e' lateral:   10.20 cm/s LVOT diam:     2.10 cm      LV E/e' lateral: 5.7 LV SV:         65 LV SV Index:   34 LVOT Area:     3.46 cm  LV Volumes (MOD) LV vol d, MOD A2C: 104.0 ml LV vol d, MOD A4C: 82.1 ml LV vol s, MOD A2C: 37.4 ml LV vol s, MOD A4C: 30.0 ml LV SV MOD A2C:     66.6 ml LV SV MOD A4C:     82.1 ml LV SV MOD BP:      58.3 ml RIGHT VENTRICLE RV S prime:     12.40 cm/s TAPSE (M-mode): 2.5 cm LEFT ATRIUM             Index       RIGHT ATRIUM           Index LA diam:        3.20 cm 1.67 cm/m  RA Area:      12.00 cm LA Vol (A2C):  43.1 ml 22.49 ml/m RA Volume:   26.80 ml  13.98 ml/m LA Vol (A4C):   36.0 ml 18.78 ml/m LA Biplane Vol: 41.4 ml 21.60 ml/m  AORTIC VALVE LVOT Vmax:   106.00 cm/s LVOT Vmean:  75.200 cm/s LVOT VTI:    0.189 m  AORTA Ao Root diam: 2.90 cm MITRAL VALVE MV Area (PHT): 4.39 cm    SHUNTS MV Decel Time: 173 msec    Systemic VTI:  0.19 m MV E velocity: 58.30 cm/s  Systemic Diam: 2.10 cm MV A velocity: 75.80 cm/s MV E/A ratio:  0.77 Marca Anconaalton Mclean MD Electronically signed by Marca Anconaalton Mclean MD Signature Date/Time: 03/06/2021/2:39:49 PM    Final    VAS US CAROTID  Result Date: 03/06/2021 Carotid Arterial Duplex Study Patient Name:  Veronica Baker  Date of Exam:   03/06/2021 Medical Rec #: [id number]          Accession #:    [id number] Date of Birth: 10/28/1950           Patient Gender: F Patient Age:   70Y [facility] Procedure:      VAS US CAROTID Referring Phys: [id number] Tyrone NineYAN B Mardell Suttles --------------------------------------------------------------------------------  Indications:       CVA. Risk Factors:      Hypertension. Limitations        Today's exam was limited due to patient positioning, patient                    somnolence. Comparison Study:  No prior studies. Performing Technologist: Chanda BusingGregory Collins RVT  Examination Guidelines: A complete evaluation includes B-mode imaging, spectral Doppler, color Doppler, and power Doppler as needed of all accessible portions of each vessel. Bilateral testing is considered an integral part of a complete examination. Limited examinations for reoccurring indications may be performed as noted.  Right Carotid Findings: +----------+--------+--------+--------+-----------------------+--------+           PSV cm/sEDV cm/sStenosisPlaque Description     Comments +----------+--------+--------+--------+-----------------------+--------+ CCA Prox  74      17              smooth and heterogenous          +----------+--------+--------+--------+-----------------------+--------+ CCA Distal78      26              smooth and heterogenous         +----------+--------+--------+--------+-----------------------+--------+ ICA Prox  59      21              smooth and heterogenous         +----------+--------+--------+--------+-----------------------+--------+ ICA Distal79      33                                     tortuous +----------+--------+--------+--------+-----------------------+--------+ ECA       104     23                                              +----------+--------+--------+--------+-----------------------+--------+ +----------+--------+-------+--------+-------------------+           PSV cm/sEDV cmsDescribeArm Pressure (mmHG) +----------+--------+-------+--------+-------------------+ [id number]                                        +----------+--------+-------+--------+-------------------+ +---------+--------+--+--------+--+---------+  VertebralPSV cm/s43EDV cm/s15Antegrade +---------+--------+--+--------+--+---------+  Left Carotid Findings: +----------+--------+--------+--------+-----------------------+--------+           PSV cm/sEDV cm/sStenosisPlaque Description     Comments +----------+--------+--------+--------+-----------------------+--------+ CCA Prox  116     26              smooth and heterogenoustortuous +----------+--------+--------+--------+-----------------------+--------+ CCA Distal85      26              smooth and heterogenous         +----------+--------+--------+--------+-----------------------+--------+ ICA Prox  81      36              calcific                        +----------+--------+--------+--------+-----------------------+--------+ ICA Distal84      42                                     tortuous +----------+--------+--------+--------+-----------------------+--------+ ECA       91      24                                               +----------+--------+--------+--------+-----------------------+--------+ +----------+--------+--------+--------+-------------------+           PSV cm/sEDV cm/sDescribeArm Pressure (mmHG) +----------+--------+--------+--------+-------------------+ [id number]                                         +----------+--------+--------+--------+-------------------+ +---------+--------+--+--------+--+---------+ VertebralPSV cm/s46EDV cm/s17Antegrade +---------+--------+--+--------+--+---------+   Summary: Right Carotid: Velocities in the right ICA are consistent with a 1-39% stenosis. Left Carotid: Velocities in the left ICA are consistent with a 1-39% stenosis. Vertebrals: Bilateral vertebral arteries demonstrate antegrade flow. *See table(s) above for measurements and observations.  Electronically signed by Sherald Hess MD on 03/06/2021 at 5:39:01 PM.    Final     Scheduled Meds:  aspirin EC  81 mg Oral Daily   gabapentin  100 mg Oral TID   heparin  5,000 Units Subcutaneous Q8H   latanoprost  1 drop Both Eyes QHS   mometasone-formoterol  2 puff Inhalation BID   rosuvastatin  20 mg Oral QHS   Continuous Infusions:  sodium chloride Stopped (03/05/21 0335)   lactated ringers 100 mL/hr at 03/06/21 0500     LOS: 1 day   Time spent: 35 minutes.  Tyrone Nine, MD [facility] [email or web address] 03/06/2021, 6:40 PM

## 2021-03-07 ENCOUNTER — Inpatient Hospital Stay (HOSPITAL_COMMUNITY): Payer: Medicare Other

## 2021-03-07 LAB — COMPREHENSIVE METABOLIC PANEL
ALT: 13 U/L (ref 0–44)
AST: 18 U/L (ref 15–41)
Albumin: 2.6 g/dL — ABNORMAL LOW (ref 3.5–5.0)
Alkaline Phosphatase: 71 U/L (ref 38–126)
Anion gap: 7 (ref 5–15)
BUN: 8 mg/dL (ref 8–23)
CO2: 26 mmol/L (ref 22–32)
Calcium: 8.6 mg/dL — ABNORMAL LOW (ref 8.9–10.3)
Chloride: 104 mmol/L (ref 98–111)
Creatinine, Ser: 1.04 mg/dL — ABNORMAL HIGH (ref 0.44–1.00)
GFR, Estimated: 59 mL/min — ABNORMAL LOW (ref >60)
Glucose, Bld: 101 mg/dL — ABNORMAL HIGH (ref 70–99)
Potassium: 4 mmol/L (ref 3.5–5.1)
Sodium: 137 mmol/L (ref 135–145)
Total Bilirubin: 0.6 mg/dL (ref 0.3–1.2)
Total Protein: 5.9 g/dL — ABNORMAL LOW (ref 6.5–8.1)

## 2021-03-07 LAB — CBC
HCT: 29.5 % — ABNORMAL LOW (ref 36.0–46.0)
Hemoglobin: 9.2 g/dL — ABNORMAL LOW (ref 12.0–15.0)
MCH: 27.2 pg (ref 26.0–34.0)
MCHC: 31.2 g/dL (ref 30.0–36.0)
MCV: 87.3 fL (ref 80.0–100.0)
Platelets: 120 10*3/uL — ABNORMAL LOW (ref 150–400)
RBC: 3.38 MIL/uL — ABNORMAL LOW (ref 3.87–5.11)
RDW: 13 % (ref 11.5–15.5)
WBC: 5.8 10*3/uL (ref 4.0–10.5)
nRBC: 0 % (ref 0.0–0.2)

## 2021-03-07 MED ORDER — CYANOCOBALAMIN 1000 MCG/ML IJ SOLN
1000.0000 ug | Freq: Every day | INTRAMUSCULAR | Status: DC
  Administered 2021-03-07 – 2021-03-11 (×5): 1000 ug via INTRAMUSCULAR
  Filled 2021-03-07 (×5): qty 1

## 2021-03-07 NOTE — Progress Notes (Signed)
LDL of 119, will need statin therapy. I would start this and continue ASA 81mg  daily for secondary stroke prevention.   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   She will need aggressive repletion with parenteral B12, with at least a few days of daily injections to begin.   At this time, no further neurological recommendations besides the above and continued PT/OT.   , MD [facility] [phone]  If 7pm- 7am, please page neurology on call as listed in AMION.

## 2021-03-07 NOTE — Progress Notes (Signed)
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 normal in size. Pericardium: There is no evidence of pericardial effusion. Mitral Valve: The mitral valve is normal in structure. No evidence of mitral valve regurgitation. No evidence of mitral valve stenosis. Tricuspid Valve: The tricuspid valve is normal in structure. Tricuspid valve regurgitation is not demonstrated. Aortic Valve: The aortic valve is tricuspid. Aortic valve regurgitation is not visualized. No aortic stenosis is present. Pulmonic Valve: The pulmonic valve was normal in structure. Pulmonic valve regurgitation is not visualized. Aorta: The aortic root is normal in size and structure. Venous: The inferior vena cava is normal in size with greater than 50% respiratory variability, suggesting right atrial pressure of 3 mmHg. IAS/Shunts: No atrial level shunt detected by color flow Doppler.  LEFT VENTRICLE PLAX 2D LVIDd:         4.50 cm      Diastology LVIDs:         3.10 cm      LV e' medial:    7.72 cm/s LV PW:         0.70 cm      LV E/e' medial:  7.6 LV IVS:        0.70 cm      LV e' lateral:    10.20 cm/s LVOT diam:     2.10 cm      LV E/e' lateral: 5.7 LV SV:         65 LV SV Index:   34 LVOT Area:     3.46 cm  LV Volumes (MOD) LV vol d, MOD A2C: 104.0 ml LV vol d, MOD A4C: 82.1 ml LV vol s, MOD A2C: 37.4 ml LV vol s, MOD A4C: 30.0 ml LV SV MOD A2C:     66.6 ml LV SV MOD A4C:     82.1 ml LV SV MOD BP:      58.3 ml RIGHT VENTRICLE RV S prime:     12.40 cm/s TAPSE (M-mode): 2.5 cm LEFT ATRIUM             Index  RIGHT ATRIUM           Index LA diam:        3.20 cm 1.67 cm/m  RA Area:     12.00 cm LA Vol (A2C):   43.1 ml 22.49 ml/m RA Volume:   26.80 ml  13.98 ml/m LA Vol (A4C):   36.0 ml 18.78 ml/m LA Biplane Vol: 41.4 ml 21.60 ml/m  AORTIC VALVE LVOT Vmax:   106.00 cm/s LVOT Vmean:  75.200 cm/s LVOT VTI:    0.189 m  AORTA Ao Root diam: 2.90 cm MITRAL VALVE MV Area (PHT): 4.39 cm    SHUNTS MV Decel Time: 173 msec    Systemic VTI:  0.19 m MV E velocity: 58.30 cm/s  Systemic Diam: 2.10 cm MV A velocity: 75.80 cm/s MV E/A ratio:  0.77 Marca Ancona MD Electronically signed by Marca Ancona MD Signature Date/Time: 03/06/2021/2:39:49 PM    Final    VAS US CAROTID  Result Date: 03/06/2021 Carotid Arterial Duplex Study Patient Name:  Veronica Baker  Date of Exam:   03/06/2021 Medical Rec #: [id number]          Accession #:    [id number] Date of Birth: [birth year]           Patient Gender: F Patient Age:   70Y Exam Location:  [facility] Procedure:      VAS US CAROTID Referring Phys: [id number] Tyrone Nine --------------------------------------------------------------------------------  Indications:       CVA. Risk Factors:      Hypertension. Limitations        Today's exam was limited due to patient positioning, patient                    somnolence. Comparison Study:  No prior studies. Performing Technologist: Chanda Busing RVT  Examination Guidelines: A complete evaluation includes B-mode imaging, spectral Doppler, color Doppler, and power Doppler as needed of all accessible portions of each  vessel. Bilateral testing is considered an integral part of a complete examination. Limited examinations for reoccurring indications may be performed as noted.  Right Carotid Findings: +----------+--------+--------+--------+-----------------------+--------+           PSV cm/sEDV cm/sStenosisPlaque Description     Comments +----------+--------+--------+--------+-----------------------+--------+ CCA Prox  74      17              smooth and heterogenous         +----------+--------+--------+--------+-----------------------+--------+ CCA Distal78      26              smooth and heterogenous         +----------+--------+--------+--------+-----------------------+--------+ ICA Prox  59      21              smooth and heterogenous         +----------+--------+--------+--------+-----------------------+--------+ ICA Distal79      33                                     tortuous +----------+--------+--------+--------+-----------------------+--------+ ECA       104     23                                              +----------+--------+--------+--------+-----------------------+--------+ +----------+--------+-------+--------+-------------------+  PSV cm/sEDV cmsDescribeArm Pressure (mmHG) +----------+--------+-------+--------+-------------------+ [id number]                                        +----------+--------+-------+--------+-------------------+ +---------+--------+--+--------+--+---------+ VertebralPSV cm/s43EDV cm/s15Antegrade +---------+--------+--+--------+--+---------+  Left Carotid Findings: +----------+--------+--------+--------+-----------------------+--------+           PSV cm/sEDV cm/sStenosisPlaque Description     Comments +----------+--------+--------+--------+-----------------------+--------+ CCA Prox  116     26              smooth and heterogenoustortuous  +----------+--------+--------+--------+-----------------------+--------+ CCA Distal85      26              smooth and heterogenous         +----------+--------+--------+--------+-----------------------+--------+ ICA Prox  81      36              calcific                        +----------+--------+--------+--------+-----------------------+--------+ ICA Distal84      42                                     tortuous +----------+--------+--------+--------+-----------------------+--------+ ECA       91      24                                              +----------+--------+--------+--------+-----------------------+--------+ +----------+--------+--------+--------+-------------------+           PSV cm/sEDV cm/sDescribeArm Pressure (mmHG) +----------+--------+--------+--------+-------------------+ [id number]                                         +----------+--------+--------+--------+-------------------+ +---------+--------+--+--------+--+---------+ VertebralPSV cm/s46EDV cm/s17Antegrade +---------+--------+--+--------+--+---------+   Summary: Right Carotid: Velocities in the right ICA are consistent with a 1-39% stenosis. Left Carotid: Velocities in the left ICA are consistent with a 1-39% stenosis. Vertebrals: Bilateral vertebral arteries demonstrate antegrade flow. *See table(s) above for measurements and observations.  Electronically signed by Sherald Hess MD on 03/06/2021 at 5:39:01 PM.    Final     Scheduled Meds:  aspirin EC  81 mg Oral Daily   cyanocobalamin  1,000 mcg Intramuscular Daily   gabapentin  100 mg Oral TID   heparin  5,000 Units Subcutaneous Q8H   latanoprost  1 drop Both Eyes QHS   mometasone-formoterol  2 puff Inhalation BID   rosuvastatin  20 mg Oral QHS   Continuous Infusions:  lactated ringers 100 mL/hr at 03/07/21 1152     LOS: 2 days   Time spent: 25 minutes.  Tyrone Nine, MD [facility] [email or web address] 03/07/2021,  2:50 PM
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
## 2021-03-09 ENCOUNTER — Inpatient Hospital Stay (HOSPITAL_COMMUNITY): Payer: Medicare Other

## 2021-03-09 DIAGNOSIS — R609 Edema, unspecified: Secondary | ICD-10-CM

## 2021-03-09 LAB — CBC WITH DIFFERENTIAL/PLATELET
Abs Immature Granulocytes: 0.02 10*3/uL (ref 0.00–0.07)
Basophils Absolute: 0 10*3/uL (ref 0.0–0.1)
Basophils Relative: 0 %
Eosinophils Absolute: 0.2 10*3/uL (ref 0.0–0.5)
Eosinophils Relative: 2 %
HCT: 34.4 % — ABNORMAL LOW (ref 36.0–46.0)
Hemoglobin: 10.6 g/dL — ABNORMAL LOW (ref 12.0–15.0)
Immature Granulocytes: 0 %
Lymphocytes Relative: 18 %
Lymphs Abs: 1.6 10*3/uL (ref 0.7–4.0)
MCH: 27.1 pg (ref 26.0–34.0)
MCHC: 30.8 g/dL (ref 30.0–36.0)
MCV: 88 fL (ref 80.0–100.0)
Monocytes Absolute: 1.1 10*3/uL — ABNORMAL HIGH (ref 0.1–1.0)
Monocytes Relative: 13 %
Neutro Abs: 5.8 10*3/uL (ref 1.7–7.7)
Neutrophils Relative %: 67 %
Platelets: 156 10*3/uL (ref 150–400)
RBC: 3.91 MIL/uL (ref 3.87–5.11)
RDW: 12.8 % (ref 11.5–15.5)
WBC: 8.7 10*3/uL (ref 4.0–10.5)
nRBC: 0 % (ref 0.0–0.2)

## 2021-03-09 LAB — BASIC METABOLIC PANEL
Anion gap: 7 (ref 5–15)
BUN: 15 mg/dL (ref 8–23)
CO2: 28 mmol/L (ref 22–32)
Calcium: 9 mg/dL (ref 8.9–10.3)
Chloride: 101 mmol/L (ref 98–111)
Creatinine, Ser: 1.22 mg/dL — ABNORMAL HIGH (ref 0.44–1.00)
GFR, Estimated: 48 mL/min — ABNORMAL LOW (ref >60)
Glucose, Bld: 159 mg/dL — ABNORMAL HIGH (ref 70–99)
Potassium: 3.9 mmol/L (ref 3.5–5.1)
Sodium: 136 mmol/L (ref 135–145)

## 2021-03-09 LAB — PROCALCITONIN: Procalcitonin: 0.19 ng/mL

## 2021-03-09 MED ORDER — APIXABAN 5 MG PO TABS: 5.0000 mg | ORAL_TABLET | Freq: Two times a day (BID) | ORAL | Status: DC

## 2021-03-09 MED ORDER — GUAIFENESIN-DM 100-10 MG/5ML PO SYRP
5.0000 mL | ORAL_SOLUTION | ORAL | Status: DC | PRN
  Administered 2021-03-10: 5 mL via ORAL
  Filled 2021-03-09: qty 5

## 2021-03-09 MED ORDER — APIXABAN 5 MG PO TABS
10.0000 mg | ORAL_TABLET | Freq: Two times a day (BID) | ORAL | Status: DC
  Administered 2021-03-09 – 2021-03-11 (×5): 10 mg via ORAL
  Filled 2021-03-09 (×4): qty 2
  Filled 2021-03-09: qty 4
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
## 2021-03-09 NOTE — Progress Notes (Signed)
Lower extremity venous has been completed.   Preliminary results in CV Proc.   Blanch Media 03/09/2021 11:14 AM

## 2021-03-09 NOTE — Discharge Instructions (Signed)
Information on my medicine - ELIQUIS (apixaban) Why was Eliquis prescribed for you? Eliquis was prescribed to treat blood clots that may have been found in the veins of your legs (deep vein thrombosis) or in your lungs (pulmonary embolism) and to reduce the risk of them occurring again.  What do You need to know about Eliquis ? The starting dose is 10 mg (two 5 mg tablets) taken TWICE daily for the FIRST SEVEN (7) DAYS, then on (enter date)  03/16/2021  the dose is reduced to ONE 5 mg tablet taken TWICE daily.  Eliquis may be taken with or without food.   Try to take the dose about the same time in the morning and in the evening. If you have difficulty swallowing the tablet whole please discuss with your pharmacist how to take the medication safely.  Take Eliquis exactly as prescribed and DO NOT stop taking Eliquis without talking to the doctor who prescribed the medication.  Stopping may increase your risk of developing a new blood clot.  Refill your prescription before you run out.  After discharge, you should have regular check-up appointments with your healthcare provider that is prescribing your Eliquis.    What do you do if you miss a dose? If a dose of ELIQUIS is not taken at the scheduled time, take it as soon as possible on the same day and twice-daily administration should be resumed. The dose should not be doubled to make up for a missed dose.  Important Safety Information A possible side effect of Eliquis is bleeding. You should call your healthcare provider right away if you experience any of the following: Bleeding from an injury or your nose that does not stop. Unusual colored urine (red or dark brown) or unusual colored stools (red or black). Unusual bruising for unknown reasons. A serious fall or if you hit your head (even if there is no bleeding).  Some medicines may interact with Eliquis and might increase your risk of bleeding or clotting while on Eliquis. To  help avoid this, consult your healthcare provider or pharmacist prior to using any new prescription or non-prescription medications, including herbals, vitamins, non-steroidal anti-inflammatory drugs (NSAIDs) and supplements.  This website has more information on Eliquis (apixaban): http://www.eliquis.com/eliquis/home

## 2021-03-10 DIAGNOSIS — D519 Vitamin B12 deficiency anemia, unspecified: Secondary | ICD-10-CM

## 2021-03-10 MED ORDER — APIXABAN 5 MG PO TABS: ORAL_TABLET | ORAL | 0 refills | Status: DC

## 2021-03-10 MED ORDER — VITAMIN B-12 1000 MCG PO TABS: 1000.0000 ug | ORAL_TABLET | Freq: Every day | ORAL | 0 refills | Status: AC

## 2021-03-10 MED ORDER — GABAPENTIN 100 MG PO CAPS
100.0000 mg | ORAL_CAPSULE | Freq: Three times a day (TID) | ORAL | Status: DC
Start: 2021-03-10 — End: 2021-06-17
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
## 2021-03-10 NOTE — TOC Progression Note (Addendum)
Transition of Care [facility]) - Progression Note    Patient Details  Name: Veronica Baker MRN: [id number] Date of Birth: [birth year]  Transition of Care [facility]) CM/SW Contact  Ida Rogue, [place] Phone Number: 03/10/2021, 5:06 PM  Clinical Narrative:   Patient has been approved for 3 days, 7/11-7/13 at [facility], [employer] Reference [id number]. Spoke with patient and son about bed offer.  Son is planning on transporting patient himself tomorrow if MD deems this a safe plan.  Per facility, requested updated COVID test and booster shot. TOC will continue to follow during the course of hospitalization.  Addendum:  PASSR [id number]     Expected Discharge Plan: Skilled Nursing Facility Barriers to Discharge: Barriers Resolved  Expected Discharge Plan and Services Expected Discharge Plan: Skilled Nursing Facility   Discharge Planning Services: CM Consult Post Acute Care Choice: Skilled Nursing Facility Living arrangements for the past 2 months: Apartment Expected Discharge Date: 03/10/21                                     Social Determinants of Health (SDOH) Interventions    Readmission Risk Interventions No flowsheet data found.

## 2021-03-10 NOTE — Care Management Important Message (Signed)
Important Message  Patient Details IM Letter given to the Patient. Name: Veronica Baker MRN: [id number] Date of Birth: 15-Jun-1951   Medicare Important Message Given:  Yes     Caren Macadam 03/10/2021, 9:07 AM

## 2021-03-10 NOTE — Plan of Care (Signed)
  Problem: Coping: Goal: Level of anxiety will decrease Outcome: Progressing   Problem: Elimination: Goal: Will not experience complications related to bowel motility Outcome: Progressing   Problem: Pain Managment: Goal: General experience of comfort will improve Outcome: Progressing   Problem: Skin Integrity: Goal: Risk for impaired skin integrity will decrease Outcome: Progressing   

## 2021-03-10 NOTE — Discharge Summary (Addendum)
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 be used) for the  duration of the COVID-19 declaration under Section 564(b)(1) of the Act, 21 U.S.C.section 360bbb-3(b)(1), unless the authorization is terminated  or revoked sooner.       Influenza A by PCR NEGATIVE NEGATIVE Final   Influenza B by PCR NEGATIVE NEGATIVE Final    Comment: (NOTE) The Xpert Xpress SARS-CoV-2/FLU/RSV plus assay is intended as an aid in the diagnosis of influenza from Nasopharyngeal swab specimens and should not be used as a sole basis for treatment. Nasal washings and aspirates are unacceptable for Xpert Xpress SARS-CoV-2/FLU/RSV testing.  Fact Sheet for Patients: BloggerCourse.comhttps://www.fda.gov/media/152166/download  Fact Sheet for Healthcare Providers: SeriousBroker.ithttps://www.fda.gov/media/152162/download  This test is not yet approved or cleared by the [place] FDA and has been authorized for detection and/or diagnosis of SARS-CoV-2 by FDA under an Emergency Use Authorization (EUA). This EUA will remain in effect (meaning this test can be used) for the duration of the COVID-19 declaration under Section 564(b)(1) of the Act, 21 U.S.C. section 360bbb-3(b)(1), unless the authorization is terminated or revoked.  Performed at [facility], [address] [place], [place] [address]     Time coordinating discharge: Approximately 40 minutes  Tyrone Nineyan B Riva Sesma, MD  [facility] 03/10/2021, 12:17 PM

## 2021-03-11 LAB — SARS CORONAVIRUS 2 (TAT 6-24 HRS): SARS Coronavirus 2: NEGATIVE

## 2021-03-11 MED ORDER — COVID-19 MRNA VACC (MODERNA) 50 MCG/0.25ML IM SUSP
0.2500 mL | Freq: Once | INTRAMUSCULAR | Status: AC
  Administered 2021-03-11: 0.25 mL via INTRAMUSCULAR
  Filled 2021-03-11: qty 0.25

## 2021-03-11 NOTE — TOC Transition Note (Signed)
Transition of Care [facility]) - CM/SW Discharge Note   Patient Details  Name: AMALIE KORAN MRN: [id number] Date of Birth: [birth year]  Transition of Care [facility]) CM/SW Contact:  Ida Rogue, LCSW Phone Number: 03/11/2021, 11:56 AM   Clinical Narrative:   Patient who is stable for d/c will transfer to [facility] today.  Received Moderna COVID booster. Son to transport. Nursing, please call report to [phone], room 125. TOC sign off.    Final next level of care: Skilled Nursing Facility Barriers to Discharge: Barriers Resolved   Patient Goals and CMS Choice     Choice offered to / list presented to : Adult Children, Sibling  Discharge Placement                       Discharge Plan and Services   Discharge Planning Services: CM Consult Post Acute Care Choice: Skilled Nursing Facility                               Social Determinants of Health (SDOH) Interventions     Readmission Risk Interventions No flowsheet data found.

## 2021-03-11 NOTE — Plan of Care (Signed)
  Problem: Education: Goal: Knowledge of General Education information will improve Description: Including pain rating scale, medication(s)/side effects and non-pharmacologic comfort measures Outcome: Adequate for Discharge   Problem: Health Behavior/Discharge Planning: Goal: Ability to manage health-related needs will improve Outcome: Adequate for Discharge   Problem: Clinical Measurements: Goal: Ability to maintain clinical measurements within normal limits will improve Outcome: Adequate for Discharge Goal: Will remain free from infection Outcome: Adequate for Discharge Goal: Diagnostic test results will improve Outcome: Adequate for Discharge Goal: Respiratory complications will improve Outcome: Adequate for Discharge Goal: Cardiovascular complication will be avoided Outcome: Adequate for Discharge   Problem: Activity: Goal: Risk for activity intolerance will decrease Outcome: Adequate for Discharge   Problem: Nutrition: Goal: Adequate nutrition will be maintained Outcome: Adequate for Discharge   Problem: Coping: Goal: Level of anxiety will decrease Outcome: Adequate for Discharge   Problem: Elimination: Goal: Will not experience complications related to bowel motility Outcome: Adequate for Discharge Goal: Will not experience complications related to urinary retention Outcome: Adequate for Discharge   Problem: Pain Managment: Goal: General experience of comfort will improve Outcome: Adequate for Discharge   Problem: Safety: Goal: Ability to remain free from injury will improve Outcome: Adequate for Discharge   Problem: Skin Integrity: Goal: Risk for impaired skin integrity will decrease Outcome: Adequate for Discharge   Problem: Education: Goal: Knowledge of condition and prescribed therapy will improve Outcome: Adequate for Discharge   Problem: Cardiac: Goal: Will achieve and/or maintain adequate cardiac output Outcome: Adequate for Discharge   Problem:  Physical Regulation: Goal: Complications related to the disease process, condition or treatment will be avoided or minimized Outcome: Adequate for Discharge   

## 2021-03-14 LAB — VITAMIN B1: Vitamin B1 (Thiamine): 88.3 nmol/L (ref 66.5–200.0)

## 2021-03-23 ENCOUNTER — Other Ambulatory Visit: Payer: Self-pay | Admitting: Psychiatry

## 2021-03-23 ENCOUNTER — Other Ambulatory Visit: Payer: Self-pay | Admitting: Family Medicine

## 2021-03-23 DIAGNOSIS — F411 Generalized anxiety disorder: Secondary | ICD-10-CM

## 2021-03-23 DIAGNOSIS — F251 Schizoaffective disorder, depressive type: Secondary | ICD-10-CM

## 2021-04-27 ENCOUNTER — Emergency Department (HOSPITAL_COMMUNITY): Payer: Medicare Other

## 2021-04-27 ENCOUNTER — Emergency Department (HOSPITAL_COMMUNITY)
Admission: EM | Admit: 2021-04-27 | Discharge: 2021-04-27 | Disposition: A | Discharge status: Home / Self Care | Payer: Medicare Other | Attending: Emergency Medicine | Admitting: Emergency Medicine

## 2021-04-27 ENCOUNTER — Other Ambulatory Visit: Payer: Self-pay

## 2021-04-27 DIAGNOSIS — M84359A Stress fracture, hip, unspecified, initial encounter for fracture: Secondary | ICD-10-CM

## 2021-04-27 DIAGNOSIS — M84352A Stress fracture, left femur, initial encounter for fracture: Secondary | ICD-10-CM | POA: Diagnosis not present

## 2021-04-27 DIAGNOSIS — I509 Heart failure, unspecified: Secondary | ICD-10-CM | POA: Diagnosis not present

## 2021-04-27 DIAGNOSIS — M25552 Pain in left hip: Secondary | ICD-10-CM

## 2021-04-27 DIAGNOSIS — S79912A Unspecified injury of left hip, initial encounter: Secondary | ICD-10-CM | POA: Diagnosis present

## 2021-04-27 DIAGNOSIS — Z7901 Long term (current) use of anticoagulants: Secondary | ICD-10-CM | POA: Diagnosis not present

## 2021-04-27 DIAGNOSIS — W19XXXA Unspecified fall, initial encounter: Secondary | ICD-10-CM

## 2021-04-27 DIAGNOSIS — R4182 Altered mental status, unspecified: Secondary | ICD-10-CM | POA: Insufficient documentation

## 2021-04-27 DIAGNOSIS — M1991 Primary osteoarthritis, unspecified site: Secondary | ICD-10-CM | POA: Diagnosis not present

## 2021-04-27 DIAGNOSIS — W010XXD Fall on same level from slipping, tripping and stumbling without subsequent striking against object, subsequent encounter: Secondary | ICD-10-CM | POA: Insufficient documentation

## 2021-04-27 DIAGNOSIS — I110 Hypertensive heart disease with heart failure: Secondary | ICD-10-CM | POA: Diagnosis not present

## 2021-04-27 DIAGNOSIS — J45909 Unspecified asthma, uncomplicated: Secondary | ICD-10-CM | POA: Diagnosis not present

## 2021-04-27 LAB — BASIC METABOLIC PANEL
Anion gap: 7 (ref 5–15)
BUN: 9 mg/dL (ref 8–23)
CO2: 28 mmol/L (ref 22–32)
Calcium: 9.1 mg/dL (ref 8.9–10.3)
Chloride: 104 mmol/L (ref 98–111)
Creatinine, Ser: 1.02 mg/dL — ABNORMAL HIGH (ref 0.44–1.00)
GFR, Estimated: 60 mL/min — ABNORMAL LOW (ref >60)
Glucose, Bld: 103 mg/dL — ABNORMAL HIGH (ref 70–99)
Potassium: 3.4 mmol/L — ABNORMAL LOW (ref 3.5–5.1)
Sodium: 139 mmol/L (ref 135–145)

## 2021-04-27 LAB — I-STAT VENOUS BLOOD GAS, ED
Acid-Base Excess: 8 mmol/L — ABNORMAL HIGH (ref 0.0–2.0)
Bicarbonate: 33.6 mmol/L — ABNORMAL HIGH (ref 20.0–28.0)
Calcium, Ion: 1.2 mmol/L (ref 1.15–1.40)
HCT: 26 % — ABNORMAL LOW (ref 36.0–46.0)
Hemoglobin: 8.8 g/dL — ABNORMAL LOW (ref 12.0–15.0)
O2 Saturation: 99 %
Potassium: 3.8 mmol/L (ref 3.5–5.1)
Sodium: 140 mmol/L (ref 135–145)
TCO2: 35 mmol/L — ABNORMAL HIGH (ref 22–32)
pCO2, Ven: 49.2 mmHg (ref 44.0–60.0)
pH, Ven: 7.443 — ABNORMAL HIGH (ref 7.250–7.430)
pO2, Ven: 143 mmHg — ABNORMAL HIGH (ref 32.0–45.0)

## 2021-04-27 LAB — CBC WITH DIFFERENTIAL/PLATELET
Abs Immature Granulocytes: 0.01 10*3/uL (ref 0.00–0.07)
Basophils Absolute: 0 10*3/uL (ref 0.0–0.1)
Basophils Relative: 1 %
Eosinophils Absolute: 0.2 10*3/uL (ref 0.0–0.5)
Eosinophils Relative: 4 %
HCT: 27.4 % — ABNORMAL LOW (ref 36.0–46.0)
Hemoglobin: 8.2 g/dL — ABNORMAL LOW (ref 12.0–15.0)
Immature Granulocytes: 0 %
Lymphocytes Relative: 29 %
Lymphs Abs: 1.8 10*3/uL (ref 0.7–4.0)
MCH: 26.5 pg (ref 26.0–34.0)
MCHC: 29.9 g/dL — ABNORMAL LOW (ref 30.0–36.0)
MCV: 88.7 fL (ref 80.0–100.0)
Monocytes Absolute: 0.5 10*3/uL (ref 0.1–1.0)
Monocytes Relative: 8 %
Neutro Abs: 3.5 10*3/uL (ref 1.7–7.7)
Neutrophils Relative %: 58 %
Platelets: 149 10*3/uL — ABNORMAL LOW (ref 150–400)
RBC: 3.09 MIL/uL — ABNORMAL LOW (ref 3.87–5.11)
RDW: 13.7 % (ref 11.5–15.5)
WBC: 6.1 10*3/uL (ref 4.0–10.5)
nRBC: 0 % (ref 0.0–0.2)

## 2021-04-27 LAB — CK: Total CK: 65 U/L (ref 38–234)

## 2021-04-27 MED ORDER — OXYCODONE HCL 5 MG PO TABS: 2.5000 mg | ORAL_TABLET | ORAL | 0 refills | Status: DC | PRN

## 2021-04-27 MED ORDER — FENTANYL CITRATE PF 50 MCG/ML IJ SOSY
25.0000 ug | PREFILLED_SYRINGE | Freq: Once | INTRAMUSCULAR | Status: AC
  Administered 2021-04-27: 25 ug via INTRAVENOUS
  Filled 2021-04-27: qty 1

## 2021-04-27 MED ORDER — LIDOCAINE 5 % EX PTCH: 1.0000 | MEDICATED_PATCH | CUTANEOUS | 0 refills | Status: DC

## 2021-04-27 MED ORDER — FENTANYL CITRATE PF 50 MCG/ML IJ SOSY
PREFILLED_SYRINGE | INTRAMUSCULAR | Status: AC
  Administered 2021-04-27: 25 ug via INTRAVENOUS
  Filled 2021-04-27: qty 1

## 2021-04-27 MED ORDER — FENTANYL CITRATE PF 50 MCG/ML IJ SOSY: 25.0000 ug | PREFILLED_SYRINGE | Freq: Once | INTRAMUSCULAR | Status: AC
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
## 2021-04-27 NOTE — ED Triage Notes (Signed)
Pt arrived via [facility] with c/c of fall. Per EMS pt had unwitnessed fall and was found on ground by neighbor. Pt states she was on floor since 0800 this AM. Pt has complaints of left hip and shoulder pain. Unknown if pt had LOC and pt unsure how she fell. Pt was on eliquis until 10 days ago.  fent,  300 NSS 104/66, 90HR, 97% RA, CBG 129

## 2021-04-27 NOTE — Discharge Instructions (Addendum)
You may take acetaminophen/tylenol 1000 mg 4 times a day for 1 week. This is the maximum dose of Tylenol you can take from all sources. Please check other over-the-counter medications and prescriptions to ensure you are not taking other medications that contain acetaminophen.  Take oxycodone as needed for breakthrough pain.  This medication can be addicting, sedating and cause constipation.

## 2021-04-27 NOTE — ED Notes (Signed)
Patient transported to CT 

## 2021-04-27 NOTE — ED Notes (Signed)
Report given to Keith W, RN   

## 2021-04-28 ENCOUNTER — Telehealth (HOSPITAL_COMMUNITY): Payer: Self-pay | Admitting: Emergency Medicine

## 2021-05-29 ENCOUNTER — Emergency Department (HOSPITAL_COMMUNITY): Payer: Medicare Other

## 2021-05-29 ENCOUNTER — Inpatient Hospital Stay (HOSPITAL_COMMUNITY)
Admission: EM | Admit: 2021-05-29 | Discharge: 2021-06-04 | DRG: 315 | Disposition: A | Discharge status: Skilled Nursing Facility | Payer: Medicare Other | Attending: Internal Medicine | Admitting: Internal Medicine

## 2021-05-29 DIAGNOSIS — F259 Schizoaffective disorder, unspecified: Secondary | ICD-10-CM

## 2021-05-29 DIAGNOSIS — T1490XA Injury, unspecified, initial encounter: Secondary | ICD-10-CM

## 2021-05-29 DIAGNOSIS — M545 Low back pain, unspecified: Secondary | ICD-10-CM

## 2021-05-29 DIAGNOSIS — I10 Essential (primary) hypertension: Secondary | ICD-10-CM

## 2021-05-29 DIAGNOSIS — E861 Hypovolemia: Secondary | ICD-10-CM

## 2021-05-29 DIAGNOSIS — F319 Bipolar disorder, unspecified: Secondary | ICD-10-CM

## 2021-05-29 DIAGNOSIS — G8929 Other chronic pain: Secondary | ICD-10-CM

## 2021-05-29 DIAGNOSIS — N179 Acute kidney failure, unspecified: Secondary | ICD-10-CM

## 2021-05-29 DIAGNOSIS — W19XXXA Unspecified fall, initial encounter: Principal | ICD-10-CM

## 2021-05-29 DIAGNOSIS — I959 Hypotension, unspecified: Secondary | ICD-10-CM

## 2021-05-29 DIAGNOSIS — I9589 Other hypotension: Secondary | ICD-10-CM | POA: Diagnosis not present

## 2021-05-29 DIAGNOSIS — D869 Sarcoidosis, unspecified: Secondary | ICD-10-CM | POA: Diagnosis present

## 2021-05-29 DIAGNOSIS — E785 Hyperlipidemia, unspecified: Secondary | ICD-10-CM | POA: Diagnosis present

## 2021-05-29 DIAGNOSIS — I5032 Chronic diastolic (congestive) heart failure: Secondary | ICD-10-CM | POA: Diagnosis present

## 2021-05-29 DIAGNOSIS — Z888 Allergy status to other drugs, medicaments and biological substances status: Secondary | ICD-10-CM

## 2021-05-29 DIAGNOSIS — E860 Dehydration: Secondary | ICD-10-CM | POA: Diagnosis present

## 2021-05-29 DIAGNOSIS — Z6834 Body mass index (BMI) 34.0-34.9, adult: Secondary | ICD-10-CM

## 2021-05-29 DIAGNOSIS — G629 Polyneuropathy, unspecified: Secondary | ICD-10-CM | POA: Diagnosis present

## 2021-05-29 DIAGNOSIS — Z880 Allergy status to penicillin: Secondary | ICD-10-CM

## 2021-05-29 DIAGNOSIS — Z20822 Contact with and (suspected) exposure to covid-19: Secondary | ICD-10-CM | POA: Diagnosis present

## 2021-05-29 DIAGNOSIS — E669 Obesity, unspecified: Secondary | ICD-10-CM | POA: Diagnosis present

## 2021-05-29 DIAGNOSIS — Z886 Allergy status to analgesic agent status: Secondary | ICD-10-CM

## 2021-05-29 DIAGNOSIS — Z86718 Personal history of other venous thrombosis and embolism: Secondary | ICD-10-CM

## 2021-05-29 DIAGNOSIS — I110 Hypertensive heart disease with heart failure: Secondary | ICD-10-CM | POA: Diagnosis present

## 2021-05-29 HISTORY — DX: Cerebral infarction, unspecified: I63.9

## 2021-05-29 HISTORY — DX: Polyneuropathy, unspecified: G62.9

## 2021-05-29 HISTORY — DX: Fibromyalgia: M79.7

## 2021-05-29 HISTORY — DX: Bipolar disorder, unspecified: F31.9

## 2021-05-29 HISTORY — DX: Prediabetes: R73.03

## 2021-05-29 HISTORY — DX: Essential (primary) hypertension: I10

## 2021-05-29 HISTORY — DX: Unspecified osteoarthritis, unspecified site: M19.90

## 2021-05-29 LAB — COMPREHENSIVE METABOLIC PANEL
ALT: 19 U/L (ref 0–44)
AST: 29 U/L (ref 15–41)
Albumin: 3.4 g/dL — ABNORMAL LOW (ref 3.5–5.0)
Alkaline Phosphatase: 89 U/L (ref 38–126)
Anion gap: 10 (ref 5–15)
BUN: 7 mg/dL — ABNORMAL LOW (ref 8–23)
CO2: 26 mmol/L (ref 22–32)
Calcium: 9.8 mg/dL (ref 8.9–10.3)
Chloride: 103 mmol/L (ref 98–111)
Creatinine, Ser: 0.87 mg/dL (ref 0.44–1.00)
GFR, Estimated: >60 mL/min (ref >60)
Glucose, Bld: 109 mg/dL — ABNORMAL HIGH (ref 70–99)
Potassium: 3.4 mmol/L — ABNORMAL LOW (ref 3.5–5.1)
Sodium: 139 mmol/L (ref 135–145)
Total Bilirubin: 0.5 mg/dL (ref 0.3–1.2)
Total Protein: 7.4 g/dL (ref 6.5–8.1)

## 2021-05-29 LAB — CBC
HCT: 35.3 % — ABNORMAL LOW (ref 36.0–46.0)
Hemoglobin: 10.9 g/dL — ABNORMAL LOW (ref 12.0–15.0)
MCH: 26.5 pg (ref 26.0–34.0)
MCHC: 30.9 g/dL (ref 30.0–36.0)
MCV: 85.7 fL (ref 80.0–100.0)
Platelets: 160 10*3/uL (ref 150–400)
RBC: 4.12 MIL/uL (ref 3.87–5.11)
RDW: 13.8 % (ref 11.5–15.5)
WBC: 7.4 10*3/uL (ref 4.0–10.5)
nRBC: 0 % (ref 0.0–0.2)

## 2021-05-29 LAB — I-STAT CHEM 8, ED
BUN: 8 mg/dL (ref 8–23)
Calcium, Ion: 1.22 mmol/L (ref 1.15–1.40)
Chloride: 100 mmol/L (ref 98–111)
Creatinine, Ser: 0.8 mg/dL (ref 0.44–1.00)
Glucose, Bld: 122 mg/dL — ABNORMAL HIGH (ref 70–99)
HCT: 35 % — ABNORMAL LOW (ref 36.0–46.0)
Hemoglobin: 11.9 g/dL — ABNORMAL LOW (ref 12.0–15.0)
Potassium: 3.4 mmol/L — ABNORMAL LOW (ref 3.5–5.1)
Sodium: 141 mmol/L (ref 135–145)
TCO2: 29 mmol/L (ref 22–32)

## 2021-05-29 LAB — PROTIME-INR
INR: 1 (ref 0.8–1.2)
Prothrombin Time: 13.2 seconds (ref 11.4–15.2)

## 2021-05-29 LAB — ETHANOL: Alcohol, Ethyl (B): <10 mg/dL (ref <10)

## 2021-05-29 LAB — SAMPLE TO BLOOD BANK

## 2021-05-29 LAB — LACTIC ACID, PLASMA: Lactic Acid, Venous: 1.8 mmol/L (ref 0.5–1.9)

## 2021-05-29 MED ORDER — ACETAMINOPHEN 500 MG PO TABS
1000.0000 mg | ORAL_TABLET | Freq: Once | ORAL | Status: AC
  Administered 2021-05-30: 1000 mg via ORAL
  Filled 2021-05-29: qty 2

## 2021-05-29 MED ORDER — ACETAMINOPHEN 325 MG PO TABS
650.0000 mg | ORAL_TABLET | ORAL | Status: DC | PRN
  Administered 2021-05-30 – 2021-06-04 (×8): 650 mg via ORAL
  Filled 2021-05-29 (×8): qty 2

## 2021-05-29 MED ORDER — ALUM & MAG HYDROXIDE-SIMETH 200-200-20 MG/5ML PO SUSP: 30.0000 mL | Freq: Four times a day (QID) | ORAL | Status: DC | PRN

## 2021-05-29 MED ORDER — ONDANSETRON HCL 4 MG PO TABS: 4.0000 mg | ORAL_TABLET | Freq: Three times a day (TID) | ORAL | Status: DC | PRN
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
## 2021-05-29 NOTE — Progress Notes (Signed)
Orthopedic Tech Progress Note Patient Details:  Veronica Baker [birth year] [id number]  Level 2 Trauma  Patient ID: Veronica Baker, female   DOB: 02-04-1951, 70 y.o.   MRN: [id number]  Docia Furl 05/29/2021, 11:07 PM

## 2021-05-29 NOTE — ED Provider Notes (Signed)
[facility] EMERGENCY DEPARTMENT Provider Note   CSN: [id number] Arrival date & time: 05/29/21  2110     History Chief Complaint  Patient presents with   Veronica Baker    Veronica Baker is a 70 y.o. female.  HPI    70 year old female comes in with chief complaint of fall. Patient has history of DVT on Eliquis, hypertension, hyperlipidemia.  She lives at her home by herself.  Normally she requires a manual wheelchair to get around.  According to the patient, she was trying to reach for a water bottle and fell.  Allegedly there were 2 falls today and 2 falls yesterday.  Patient struck a coffee top table as she was falling and is having mild headache.  Review of systems also positive for pain over her bilateral hips, left worse than right, lower extremity bilaterally, right elbow and neck.  Spoke with patient's son Mr. [name] and niece who was at the bedside.  Allegedly patient was active and able to drive earlier in the summer.  She had an admission to rehab facility about 3 months ago.  She has never recovered fully since then.  Patient denies any recent illnesses, fevers, chills, UTI-like symptoms.  No past medical history on file.  There are no problems to display for this patient.   OB History   No obstetric history on file.     No family history on file.     Home Medications Prior to Admission medications   Medication Sig Start Date End Date Taking? Authorizing Provider  albuterol (VENTOLIN HFA) 108 (90 Base) MCG/ACT inhaler Inhale 2 puffs into the lungs every 6 (six) hours as needed for wheezing or shortness of breath. 04/06/21  Yes [provider]  amLODipine (NORVASC) 5 MG tablet Take 2.5 mg by mouth every morning. 05/14/21  Yes [provider]  chlorthalidone (HYGROTON) 25 MG tablet Take 25 mg by mouth daily. 04/06/21  Yes [provider]  citalopram (CELEXA) 20 MG tablet Take 20 mg by mouth every morning. 05/14/21  Yes [provider]  Cyanocobalamin (B-12) 1000 MCG SUBL Take 1 tablet by mouth daily. 03/27/21  Yes [provider]  ELIQUIS 5 MG TABS tablet Take 5 mg by mouth 2 (two) times daily. 05/29/21  Yes [provider]  ferrous sulfate 325 (65 FE) MG tablet Take 325 mg by mouth daily. 04/06/21  Yes [provider]  furosemide (LASIX) 20 MG tablet Take 20 mg by mouth daily. 05/29/21  Yes [provider]  gabapentin (NEURONTIN) 100 MG capsule Take 100-200 mg by mouth See admin instructions. 100 mg in the morning and afternoon 200 mg at bedtime 05/23/21  Yes [provider]  latanoprost (XALATAN) 0.005 % ophthalmic solution Place 1 drop into both eyes at bedtime. 05/29/21  Yes [provider]  lisinopril (ZESTRIL) 10 MG tablet Take 10 mg by mouth daily. 04/06/21  Yes [provider]  loratadine (CLARITIN) 10 MG tablet Take 10 mg by mouth daily. 04/01/21  Yes [provider]  Multiple Vitamins-Minerals ([employer]S ONE DAILY ESSENTIAL) TABS Take 1 tablet by mouth daily. 04/06/21  Yes [provider]  nabumetone (RELAFEN) 500 MG tablet Take 500 mg by mouth every morning. 05/14/21  Yes [provider]  rosuvastatin (CRESTOR) 20 MG tablet Take 20 mg by mouth at bedtime. 05/03/21  Yes [provider]  Monte Fantasia INHUB 250-50 MCG/ACT AEPB Inhale 1 puff into the lungs 2 (two) times daily. 04/10/21  Yes [provider]    Allergies    Celebrex [celecoxib], Penicillins, Quetiapine, and Ultram [tramadol]  Review of Systems   Review of Systems  Constitutional:  Positive for activity change.  Respiratory:  Negative for shortness of breath.   Cardiovascular:  Negative for chest pain.  Musculoskeletal:  Positive for arthralgias and myalgias.  Allergic/Immunologic: Negative for immunocompromised state.  Neurological:  Negative for headaches.  Hematological:  Bruises/bleeds easily.  All other systems reviewed and are negative.  Physical  Exam Updated Vital Signs BP (!) 132/91 (BP Location: Right Arm)   Pulse 100   Temp (!) 97.4 F (36.3 C) (Temporal)   Resp (!) 21   Ht 5\' 4"  (1.626 m)   Wt 90.7 kg   SpO2 98%   BMI 34.33 kg/m   Physical Exam Vitals and nursing note reviewed.  Constitutional:      Appearance: She is well-developed.  HENT:     Head: Normocephalic and atraumatic.  Eyes:     Pupils: Pupils are equal, round, and reactive to light.  Neck:     Comments: C-spine and paraspinal tenderness Cardiovascular:     Rate and Rhythm: Normal rate and regular rhythm.  Pulmonary:     Effort: Pulmonary effort is normal. No respiratory distress.     Breath sounds: Normal breath sounds.  Chest:     Chest wall: No tenderness.  Abdominal:     General: Bowel sounds are normal. There is no distension.     Palpations: Abdomen is soft.     Tenderness: There is no abdominal tenderness.  Musculoskeletal:        General: Tenderness present.     Comments:  No long bone deformity. Tenderness to palpation over the right elbow, bilateral hip, left worse than right. And there is also bilateral lower extremity swelling/edema without any gross deformity.  She has diffuse tenderness without any evidence of ecchymosis.  Skin:    General: Skin is warm and dry.     Findings: No rash.  Neurological:     Mental Status: She is alert and oriented to person, place, and time.     Cranial Nerves: No cranial nerve deficit.    ED Results / Procedures / Treatments   Labs (all labs ordered are listed, but only abnormal results are displayed) Labs Reviewed  COMPREHENSIVE METABOLIC PANEL - Abnormal; Notable for the following components:      Result Value   Potassium 3.4 (*)    Glucose, Bld 109 (*)    BUN 7 (*)    Albumin 3.4 (*)    All other components within normal limits  CBC - Abnormal; Notable for the following components:   Hemoglobin 10.9 (*)    HCT 35.3 (*)    All other components within normal limits  I-STAT CHEM 8, ED  - Abnormal; Notable for the following components:   Potassium 3.4 (*)    Glucose, Bld 122 (*)    Hemoglobin 11.9 (*)    HCT 35.0 (*)    All other components within normal limits  RESP PANEL BY RT-PCR (FLU A&B, COVID) ARPGX2  ETHANOL  LACTIC ACID, PLASMA  PROTIME-INR  URINALYSIS, ROUTINE W REFLEX MICROSCOPIC  SAMPLE TO BLOOD BANK    EKG None  Radiology DG Elbow Complete Right  Result Date: 05/29/2021 CLINICAL DATA:  Trauma.  Fall. EXAM: RIGHT ELBOW - COMPLETE 3+ VIEW COMPARISON:  None. FINDINGS: There is soft tissue swelling posterior to the elbow. There is no significant joint effusion. Antecubital IV is  present. There is no acute fracture or dislocation identified. Joint spaces are maintained. IMPRESSION: 1. No acute bony abnormality. 2. Posterior soft tissue swelling. Electronically Signed   By: Darliss Cheney M.D.   On: 05/29/2021 23:43   CT HEAD WO CONTRAST  Result Date: 05/29/2021 CLINICAL DATA:  Facial trauma. EXAM: CT HEAD WITHOUT CONTRAST TECHNIQUE: Contiguous axial images were obtained from the base of the skull through the vertex without intravenous contrast. COMPARISON:  04/27/2021 FINDINGS: Brain: No evidence of acute infarction, hemorrhage, hydrocephalus, extra-axial collection or mass lesion/mass effect. There is mild low-attenuation within the subcortical and periventricular white matter compatible with chronic microvascular disease. Vascular: No hyperdense vessel or unexpected calcification. Skull: Normal. Negative for fracture or focal lesion. Sinuses/Orbits: No acute finding. Other: None. IMPRESSION: 1. No acute intracranial abnormalities. 2. Chronic small vessel ischemic change. Electronically Signed   By: Signa Kell M.D.   On: 05/29/2021 21:47   CT CERVICAL SPINE WO CONTRAST  Result Date: 05/29/2021 CLINICAL DATA:  Larey Seat, neck trauma EXAM: CT CERVICAL SPINE WITHOUT CONTRAST TECHNIQUE: Multidetector CT imaging of the cervical spine was performed without intravenous  contrast. Multiplanar CT image reconstructions were also generated. COMPARISON:  04/27/2021 FINDINGS: Alignment: Alignment is anatomic. Skull base and vertebrae: No acute fracture. No primary bone lesion or focal pathologic process. Soft tissues and spinal canal: No prevertebral fluid or swelling. No visible canal hematoma. Disc levels: Mild spondylosis from C5-6 through C7-T1. Prominent facet hypertrophy at C4-5. Upper chest: Airway is patent.  Lung apices are clear. Other: Reconstructed images demonstrate no additional findings. IMPRESSION: 1. No acute cervical spine fracture. Stable multilevel spondylosis and facet hypertrophy. Electronically Signed   By: Sharlet Salina M.D.   On: 05/29/2021 21:51   DG Pelvis Portable  Result Date: 05/29/2021 CLINICAL DATA:  Larey Seat, anticoagulated EXAM: PORTABLE PELVIS 1-2 VIEWS COMPARISON:  12/28/2020 FINDINGS: Single frontal view of the pelvis demonstrates severe bilateral hip osteoarthritis, left greater than right. No acute displaced fracture. Sacroiliac joints are unremarkable. Soft tissues are normal. IMPRESSION: 1. Severe bilateral hip osteoarthritis.  No displaced fractures. Electronically Signed   By: Sharlet Salina M.D.   On: 05/29/2021 21:37   DG Chest Port 1 View  Result Date: 05/29/2021 CLINICAL DATA:  Larey Seat, anticoagulated EXAM: PORTABLE CHEST 1 VIEW COMPARISON:  None. FINDINGS: Single frontal view of the chest was obtained with the patient rotated toward the right. The cardiac silhouette is unremarkable. No airspace disease, effusion, or pneumothorax. No acute fracture. Stable splenic calcification. IMPRESSION: 1. No acute intrathoracic process. Electronically Signed   By: Sharlet Salina M.D.   On: 05/29/2021 21:37    Procedures Procedures   Medications Ordered in ED Medications  acetaminophen (TYLENOL) tablet 1,000 mg (has no administration in time range)  acetaminophen (TYLENOL) tablet 650 mg (has no administration in time range)  ondansetron  (ZOFRAN) tablet 4 mg (has no administration in time range)  alum & mag hydroxide-simeth (MAALOX/MYLANTA) 200-200-20 MG/5ML suspension 30 mL (has no administration in time range)    ED Course  I have reviewed the triage vital signs and the nursing notes.  Pertinent labs & imaging results that were available during my care of the patient were reviewed by me and considered in my medical decision making (see chart for details).    MDM Rules/Calculators/A&P                           70 year old female comes in w/ chief complaint of fall. She  lives by herself.  Allegedly she was quite independent until earlier this summer.  In the last 2 days she has had couple of falls.  She was unable to get up after the fall until EMS arrived.  Currently she is having some musculoskeletal pain from the fall itself.  No gross deformities appreciated.  CT head, C-spine along with radiographs ordered from trauma perspective.  Basic labs were ordered which are reassuring.  We discussed the patient's situation with her son and niece.  It does not appear that it is safe for patient to go home where she lives by herself.  She does not have the social support system at home yet.  Family is of the opinion that patient should probably be placed in a rehab facility.  I repeated a neurologic exam.  Patient's gross sensory exam is normal.  She has lower extremity weakness, but it is secondary to pain from her arthritis.  She also had some right upper extremity pain and was unable to participate in strength assessment.  Family does not see anything different.  There is no history of stroke.  It does not appear that there is any acute neurologic issue.  Niece reports that patient has had some falls ever since she was discharged from the rehab few weeks back.  I do not think an acute neurologic event has occurred.  Patient is cleared medically.  We will put in a consult for social work and PT OT.   Final Clinical  Impression(s) / ED Diagnoses Final diagnoses:  Trauma  Fall, initial encounter    Rx / DC Orders ED Discharge Orders     None        Derwood Kaplan, MD 05/29/21 2355

## 2021-05-29 NOTE — ED Notes (Signed)
Veronica Baker, son, [phone] would like to speak with nurse/doctor if possible

## 2021-05-29 NOTE — ED Triage Notes (Signed)
Pt BIB [facility] from home, pt had a fall today, +blood thinners, swelling to the forehead, GCS 15. Family reports pt has had slurred speech x 1 month, baseline difficulty walking. BLE swelling.

## 2021-05-29 NOTE — ED Notes (Signed)
Patient transported to CT with TRN.  

## 2021-05-29 NOTE — Progress Notes (Signed)
Chaplain Medinas-Lockley responding to page for trauma level 2 for pt Veronica Baker. Pt unavailable at time of visit.   Chaplain remains available for follow-up spiritual/emotional support as needed.  Ardath Sax, MDiv     05/29/21 2200  Clinical Encounter Type  Visited With Patient not available  Visit Type Initial;ED;Trauma

## 2021-05-30 ENCOUNTER — Emergency Department (HOSPITAL_BASED_OUTPATIENT_CLINIC_OR_DEPARTMENT_OTHER)
Admit: 2021-05-30 | Discharge: 2021-05-30 | Disposition: A | Discharge status: Home / Self Care | Payer: Medicare Other | Attending: Emergency Medicine | Admitting: Emergency Medicine

## 2021-05-30 DIAGNOSIS — M7989 Other specified soft tissue disorders: Secondary | ICD-10-CM | POA: Diagnosis not present

## 2021-05-30 DIAGNOSIS — Z86718 Personal history of other venous thrombosis and embolism: Secondary | ICD-10-CM | POA: Diagnosis not present

## 2021-05-30 DIAGNOSIS — M79605 Pain in left leg: Secondary | ICD-10-CM

## 2021-05-30 LAB — RESP PANEL BY RT-PCR (FLU A&B, COVID) ARPGX2
Influenza A by PCR: NEGATIVE
Influenza B by PCR: NEGATIVE
SARS Coronavirus 2 by RT PCR: NEGATIVE

## 2021-05-30 MED ORDER — LISINOPRIL 10 MG PO TABS
10.0000 mg | ORAL_TABLET | Freq: Every day | ORAL | Status: DC
  Administered 2021-05-30 – 2021-06-02 (×4): 10 mg via ORAL
  Filled 2021-05-30 (×4): qty 1

## 2021-05-30 MED ORDER — LORATADINE 10 MG PO TABS
10.0000 mg | ORAL_TABLET | Freq: Every day | ORAL | Status: DC
  Administered 2021-05-30 – 2021-06-04 (×6): 10 mg via ORAL
  Filled 2021-05-30 (×6): qty 1

## 2021-05-30 MED ORDER — GABAPENTIN 100 MG PO CAPS
100.0000 mg | ORAL_CAPSULE | Freq: Every morning | ORAL | Status: DC
  Administered 2021-05-30 – 2021-06-04 (×5): 100 mg via ORAL
  Filled 2021-05-30 (×5): qty 1

## 2021-05-30 MED ORDER — ROSUVASTATIN CALCIUM 20 MG PO TABS
20.0000 mg | ORAL_TABLET | Freq: Every day | ORAL | Status: DC
  Administered 2021-05-30 – 2021-06-04 (×6): 20 mg via ORAL
  Filled 2021-05-30 (×7): qty 1

## 2021-05-30 MED ORDER — GABAPENTIN 100 MG PO CAPS
200.0000 mg | ORAL_CAPSULE | Freq: Every day | ORAL | Status: DC
  Administered 2021-05-30 – 2021-06-04 (×6): 200 mg via ORAL
  Filled 2021-05-30 (×7): qty 2

## 2021-05-30 MED ORDER — APIXABAN 5 MG PO TABS
5.0000 mg | ORAL_TABLET | Freq: Two times a day (BID) | ORAL | Status: DC
  Administered 2021-05-30 – 2021-06-04 (×11): 5 mg via ORAL
  Filled 2021-05-30 (×12): qty 1

## 2021-05-30 MED ORDER — GABAPENTIN 100 MG PO CAPS: 100.0000 mg | ORAL_CAPSULE | ORAL | Status: DC

## 2021-05-30 MED ORDER — CITALOPRAM HYDROBROMIDE 20 MG PO TABS
20.0000 mg | ORAL_TABLET | Freq: Every morning | ORAL | Status: DC
  Administered 2021-05-30 – 2021-06-04 (×5): 20 mg via ORAL
  Filled 2021-05-30: qty 1
  Filled 2021-05-30 (×4): qty 2

## 2021-05-30 MED ORDER — ADULT MULTIVITAMIN W/MINERALS CH
1.0000 | ORAL_TABLET | Freq: Every day | ORAL | Status: DC
  Administered 2021-05-30 – 2021-06-04 (×6): 1 via ORAL
  Filled 2021-05-30 (×6): qty 1

## 2021-05-30 MED ORDER — AMLODIPINE BESYLATE 5 MG PO TABS
2.5000 mg | ORAL_TABLET | Freq: Every morning | ORAL | Status: DC
  Administered 2021-05-30 – 2021-06-02 (×4): 2.5 mg via ORAL
  Filled 2021-05-30 (×4): qty 1

## 2021-05-30 MED ORDER — NABUMETONE 500 MG PO TABS
500.0000 mg | ORAL_TABLET | Freq: Every day | ORAL | Status: DC
  Administered 2021-05-30: 500 mg via ORAL
  Filled 2021-05-30: qty 1

## 2021-05-30 MED ORDER — VITAMIN B-12 100 MCG PO TABS
100.0000 ug | ORAL_TABLET | Freq: Every day | ORAL | Status: DC
  Administered 2021-05-31 – 2021-06-04 (×5): 100 ug via ORAL
  Filled 2021-05-30 (×6): qty 1

## 2021-05-30 MED ORDER — FUROSEMIDE 20 MG PO TABS
20.0000 mg | ORAL_TABLET | Freq: Every day | ORAL | Status: DC
  Administered 2021-05-30 – 2021-06-02 (×4): 20 mg via ORAL
  Filled 2021-05-30 (×4): qty 1

## 2021-05-30 MED ORDER — FERROUS SULFATE 325 (65 FE) MG PO TABS
325.0000 mg | ORAL_TABLET | Freq: Every day | ORAL | Status: DC
  Administered 2021-05-30 – 2021-06-04 (×6): 325 mg via ORAL
  Filled 2021-05-30 (×6): qty 1

## 2021-05-30 MED ORDER — ALBUTEROL SULFATE (2.5 MG/3ML) 0.083% IN NEBU: 2.5000 mg | INHALATION_SOLUTION | Freq: Four times a day (QID) | RESPIRATORY_TRACT | Status: DC | PRN

## 2021-05-30 MED ORDER — FLUTICASONE FUROATE-VILANTEROL 200-25 MCG/INH IN AEPB
1.0000 | INHALATION_SPRAY | Freq: Every day | RESPIRATORY_TRACT | Status: DC
  Administered 2021-05-31 – 2021-06-02 (×3): 1 via RESPIRATORY_TRACT
  Filled 2021-05-30: qty 28

## 2021-05-30 MED ORDER — ALBUTEROL SULFATE HFA 108 (90 BASE) MCG/ACT IN AERS: 2.0000 | INHALATION_SPRAY | Freq: Four times a day (QID) | RESPIRATORY_TRACT | Status: DC | PRN

## 2021-05-30 MED ORDER — LATANOPROST 0.005 % OP SOLN
1.0000 [drp] | Freq: Every day | OPHTHALMIC | Status: DC
  Administered 2021-05-31 – 2021-06-03 (×4): 1 [drp] via OPHTHALMIC
  Filled 2021-05-30 (×2): qty 2.5

## 2021-05-30 MED ORDER — CHLORTHALIDONE 25 MG PO TABS
25.0000 mg | ORAL_TABLET | Freq: Every day | ORAL | Status: DC
  Administered 2021-05-30 – 2021-06-02 (×4): 25 mg via ORAL
  Filled 2021-05-30 (×7): qty 1

## 2021-05-30 NOTE — Progress Notes (Signed)
Referrals faxed out. Pt and family prefer [place] locations.  Auth initiated via [employer].

## 2021-05-30 NOTE — TOC CAGE-AID Note (Signed)
Transition of Care [facility]) - CAGE-AID Screening   Patient Details  Name: Veronica Baker MRN: [id number] Date of Birth: October 17, 1950  Transition of [facility]) CM/SW Contact:    Ayannah Faddis C Tarpley-Carter, LCSWA Phone Number: 05/30/2021, 2:46 PM   Clinical Narrative: Pt participated in Cage-Aid.  Pt stated she does not use substance or ETOH.  Pt was not offered resources, due to no usage of substance or ETOH.     Bram Hottel Tarpley-Carter, MSW, LCSW-A Pronouns:  She/Her/Hers [facility] Clinical Social Worker Direct Number:  [phone] [email or web address]  CAGE-AID Screening:    Have You Ever Felt You Ought to Cut Down on Your Drinking or Drug Use?: No Have People Annoyed You By [employer] Your Drinking Or Drug Use?: No Have You Felt Bad Or Guilty About Your Drinking Or Drug Use?: No Have You Ever Had a Drink or Used Drugs First Thing In The Morning to Steady Your Nerves or to Get Rid of a Hangover?: No CAGE-AID Score: 0  Substance Abuse Education Offered: No

## 2021-05-30 NOTE — ED Notes (Signed)
Tray arrived 

## 2021-05-30 NOTE — Progress Notes (Signed)
PASRR has gone to Level II.  All requested documentation has been uploaded to [facility] Must website.

## 2021-05-30 NOTE — NC FL2 (Signed)
[place] MEDICAID FL2 LEVEL OF CARE SCREENING TOOL     IDENTIFICATION  Patient Name: Veronica Baker Birthdate: Apr 01, 1951 Sex: female Admission Date (Current Location): 05/29/2021  [facility] and [place] Number:  Producer, television/film/video and Address:  [facility], [address] [place], [place] [address]      Provider Number: [id number]  Attending Physician Name and Address:  Default, Provider, MD  Relative Name and Phone Number:  Worthy Rancher, [phone]    Current Level of Care: Hospital Recommended Level of Care: Skilled Nursing Facility Prior Approval Number:    Date Approved/Denied:   PASRR Number: Pending  Discharge Plan: SNF    Current Diagnoses: There are no problems to display for this patient.   Orientation RESPIRATION BLADDER Height & Weight     Self, Time, Situation, Place  Normal Continent Weight: 200 lb (90.7 kg) Height:  5\' 4"  (162.6 cm)  BEHAVIORAL SYMPTOMS/MOOD NEUROLOGICAL BOWEL NUTRITION STATUS      Continent Diet (heart healthy)  AMBULATORY STATUS COMMUNICATION OF NEEDS Skin   Extensive Assist Verbally Normal                       Personal Care Assistance Level of Assistance  Bathing, Feeding, Dressing Bathing Assistance: Limited assistance Feeding assistance: Independent Dressing Assistance: Limited assistance     Functional Limitations Info  Sight, Hearing, Speech Sight Info: Adequate Hearing Info: Adequate Speech Info: Adequate    SPECIAL CARE FACTORS FREQUENCY  PT (By licensed PT), OT (By licensed OT)     PT Frequency: 5 x weekly OT Frequency: 5x weekly            Contractures Contractures Info: Not present    Additional Factors Info  Allergies Code Status Info: Full Allergies Info: (3) Tramadol, Celebrex, Penicillins           Current Medications (05/30/2021):  This is the current hospital active medication list Current Facility-Administered Medications  Medication Dose  Route Frequency Provider Last Rate Last Admin   acetaminophen (TYLENOL) tablet 650 mg  650 mg Oral Q4H PRN Nanavati, Ankit, MD       albuterol (PROVENTIL) (2.5 MG/3ML) 0.083% nebulizer solution 2.5 mg  2.5 mg Nebulization Q6H PRN 06/01/2021, MD       alum & mag hydroxide-simeth (MAALOX/MYLANTA) 200-200-20 MG/5ML suspension 30 mL  30 mL Oral Q6H PRN Nanavati, Ankit, MD       amLODipine (NORVASC) tablet 2.5 mg  2.5 mg Oral q morning Pfeiffer, 03-09-2001, MD       apixaban (ELIQUIS) tablet 5 mg  5 mg Oral BID Lebron Conners, MD       chlorthalidone (HYGROTON) tablet 25 mg  25 mg Oral Daily Pfeiffer, Marcy, MD       citalopram (CELEXA) tablet 20 mg  20 mg Oral q morning Pfeiffer, Marcy, MD       ferrous sulfate tablet 325 mg  325 mg Oral Daily Pfeiffer, Marcy, MD       fluticasone furoate-vilanterol (BREO ELLIPTA) 200-25 MCG/INH 1 puff  1 puff Inhalation Daily Pfeiffer, Marcy, MD       furosemide (LASIX) tablet 20 mg  20 mg Oral Daily Pfeiffer, Marcy, MD       gabapentin (NEURONTIN) capsule 100 mg  100 mg Oral q morning Pfeiffer, Marcy, MD       And   gabapentin (NEURONTIN) capsule 200 mg  200 mg Oral QHS Arby Barrette, MD  latanoprost (XALATAN) 0.005 % ophthalmic solution 1 drop  1 drop Both Eyes QHS Pfeiffer, Marcy, MD       lisinopril (ZESTRIL) tablet 10 mg  10 mg Oral Daily Pfeiffer, Lebron Conners, MD       loratadine (CLARITIN) tablet 10 mg  10 mg Oral Daily Pfeiffer, Lebron Conners, MD       multivitamin with minerals tablet 1 tablet  1 tablet Oral Daily Pfeiffer, Marcy, MD       ondansetron (ZOFRAN) tablet 4 mg  4 mg Oral Q8H PRN Rhunette Croft, Ankit, MD       rosuvastatin (CRESTOR) tablet 20 mg  20 mg Oral QHS Arby Barrette, MD       vitamin B-12 (CYANOCOBALAMIN) tablet 100 mcg  100 mcg Oral Daily Arby Barrette, MD       Current Outpatient Medications  Medication Sig Dispense Refill   albuterol (VENTOLIN HFA) 108 (90 Base) MCG/ACT inhaler Inhale 2 puffs into the lungs every 6 (six) hours as needed  for wheezing or shortness of breath.     amLODipine (NORVASC) 5 MG tablet Take 2.5 mg by mouth every morning.     chlorthalidone (HYGROTON) 25 MG tablet Take 25 mg by mouth daily.     citalopram (CELEXA) 20 MG tablet Take 20 mg by mouth every morning.     Cyanocobalamin (B-12) 1000 MCG SUBL Take 1 tablet by mouth daily.     ELIQUIS 5 MG TABS tablet Take 5 mg by mouth 2 (two) times daily.     ferrous sulfate 325 (65 FE) MG tablet Take 325 mg by mouth daily.     furosemide (LASIX) 20 MG tablet Take 20 mg by mouth daily.     gabapentin (NEURONTIN) 100 MG capsule Take 100-200 mg by mouth See admin instructions. 100 mg in the morning and afternoon 200 mg at bedtime     latanoprost (XALATAN) 0.005 % ophthalmic solution Place 1 drop into both eyes at bedtime.     lisinopril (ZESTRIL) 10 MG tablet Take 10 mg by mouth daily.     loratadine (CLARITIN) 10 MG tablet Take 10 mg by mouth daily.     Multiple Vitamins-Minerals (CVS ONE DAILY ESSENTIAL) TABS Take 1 tablet by mouth daily.     nabumetone (RELAFEN) 500 MG tablet Take 500 mg by mouth every morning.     rosuvastatin (CRESTOR) 20 MG tablet Take 20 mg by mouth at bedtime.     WIXELA INHUB 250-50 MCG/ACT AEPB Inhale 1 puff into the lungs 2 (two) times daily.       Discharge Medications: Please see discharge summary for a list of discharge medications.  Relevant Imaging Results:  Relevant Lab Results:   Additional Information SSN# [id number]  Shella Spearing, LCSW

## 2021-05-30 NOTE — Discharge Planning (Signed)
RNCM consulted regarding safe discharge planning (Home with Home Health vs Skilled Nursing Facility Placement).  Physical Therapy evaluation placed; will follow up after recommendations from PT.     

## 2021-05-30 NOTE — ED Provider Notes (Signed)
8:38 AM Patient is awaiting TOC/social work to discuss placement as the previous team felt she was not safe for discharge home.  I was given a note by nursing that the patient's oncologist who she saw yesterday was concerned for bilateral DVT and had ultrasound ordered.    As patient is awaiting possible placement and evaluation, will order bilateral DVT ultrasounds.  9:31 AM Patient was negative for DVTs in both legs as ordered by oncology team.  Patient still awaiting further recommendations by social work and TOC.     Keanon Bevins, Canary Brim, MD 05/30/21 [phone]

## 2021-05-30 NOTE — Evaluation (Signed)
Physical Therapy Evaluation Patient Details Name: Veronica Baker MRN: [id number] DOB: 27-Nov-1950 Today's Date: 05/30/2021  History of Present Illness  Pt is a 70 y/o female presenting to the ED 9/29 secondary to multiple falls. PMH includes DVT and HTN.  Clinical Impression  Pt admitted secondary to problem above with deficits below. Pt requiring mod to max A for bed mobility tasks. Pt with R lateral lean in sitting requiring up to mod A to maintain. Unsafe to attempt further mobility with +1 assist from higher stretcher height. Per family, pt was previously mod I with transfers and mobility with WC. Has since become weaker and had multiple falls from [facility]. Feel she would benefit from SNF level therapies at d/c. Will continue to follow acutely.        Recommendations for follow up therapy are one component of a multi-disciplinary discharge planning process, led by the attending physician.  Recommendations may be updated based on patient status, additional functional criteria and insurance authorization.  Follow Up Recommendations SNF;Supervision/Assistance - 24 hour    Equipment Recommendations  None recommended by PT    Recommendations for Other Services       Precautions / Restrictions Precautions Precautions: Fall Precaution Comments: Multiple falls a thome Restrictions Weight Bearing Restrictions: No      Mobility  Bed Mobility Overal bed mobility: Needs Assistance Bed Mobility: Supine to Sit;Sit to Supine;Rolling Rolling: Mod assist   Supine to sit: Mod assist;Max assist Sit to supine: Mod assist;Max assist   General bed mobility comments: mod to max A for trunk and LE assist throughout. Pt with R lateral lean and requiring up to mod A for support. Unsafe to attempt further mobility with +1 assist from higher stretcher height. Mod A for rolling side to side for brief and linen change at end of session    Transfers                    Ambulation/Gait                 Stairs            Wheelchair Mobility    Modified Rankin (Stroke Patients Only)       Balance Overall balance assessment: Needs assistance Sitting-balance support: Bilateral upper extremity supported;Feet supported Sitting balance-Leahy Scale: Poor Sitting balance - Comments: R lateral lean in sitting requiring up to mod A for stability.                                     Pertinent Vitals/Pain Pain Assessment: Faces Faces Pain Scale: Hurts even more Pain Location: BLE Pain Descriptors / Indicators: Guarding;Grimacing Pain Intervention(s): Limited activity within patient's tolerance;Monitored during session;Repositioned    Home Living Family/patient expects to be discharged to:: Private residence Living Arrangements: Alone Available Help at Discharge: Family;Available PRN/intermittently Type of Home: Apartment Home Access: Level entry     Home Layout: One level Home Equipment: Wheelchair - manual;Tub bench      Prior Function Level of Independence: Independent with assistive device(s)         Comments: Prior to becoming weak, pt was independent with transfers to [facility] and independent with ADLs.     Hand Dominance        Extremity/Trunk Assessment   Upper Extremity Assessment Upper Extremity Assessment: Generalized weakness    Lower Extremity Assessment Lower Extremity Assessment: Generalized weakness (BLE pain which limited  ROM)    Cervical / Trunk Assessment Cervical / Trunk Assessment: Kyphotic  Communication   Communication: No difficulties  Cognition Arousal/Alertness: Awake/alert Behavior During Therapy: WFL for tasks assessed/performed Overall Cognitive Status: Impaired/Different from baseline Area of Impairment: Problem solving                             Problem Solving: Slow processing        General Comments      Exercises     Assessment/Plan    PT Assessment Patient needs continued PT  services  PT Problem List Decreased strength;Decreased range of motion;Decreased activity tolerance;Decreased mobility;Decreased balance;Decreased knowledge of use of DME;Decreased knowledge of precautions;Decreased safety awareness;Pain       PT Treatment Interventions DME instruction;Functional mobility training;Therapeutic activities;Therapeutic exercise;Balance training;Neuromuscular re-education;Patient/family education;Wheelchair mobility training    PT Goals (Current goals can be found in the Care Plan section)  Acute Rehab PT Goals Patient Stated Goal: to decrease pain PT Goal Formulation: With patient Time For Goal Achievement: 06/13/21 Potential to Achieve Goals: Good    Frequency Min 2X/week   Barriers to discharge Decreased caregiver support      Co-evaluation               AM-PAC PT "6 Clicks" Mobility  Outcome Measure Help needed turning from your back to your side while in a flat bed without using bedrails?: A Lot Help needed moving from lying on your back to sitting on the side of a flat bed without using bedrails?: A Lot Help needed moving to and from a bed to a chair (including a wheelchair)?: A Lot Help needed standing up from a chair using your arms (e.g., wheelchair or bedside chair)?: Total Help needed to walk in hospital room?: Total Help needed climbing 3-5 steps with a railing? : Total 6 Click Score: 9    End of Session Equipment Utilized During Treatment: Gait belt Activity Tolerance: Patient limited by pain Patient left: in bed;with call bell/phone within reach;with family/visitor present (on stretcher in ED) Nurse Communication: Mobility status PT Visit Diagnosis: Unsteadiness on feet (R26.81);Muscle weakness (generalized) (M62.81);History of falling (Z91.81);Difficulty in walking, not elsewhere classified (R26.2)    Time: 0940-1001 PT Time Calculation (min) (ACUTE ONLY): 21 min   Charges:   PT Evaluation $PT Eval Moderate Complexity: 1  Mod          Farley Ly, PT, DPT  Acute Rehabilitation Services  Pager: [phone] Office: [phone]   Lehman Prom 05/30/2021, 10:18 AM

## 2021-05-30 NOTE — Progress Notes (Signed)
Lower extremity venous bilateral study completed.  Preliminary results relayed to Tegeler, MD.  See CV Proc for preliminary results report.   Stclair Szymborski, RDMS, RVT  

## 2021-05-31 NOTE — TOC Progression Note (Signed)
Transition of Care [facility]) - Progression Note    Patient Details  Name: Veronica Baker MRN: [id number] Date of Birth: 03-Sep-1950  [facility] CM/SW Contact  Windle Guard, [place] Phone Number: 05/31/2021, 12:22 PM  Clinical Narrative:    CSW seeking SNF placement per PT/OT recommendations. At this time patient has no bed offers.    Expected Discharge Plan: Skilled Nursing Facility Barriers to Discharge: No Barriers Identified  Expected Discharge Plan and Services Expected Discharge Plan: Skilled Nursing Facility       Living arrangements for the past 2 months: Apartment                                       Social Determinants of Health (SDOH) Interventions    Readmission Risk Interventions No flowsheet data found.

## 2021-05-31 NOTE — ED Notes (Signed)
Breakfast Ordered 

## 2021-05-31 NOTE — ED Notes (Signed)
Patient is resting comfortably respirations even and unlabored. No acute changes noted. Family at bedside. Will continue to monitor.

## 2021-05-31 NOTE — ED Notes (Signed)
Pt resting comfortably on stretcher, respirations even and unlabored. Pt denies any complaints. Purewick in place. No acute changes noted. Family at bedside. Will continue to monitor.

## 2021-06-01 NOTE — ED Notes (Signed)
Pt brief and bedding changed. Reposition in bed  Pt denies pain at this time.  Does not appear in distress Family remains at bedside

## 2021-06-01 NOTE — ED Notes (Signed)
Dinner tray ordered.

## 2021-06-01 NOTE — TOC Progression Note (Signed)
Transition of Care [facility]) - Progression Note    Patient Details  Name: Veronica Baker MRN: [id number] Date of Birth: Sep 07, 1950  [facility]) CM/SW Contact  Windle Guard, [place] Phone Number: 06/01/2021, 11:10 AM  Clinical Narrative:    CSW received bed offer from [facility] CSW left HIPPA-compliant voicemail to follow-up on SNF placement.    Expected Discharge Plan: Skilled Nursing Facility Barriers to Discharge: No Barriers Identified  Expected Discharge Plan and Services Expected Discharge Plan: Skilled Nursing Facility       Living arrangements for the past 2 months: Apartment                                       Social Determinants of Health (SDOH) Interventions    Readmission Risk Interventions No flowsheet data found.

## 2021-06-01 NOTE — ED Notes (Signed)
Placed Breakfast order 

## 2021-06-01 NOTE — ED Notes (Signed)
I introduced myself to pt. Pt AxO x4, but a little difficult to understand. Niece at bedside. Purewick placed. Pt denies pain. Pt denies further needs. Call light at bedside and side rails up.

## 2021-06-01 NOTE — ED Notes (Signed)
Pt brief changed and peri care provided.

## 2021-06-02 LAB — RESP PANEL BY RT-PCR (FLU A&B, COVID) ARPGX2
Influenza A by PCR: NEGATIVE
Influenza B by PCR: NEGATIVE
SARS Coronavirus 2 by RT PCR: NEGATIVE

## 2021-06-02 NOTE — ED Notes (Signed)
Report given to Mina, RN 

## 2021-06-02 NOTE — ED Notes (Signed)
Pt continues to sleep. NAD noted. Family remains at bedside.

## 2021-06-02 NOTE — ED Notes (Signed)
Md notified of bp.  

## 2021-06-02 NOTE — ED Notes (Signed)
Pt appears to be asleep. NAD noted. Niece remains at bedside.
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
## 2021-06-02 NOTE — Progress Notes (Signed)
Patient has 2 potential bed offers from [facility] and [facility]. Both facilities are confirming with patients insurance to make sure she has enough days to cover her 30 day stay. [facility] will have a bed ready tomorrow if insurance coverage is accurate. Veronica Baker will contact CSW back for confirmation.

## 2021-06-02 NOTE — ED Notes (Signed)
Went to pt's room, offered to change bedding and asked if she needed or wanted to get cleaned up.  Family states that they will help her with washing.  Pt up to wheel chair with significant assistance, pt to bathroom with family, changed pt's bedding.

## 2021-06-03 DIAGNOSIS — F259 Schizoaffective disorder, unspecified: Secondary | ICD-10-CM

## 2021-06-03 DIAGNOSIS — G8929 Other chronic pain: Secondary | ICD-10-CM

## 2021-06-03 DIAGNOSIS — N179 Acute kidney failure, unspecified: Secondary | ICD-10-CM

## 2021-06-03 DIAGNOSIS — F319 Bipolar disorder, unspecified: Secondary | ICD-10-CM

## 2021-06-03 DIAGNOSIS — E861 Hypovolemia: Secondary | ICD-10-CM

## 2021-06-03 DIAGNOSIS — I10 Essential (primary) hypertension: Secondary | ICD-10-CM

## 2021-06-03 DIAGNOSIS — M545 Low back pain, unspecified: Secondary | ICD-10-CM

## 2021-06-03 DIAGNOSIS — Z86718 Personal history of other venous thrombosis and embolism: Secondary | ICD-10-CM | POA: Diagnosis not present

## 2021-06-03 DIAGNOSIS — I9589 Other hypotension: Principal | ICD-10-CM

## 2021-06-03 DIAGNOSIS — Z880 Allergy status to penicillin: Secondary | ICD-10-CM | POA: Diagnosis not present

## 2021-06-03 DIAGNOSIS — Z886 Allergy status to analgesic agent status: Secondary | ICD-10-CM | POA: Diagnosis not present

## 2021-06-03 DIAGNOSIS — E669 Obesity, unspecified: Secondary | ICD-10-CM | POA: Diagnosis present

## 2021-06-03 DIAGNOSIS — Z20822 Contact with and (suspected) exposure to covid-19: Secondary | ICD-10-CM | POA: Diagnosis present

## 2021-06-03 DIAGNOSIS — I5032 Chronic diastolic (congestive) heart failure: Secondary | ICD-10-CM | POA: Diagnosis present

## 2021-06-03 DIAGNOSIS — Z888 Allergy status to other drugs, medicaments and biological substances status: Secondary | ICD-10-CM | POA: Diagnosis not present

## 2021-06-03 DIAGNOSIS — Z6834 Body mass index (BMI) 34.0-34.9, adult: Secondary | ICD-10-CM | POA: Diagnosis not present

## 2021-06-03 DIAGNOSIS — E860 Dehydration: Secondary | ICD-10-CM | POA: Diagnosis present

## 2021-06-03 DIAGNOSIS — I110 Hypertensive heart disease with heart failure: Secondary | ICD-10-CM | POA: Diagnosis present

## 2021-06-03 DIAGNOSIS — G629 Polyneuropathy, unspecified: Secondary | ICD-10-CM | POA: Diagnosis present

## 2021-06-03 DIAGNOSIS — E785 Hyperlipidemia, unspecified: Secondary | ICD-10-CM | POA: Diagnosis present

## 2021-06-03 DIAGNOSIS — I959 Hypotension, unspecified: Secondary | ICD-10-CM

## 2021-06-03 DIAGNOSIS — D869 Sarcoidosis, unspecified: Secondary | ICD-10-CM | POA: Diagnosis present

## 2021-06-03 LAB — CBC WITH DIFFERENTIAL/PLATELET
Abs Immature Granulocytes: 0.02 10*3/uL (ref 0.00–0.07)
Basophils Absolute: 0 10*3/uL (ref 0.0–0.1)
Basophils Relative: 1 %
Eosinophils Absolute: 0.4 10*3/uL (ref 0.0–0.5)
Eosinophils Relative: 6 %
HCT: 34.2 % — ABNORMAL LOW (ref 36.0–46.0)
Hemoglobin: 10.7 g/dL — ABNORMAL LOW (ref 12.0–15.0)
Immature Granulocytes: 0 %
Lymphocytes Relative: 34 %
Lymphs Abs: 2.7 10*3/uL (ref 0.7–4.0)
MCH: 26.5 pg (ref 26.0–34.0)
MCHC: 31.3 g/dL (ref 30.0–36.0)
MCV: 84.7 fL (ref 80.0–100.0)
Monocytes Absolute: 0.9 10*3/uL (ref 0.1–1.0)
Monocytes Relative: 11 %
Neutro Abs: 3.8 10*3/uL (ref 1.7–7.7)
Neutrophils Relative %: 48 %
Platelets: 218 10*3/uL (ref 150–400)
RBC: 4.04 MIL/uL (ref 3.87–5.11)
RDW: 13.9 % (ref 11.5–15.5)
WBC: 7.9 10*3/uL (ref 4.0–10.5)
nRBC: 0 % (ref 0.0–0.2)

## 2021-06-03 LAB — URINALYSIS, ROUTINE W REFLEX MICROSCOPIC
Bilirubin Urine: NEGATIVE
Glucose, UA: NEGATIVE mg/dL
Hgb urine dipstick: NEGATIVE
Ketones, ur: NEGATIVE mg/dL
Leukocytes,Ua: NEGATIVE
Nitrite: NEGATIVE
Protein, ur: NEGATIVE mg/dL
Specific Gravity, Urine: 1.014 (ref 1.005–1.030)
pH: 6 (ref 5.0–8.0)

## 2021-06-03 LAB — COMPREHENSIVE METABOLIC PANEL
ALT: 13 U/L (ref 0–44)
AST: 18 U/L (ref 15–41)
Albumin: 2.5 g/dL — ABNORMAL LOW (ref 3.5–5.0)
Alkaline Phosphatase: 72 U/L (ref 38–126)
Anion gap: 7 (ref 5–15)
BUN: 13 mg/dL (ref 8–23)
CO2: 30 mmol/L (ref 22–32)
Calcium: 9.1 mg/dL (ref 8.9–10.3)
Chloride: 100 mmol/L (ref 98–111)
Creatinine, Ser: 1.01 mg/dL — ABNORMAL HIGH (ref 0.44–1.00)
GFR, Estimated: >60 mL/min (ref >60)
Glucose, Bld: 139 mg/dL — ABNORMAL HIGH (ref 70–99)
Potassium: 3.8 mmol/L (ref 3.5–5.1)
Sodium: 137 mmol/L (ref 135–145)
Total Bilirubin: 0.5 mg/dL (ref 0.3–1.2)
Total Protein: 6.2 g/dL — ABNORMAL LOW (ref 6.5–8.1)

## 2021-06-03 LAB — BASIC METABOLIC PANEL
Anion gap: 8 (ref 5–15)
BUN: 18 mg/dL (ref 8–23)
CO2: 31 mmol/L (ref 22–32)
Calcium: 9.6 mg/dL (ref 8.9–10.3)
Chloride: 94 mmol/L — ABNORMAL LOW (ref 98–111)
Creatinine, Ser: 1.47 mg/dL — ABNORMAL HIGH (ref 0.44–1.00)
GFR, Estimated: 39 mL/min — ABNORMAL LOW (ref >60)
Glucose, Bld: 114 mg/dL — ABNORMAL HIGH (ref 70–99)
Potassium: 3.6 mmol/L (ref 3.5–5.1)
Sodium: 133 mmol/L — ABNORMAL LOW (ref 135–145)

## 2021-06-03 LAB — HIV ANTIBODY (ROUTINE TESTING W REFLEX): HIV Screen 4th Generation wRfx: NONREACTIVE

## 2021-06-03 LAB — HEPATIC FUNCTION PANEL
ALT: 14 U/L (ref 0–44)
AST: 19 U/L (ref 15–41)
Albumin: 2.8 g/dL — ABNORMAL LOW (ref 3.5–5.0)
Alkaline Phosphatase: 72 U/L (ref 38–126)
Bilirubin, Direct: <0.1 mg/dL (ref 0.0–0.2)
Total Bilirubin: 0.5 mg/dL (ref 0.3–1.2)
Total Protein: 6.9 g/dL (ref 6.5–8.1)

## 2021-06-03 LAB — TSH: TSH: 2.245 u[IU]/mL (ref 0.350–4.500)

## 2021-06-03 LAB — CK: Total CK: 73 U/L (ref 38–234)

## 2021-06-03 LAB — VITAMIN D 25 HYDROXY (VIT D DEFICIENCY, FRACTURES): Vit D, 25-Hydroxy: 32.93 ng/mL (ref 30–100)

## 2021-06-03 MED ORDER — SODIUM CHLORIDE 0.9 % IV BOLUS
1000.0000 mL | Freq: Once | INTRAVENOUS | Status: AC
  Administered 2021-06-03: 1000 mL via INTRAVENOUS

## 2021-06-03 MED ORDER — IPRATROPIUM-ALBUTEROL 0.5-2.5 (3) MG/3ML IN SOLN: 3.0000 mL | RESPIRATORY_TRACT | Status: DC | PRN

## 2021-06-03 MED ORDER — METOPROLOL TARTRATE 5 MG/5ML IV SOLN: 5.0000 mg | INTRAVENOUS | Status: DC | PRN

## 2021-06-03 MED ORDER — SODIUM CHLORIDE 0.9 % IV SOLN: 250.0000 mL | INTRAVENOUS | Status: DC | PRN

## 2021-06-03 MED ORDER — SODIUM CHLORIDE 0.9% FLUSH
3.0000 mL | Freq: Two times a day (BID) | INTRAVENOUS | Status: DC
  Administered 2021-06-04: 3 mL via INTRAVENOUS

## 2021-06-03 MED ORDER — NABUMETONE 500 MG PO TABS
500.0000 mg | ORAL_TABLET | Freq: Every morning | ORAL | Status: DC
  Filled 2021-06-03: qty 1

## 2021-06-03 MED ORDER — SODIUM CHLORIDE 0.9% FLUSH: 3.0000 mL | INTRAVENOUS | Status: DC | PRN

## 2021-06-03 MED ORDER — ENSURE ENLIVE PO LIQD
237.0000 mL | Freq: Three times a day (TID) | ORAL | Status: DC
  Administered 2021-06-04 (×3): 237 mL via ORAL
  Filled 2021-06-03: qty 237

## 2021-06-03 MED ORDER — TRAZODONE HCL 50 MG PO TABS: 50.0000 mg | ORAL_TABLET | Freq: Every evening | ORAL | Status: DC | PRN

## 2021-06-03 MED ORDER — LACTATED RINGERS IV SOLN: INTRAVENOUS | Status: AC

## 2021-06-03 MED ORDER — MELATONIN 3 MG PO TABS
3.0000 mg | ORAL_TABLET | Freq: Every evening | ORAL | Status: DC | PRN
  Administered 2021-06-03: 3 mg via ORAL
  Filled 2021-06-03: qty 1

## 2021-06-03 MED ORDER — SENNOSIDES-DOCUSATE SODIUM 8.6-50 MG PO TABS
2.0000 | ORAL_TABLET | Freq: Two times a day (BID) | ORAL | Status: DC | PRN
  Administered 2021-06-03: 2 via ORAL
  Filled 2021-06-03 (×2): qty 2

## 2021-06-03 MED ORDER — SENNOSIDES-DOCUSATE SODIUM 8.6-50 MG PO TABS
1.0000 | ORAL_TABLET | Freq: Every evening | ORAL | Status: DC | PRN
Start: 2021-06-03 — End: 2021-06-03

## 2021-06-03 MED ORDER — OXYCODONE HCL 5 MG PO TABS
5.0000 mg | ORAL_TABLET | ORAL | Status: DC | PRN
Start: 2021-06-03 — End: 2021-06-05
  Administered 2021-06-03 – 2021-06-04 (×4): 5 mg via ORAL
  Filled 2021-06-03 (×4): qty 1

## 2021-06-03 MED ORDER — HYDRALAZINE HCL 20 MG/ML IJ SOLN: 10.0000 mg | INTRAMUSCULAR | Status: DC | PRN

## 2021-06-03 NOTE — Assessment & Plan Note (Signed)
Etiology of acute kidney injury is likely multifactorial including diuretics, ACE inhibitor's and dehydration.

## 2021-06-03 NOTE — Assessment & Plan Note (Signed)
Patient has a history of bipolar disorder and schizoaffective disorder managed by outpatient psychiatry.

## 2021-06-03 NOTE — Assessment & Plan Note (Signed)
Patient has a history of bipolar disorder and schizoaffective disorder managed by outpatient psychiatry.  

## 2021-06-03 NOTE — Progress Notes (Signed)
Physical Therapy Treatment Patient Details Name: BERMA HARTS MRN: [id number] DOB: 07-14-1951 Today's Date: 06/03/2021   History of Present Illness Pt is a 70 y/o female presenting to the ED 9/29 secondary to multiple falls. PMH includes DVT and HTN.    PT Comments    Pt was seen for mobility on side of bed with assistance for balance and ROM to LE's.  Pt is in pain on LE's with skin irritation and weakness, and was not safely able to be stood from the height of the gurney.  Talked with nursing about getting a hosp bed for support of more mobility if her stay permits.  Follow along with her for acute PT goals, focusing on her need to increase WB on BLE's and to manage pain in advance of her sessions.      Recommendations for follow up therapy are one component of a multi-disciplinary discharge planning process, led by the attending physician.  Recommendations may be updated based on patient status, additional functional criteria and insurance authorization.  Follow Up Recommendations  SNF     Equipment Recommendations  None recommended by PT    Recommendations for Other Services       Precautions / Restrictions Precautions Precautions: Fall Precaution Comments: falling at home Restrictions Weight Bearing Restrictions: No     Mobility  Bed Mobility Overal bed mobility: Needs Assistance Bed Mobility: Supine to Sit;Sit to Supine     Supine to sit: Mod assist Sit to supine: Mod assist;Max assist   General bed mobility comments: max to return to bed due to pain in legs and her abiltiy to forcefully lift them    Transfers                 General transfer comment: deferred, unsafe to try  Ambulation/Gait             General Gait Details: deferred   Stairs             Wheelchair Mobility    Modified Rankin (Stroke Patients Only)       Balance Overall balance assessment: Needs assistance Sitting-balance support: Bilateral upper extremity  supported;Feet supported Sitting balance-Leahy Scale: Poor Sitting balance - Comments: leaning R purposefully due to weakness                                    Cognition Arousal/Alertness: Awake/alert;Lethargic Behavior During Therapy: WFL for tasks assessed/performed Overall Cognitive Status: Impaired/Different from baseline Area of Impairment: Safety/judgement;Problem solving                         Safety/Judgement: Decreased awareness of safety;Decreased awareness of deficits   Problem Solving: Slow processing;Requires verbal cues;Requires tactile cues General Comments: instructions for all mobility tasks      Exercises General Exercises - Lower Extremity Ankle Circles/Pumps: AAROM;5 reps Long Arc Quad: AROM;AAROM;10 reps Heel Slides: AROM;AAROM;10 reps Hip ABduction/ADduction: AROM;AAROM;10 reps Hip Flexion/Marching: AAROM;AROM;10 reps    General Comments General comments (skin integrity, edema, etc.): pt is attended by family who are encouraging but not intrusive to session      Pertinent Vitals/Pain Pain Assessment: Faces Faces Pain Scale: Hurts whole lot Pain Location: BLE Pain Descriptors / Indicators: Guarding;Grimacing Pain Intervention(s): Limited activity within patient's tolerance;Monitored during session;Repositioned;Premedicated before session    Home Living  Prior Function            PT Goals (current goals can now be found in the care plan section) Acute Rehab PT Goals Patient Stated Goal: feel stronger and legs hurt less    Frequency    Min 2X/week      PT Plan Current plan remains appropriate    Co-evaluation              AM-PAC PT "6 Clicks" Mobility   Outcome Measure  Help needed turning from your back to your side while in a flat bed without using bedrails?: A Lot Help needed moving from lying on your back to sitting on the side of a flat bed without using bedrails?: A  Lot Help needed moving to and from a bed to a chair (including a wheelchair)?: A Lot Help needed standing up from a chair using your arms (e.g., wheelchair or bedside chair)?: Total Help needed to walk in hospital room?: Total Help needed climbing 3-5 steps with a railing? : Total 6 Click Score: 9    End of Session Equipment Utilized During Treatment: Gait belt;Oxygen Activity Tolerance: Patient limited by fatigue;Patient limited by pain;Treatment limited secondary to medical complications (Comment) Patient left: in bed;with call bell/phone within reach;with family/visitor present Nurse Communication: Mobility status PT Visit Diagnosis: Unsteadiness on feet (R26.81);Muscle weakness (generalized) (M62.81);History of falling (Z91.81);Difficulty in walking, not elsewhere classified (R26.2)     Time: 0254-2706 PT Time Calculation (min) (ACUTE ONLY): 31 min  Charges:  $Therapeutic Exercise: 8-22 mins $Therapeutic Activity: 8-22 mins             Ivar Drape 06/03/2021, 6:48 PM  Samul Dada, PT PhD Acute Rehab Dept. Number: [facility] [id number] and [facility] [phone]

## 2021-06-03 NOTE — Progress Notes (Signed)
Patient accepted at [facility] for SNF.

## 2021-06-03 NOTE — Assessment & Plan Note (Signed)
To the patient is low blood pressure, will only be able to continue Tylenol for now.  Once her blood pressure improves, patient may need additional pain medications.
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
## 2021-06-03 NOTE — Assessment & Plan Note (Signed)
Due to continued hypotension and acute kidney injury, patient be admitted progressive care.  We will continue IV fluids.  Stop all diuretics.  Stop ACE inhibitor.  Avoid nonsteroidal anti-inflammatories.  Check cath UA.  Repeat chemistries in the afternoon.

## 2021-06-03 NOTE — ED Provider Notes (Signed)
  Physical Exam  BP 102/69   Pulse 92   Temp 97.6 F (36.4 C) (Oral)   Resp 15   Ht 5\' 4"  (1.626 m)   Wt 90.7 kg   SpO2 99%   BMI 34.33 kg/m   Physical Exam  ED Course/Procedures     Procedures  MDM  I was called by nursing around 2 AM for hypotension.  Upon review of record, patient has been hypotensive since yesterday afternoon.  Blood pressure went down to the 70s.  She is asleep on my exam.  Repeat blood pressure has been consistently in the 70s to 80s.  Per the daughter at bedside, patient has been confused but that is unchanged from when she first presented.  Plan to get repeat CBC and BMP and give IV fluid  5:17 AM Repeat CBC unchanged but BMP showed acute renal failure.  Patient was given 1 L normal saline bolus and blood pressure went up to the 80s.  Order second normal saline bolus.  At this point will admit for acute renal failure.  I ordered urinalysis and CK level.  Hospitalist to admit  CRITICAL CARE Performed by:   Total critical care time: 30 minutes  Critical care time was exclusive of separately billable procedures and treating other patients.  Critical care was necessary to treat or prevent imminent or life-threatening deterioration.  Critical care was time spent personally by me on the following activities: development of treatment plan with patient and/or surrogate as well as nursing, discussions with consultants, evaluation of patient's response to treatment, examination of patient, obtaining history from patient or surrogate, ordering and performing treatments and interventions, ordering and review of laboratory studies, ordering and review of radiographic studies, pulse oximetry and re-evaluation of patient's condition.       Richardean Canal, MD 06/03/21 [phone]

## 2021-06-03 NOTE — ED Notes (Signed)
Pt family notified this RN pt is not eating. Requesting Pedialyte or chocolate ensure.
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
## 2021-06-03 NOTE — ED Notes (Signed)
Family remains at the bedside.
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
## 2021-06-03 NOTE — ED Notes (Signed)
Pt bed linens changed. Peri care done. New purewik and new brief placed on pt.

## 2021-06-03 NOTE — Assessment & Plan Note (Signed)
Hold all blood pressure medicines for now due to hypotension.

## 2021-06-03 NOTE — ED Notes (Signed)
Suctions canister emptied.  of urine emptied.

## 2021-06-03 NOTE — Progress Notes (Signed)
CSW contacted patients niece twice and left a message

## 2021-06-03 NOTE — ED Notes (Signed)
Admitting MD at the bedside.  

## 2021-06-04 ENCOUNTER — Other Ambulatory Visit: Payer: Self-pay

## 2021-06-04 ENCOUNTER — Encounter (HOSPITAL_COMMUNITY): Payer: Self-pay | Admitting: Internal Medicine

## 2021-06-04 DIAGNOSIS — I9589 Other hypotension: Secondary | ICD-10-CM | POA: Diagnosis not present

## 2021-06-04 DIAGNOSIS — E861 Hypovolemia: Secondary | ICD-10-CM | POA: Diagnosis not present

## 2021-06-04 LAB — CBC WITH DIFFERENTIAL/PLATELET
Abs Immature Granulocytes: 0.01 10*3/uL (ref 0.00–0.07)
Basophils Absolute: 0 10*3/uL (ref 0.0–0.1)
Basophils Relative: 1 %
Eosinophils Absolute: 0.5 10*3/uL (ref 0.0–0.5)
Eosinophils Relative: 7 %
HCT: 31.8 % — ABNORMAL LOW (ref 36.0–46.0)
Hemoglobin: 9.9 g/dL — ABNORMAL LOW (ref 12.0–15.0)
Immature Granulocytes: 0 %
Lymphocytes Relative: 42 %
Lymphs Abs: 2.6 10*3/uL (ref 0.7–4.0)
MCH: 26.3 pg (ref 26.0–34.0)
MCHC: 31.1 g/dL (ref 30.0–36.0)
MCV: 84.6 fL (ref 80.0–100.0)
Monocytes Absolute: 0.7 10*3/uL (ref 0.1–1.0)
Monocytes Relative: 12 %
Neutro Abs: 2.3 10*3/uL (ref 1.7–7.7)
Neutrophils Relative %: 38 %
Platelets: 169 10*3/uL (ref 150–400)
RBC: 3.76 MIL/uL — ABNORMAL LOW (ref 3.87–5.11)
RDW: 13.8 % (ref 11.5–15.5)
WBC: 6.2 10*3/uL (ref 4.0–10.5)
nRBC: 0 % (ref 0.0–0.2)

## 2021-06-04 LAB — COMPREHENSIVE METABOLIC PANEL
ALT: 16 U/L (ref 0–44)
AST: 21 U/L (ref 15–41)
Albumin: 2.5 g/dL — ABNORMAL LOW (ref 3.5–5.0)
Alkaline Phosphatase: 69 U/L (ref 38–126)
Anion gap: 5 (ref 5–15)
BUN: 13 mg/dL (ref 8–23)
CO2: 31 mmol/L (ref 22–32)
Calcium: 9.2 mg/dL (ref 8.9–10.3)
Chloride: 98 mmol/L (ref 98–111)
Creatinine, Ser: 0.83 mg/dL (ref 0.44–1.00)
GFR, Estimated: >60 mL/min (ref >60)
Glucose, Bld: 111 mg/dL — ABNORMAL HIGH (ref 70–99)
Potassium: 4 mmol/L (ref 3.5–5.1)
Sodium: 134 mmol/L — ABNORMAL LOW (ref 135–145)
Total Bilirubin: 0.5 mg/dL (ref 0.3–1.2)
Total Protein: 6.3 g/dL — ABNORMAL LOW (ref 6.5–8.1)

## 2021-06-04 LAB — MAGNESIUM: Magnesium: 1.8 mg/dL (ref 1.7–2.4)

## 2021-06-04 MED ORDER — APIXABAN 5 MG PO TABS: 5.0000 mg | ORAL_TABLET | Freq: Two times a day (BID) | ORAL | 0 refills | Status: DC

## 2021-06-04 MED ORDER — SENNOSIDES-DOCUSATE SODIUM 8.6-50 MG PO TABS
2.0000 | ORAL_TABLET | Freq: Two times a day (BID) | ORAL | Status: DC | PRN
Start: 2021-06-04 — End: 2021-09-10

## 2021-06-04 MED ORDER — ONDANSETRON HCL 4 MG PO TABS: 4.0000 mg | ORAL_TABLET | Freq: Three times a day (TID) | ORAL | 0 refills | Status: DC | PRN

## 2021-06-04 NOTE — TOC Transition Note (Signed)
Transition of Care [facility]) - CM/SW Discharge Note   Patient Details  Name: Veronica Baker MRN: [id number] Date of Birth: 03/13/1951  [facility] of [facility]) CM/SW Contact:  Delilah Shan, LCSWA Phone Number: 06/04/2021, 2:21 PM   Clinical Narrative:     Patient will DC to: [facility] and Rehab  Anticipated DC date: 06/04/2021  Family notified Karren Burly   Transport by: Sharin Mons   ?  Per MD patient ready for DC to [facility] . RN, patient, patient's family, and facility notified of DC. Discharge Summary sent to facility. RN given number for report tele# [phone] RM# [phone]. DC packet on chart. Ambulance transport requested for patient.  CSW signing off.   Final next level of care: Skilled Nursing Facility Barriers to Discharge: No Barriers Identified   Patient Goals and CMS Choice Patient states their goals for this hospitalization and ongoing recovery are:: to go to SNF CMS Medicare.gov Compare Post Acute Care list provided to:: Patient Choice offered to / list presented to : Patient  Discharge Placement              Patient chooses bed at: [facility] Patient to be transferred to facility by: [facility] Name of family member notified: Karren Burly Patient and family notified of of transfer: 06/04/21  Discharge Plan and Services                                     Social Determinants of Health (SDOH) Interventions     Readmission Risk Interventions No flowsheet data found.

## 2021-06-04 NOTE — Therapy (Signed)
Occupational Therapy Evaluation Patient Details Name: Veronica Baker MRN: [id number] DOB: 11-14-1950 Today's Date: 06/04/2021   History of Present Illness Pt is a 71 y/o female presenting to the ED 9/29 secondary to multiple falls. PMH includes DVT and HTN.   Clinical Impression   Pt admitted as above currently demonstrating deficits in functional mobility/transfers, ADL's, activity tolerance and pain. Pain in bilateral LE's and headache are limiting factors. Pt was overall Mod A +2 for sit to stand and SPT to 3:1. Pt completed sit to stand x2 and then side stepped with Min-Mod A to head of bed. After getting back in bed, pt requested OOB to sit on 3:1 for BM. Pt requires increased time for all functional mobility. Pt was a Mod A +2 for safety/sequencing secondary to h/o falls, LE pain and h/o LE neuropathy. Pt required increased assistance as session progressed and she became more lethargic secondary to decreased activity tolerance. Pt was in room sitting on 3:1 bedside with family present, call bell within reach and RN staff was made aware that pt family will use call bell when pt is finished on 3:1 as she will need +2 Mod A back to bed. Pt is agreeable to SNF at d/c.     Recommendations for follow up therapy are one component of a multi-disciplinary discharge planning process, led by the attending physician.  Recommendations may be updated based on patient status, additional functional criteria and insurance authorization.   Follow Up Recommendations  SNF    Equipment Recommendations  Other (comment) (Defer to next venue)    Recommendations for Other Services       Precautions / Restrictions Precautions Precautions: Fall Precaution Comments: falling at home Restrictions Weight Bearing Restrictions: No      Mobility Bed Mobility Overal bed mobility: Needs Assistance Bed Mobility: Supine to Sit Rolling: Mod assist   Supine to sit: Mod assist;HOB elevated (Mod A for BLE and  moving off EOB to come into sitting.)     General bed mobility comments: Mod A for supine to sit and getting to EOB, increased time and vc's for safety/sequencing.    Transfers Overall transfer level: Needs assistance Equipment used: Rolling walker (2 wheeled) Transfers: Sit to/from [employer] Sit to Stand: Mod assist;From elevated surface Stand pivot transfers: Mod assist;+2 physical assistance;+2 safety/equipment       General transfer comment: Increased time for all transfers and vc's for safety and sequencing. Pt is limited by neuropathy BLE and decreased awareness of deficits w/ h/o falls. Pt niece in room for duration and was supportive/helpful/encouraging to pt.    Balance Overall balance assessment: Needs assistance Sitting-balance support: Bilateral upper extremity supported;Feet supported Sitting balance-Leahy Scale: Fair     Standing balance support: Bilateral upper extremity supported Standing balance-Leahy Scale: Poor     ADL either performed or assessed with clinical judgement   ADL Overall ADL's : Needs assistance/impaired Eating/Feeding: Set up   Grooming: Set up;Sitting   Upper Body Bathing: Set up;Sitting   Lower Body Bathing: Moderate assistance;Maximal assistance;Sitting/lateral leans   Upper Body Dressing : Minimal assistance;Sitting   Lower Body Dressing: Maximal assistance;+2 for physical assistance;+2 for safety/equipment;Sit to/from stand   Toilet Transfer: Moderate assistance;+2 for physical assistance;+2 for safety/equipment;Stand-pivot;Cueing for safety;Cueing for sequencing;Ambulation;BSC;RW;Requires wide/bariatric   Toileting- Clothing Manipulation and Hygiene: Maximal assistance;+2 for physical assistance;+2 for safety/equipment;Sit to/from stand       Functional mobility during ADLs: Moderate assistance;+2 for physical assistance;+2 for safety/equipment;Rolling walker;Cueing for safety;Cueing for sequencing General  ADL  Comments: Pt lives alone and has h/o multiple falls at home recently "4 times in one day" recently. Pt also has h/o neuropathy and pain in BLE's that is limiting factor during transfers. She is overall Min-Mod A +2 for safety and sequencing. Pt performed sit to stand x2 from EOB and side stepped to head of bed and requires increased time for all mobility secondary to pain and neuropathy noted. After getting settled in bed, pt reported that she needed to sit on 3:1 for BM. Pt was assisted to 3:1 and RN staff was made aware that pt was on 3:1. Pt/family in room in aware to use call bell when ready to be assisted back to bed.     Vision Baseline Vision/History: 1 Wears glasses              Pertinent Vitals/Pain Pain Assessment: Faces Faces Pain Scale: Hurts whole lot Pain Location: BLE and headache Pain Descriptors / Indicators: Guarding;Grimacing Pain Intervention(s): Limited activity within patient's tolerance;Monitored during session;Repositioned;Patient requesting pain meds-RN notified (RN called and assessed, will bring pain medication)     Hand Dominance Right   Extremity/Trunk Assessment Upper Extremity Assessment Upper Extremity Assessment: Generalized weakness   Lower Extremity Assessment Lower Extremity Assessment: Defer to PT evaluation   Cervical / Trunk Assessment Cervical / Trunk Assessment: Kyphotic   Communication Communication Communication: No difficulties (Soft spoken)   Cognition Arousal/Alertness: Awake/alert;Lethargic Behavior During Therapy: WFL for tasks assessed/performed Overall Cognitive Status: Impaired/Different from baseline Area of Impairment: Safety/judgement;Problem solving     Safety/Judgement: Decreased awareness of safety;Decreased awareness of deficits   Problem Solving: Slow processing;Requires verbal cues;Requires tactile cues General Comments: instructions for all mobility tasks   General Comments  pt is attended by family who are  supportive/encouraging to session (Pt's family to use call bell when pt is finished on 3:1 and ready for A back to bed from RN staff. RN stafff also made aware)            Home Living Family/patient expects to be discharged to:: Skilled nursing facility Living Arrangements: Alone Available Help at Discharge: Family;Available PRN/intermittently Type of Home: Apartment Home Access: Level entry     Home Layout: One level     Bathroom Shower/Tub: Tub/shower unit     Home Equipment: Wheelchair - manual;Tub bench;Walker - 2 wheels;Cane - single point     Prior Functioning/Environment Level of Independence: Independent with assistive device(s)        Comments: Prior to becoming weak, pt was independent with transfers to [facility] and independent with ADLs.        OT Problem List: Decreased strength;Decreased activity tolerance;Impaired balance (sitting and/or standing);Decreased knowledge of use of DME or AE;Pain (h/o falls)      OT Treatment/Interventions: Self-care/ADL training;Therapeutic exercise;DME and/or AE instruction;Therapeutic activities;Patient/family education    OT Goals(Current goals can be found in the care plan section) Acute Rehab OT Goals Patient Stated Goal: feel stronger and legs hurt less Time For Goal Achievement: 06/25/21 Potential to Achieve Goals: Good ADL Goals Pt Will Perform Grooming: with modified independence;sitting Pt Will Perform Lower Body Bathing: with supervision;sitting/lateral leans;sit to/from stand;with adaptive equipment Pt Will Perform Lower Body Dressing: with min guard assist;with adaptive equipment;sitting/lateral leans;sit to/from stand Pt Will Transfer to Toilet: with supervision;stand pivot transfer;ambulating;bedside commode Pt Will Perform Toileting - Clothing Manipulation and hygiene: with min guard assist;sitting/lateral leans;with adaptive equipment;sit to/from stand Additional ADL Goal #1: Pt will be Mod I Bilateral UE HEP to  assist in  increased endurance, activity tolerance and participation in ADL tasks.  OT Frequency: Min 2X/week   Barriers to D/C:  (Lives alone)  Pt aware of recommendation for SNF rehab and is in agreement          AM-PAC OT "6 Clicks" Daily Activity     Outcome Measure Help from another person eating meals?: A Little Help from another person taking care of personal grooming?: A Little Help from another person toileting, which includes using toliet, bedpan, or urinal?: A Lot Help from another person bathing (including washing, rinsing, drying)?: A Lot Help from another person to put on and taking off regular upper body clothing?: A Little Help from another person to put on and taking off regular lower body clothing?: Total 6 Click Score: 14   End of Session Equipment Utilized During Treatment: Gait belt;Rolling walker Nurse Communication: Mobility status;Patient requests pain meds;Other (comment) (Pt on 3:1 and pt/family will use call bell when finished on 3:1 - see note above.)  Activity Tolerance: Patient limited by lethargy;Patient limited by pain Patient left: in chair;with call bell/phone within reach;with family/visitor present;Other (comment) (Pt was assisted back to bed and then requested to sit on 3:1 for BM. Pt/family and RN staff was made aware that pt will need +2 A back to bed when completed. Pt's niece states that they will use call bell for RN staff.)  OT Visit Diagnosis: Unsteadiness on feet (R26.81);Other abnormalities of gait and mobility (R26.89);Repeated falls (R29.6);Muscle weakness (generalized) (M62.81);Pain Pain - Right/Left: Left Pain - part of body:  (BLE & headache)                Time: 0623-7628 OT Time Calculation (min): 40 min Charges:  OT General Charges $OT Visit: 1 Visit OT Evaluation $OT Eval Moderate Complexity: 1 Mod OT Treatments $Self Care/Home Management : 8-22 mins  Lilburn Straw Beth Dixon, OT/L 06/04/2021, 9:49 AM

## 2021-06-04 NOTE — Plan of Care (Signed)

## 2021-06-04 NOTE — Discharge Summary (Signed)
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  improper specimen collection/handling, submission of specimen other than nasopharyngeal swab, presence of viral mutation(s) within the areas targeted by this assay, and inadequate number of viral copies(<138 copies/mL). A negative result must be combined with clinical observations, patient history, and epidemiological information. The expected result is Negative.  Fact Sheet for Patients:  BloggerCourse.com  Fact Sheet for Healthcare Providers:   SeriousBroker.it  This test is no t yet approved or cleared by the [place] FDA and  has been authorized for detection and/or diagnosis of SARS-CoV-2 by FDA under an Emergency Use Authorization (EUA). This EUA will remain  in effect (meaning this test can be used) for the duration of the COVID-19 declaration under Section 564(b)(1) of the Act, 21 U.S.C.section 360bbb-3(b)(1), unless the authorization is terminated  or revoked sooner.       Influenza A by PCR NEGATIVE NEGATIVE Final   Influenza B by PCR NEGATIVE NEGATIVE Final    Comment: (NOTE) The Xpert Xpress SARS-CoV-2/FLU/RSV plus assay is intended as an aid in the diagnosis of influenza from Nasopharyngeal swab specimens and should not be used as a sole basis for treatment. Nasal washings and aspirates are unacceptable for Xpert Xpress SARS-CoV-2/FLU/RSV testing.  Fact Sheet for Patients: BloggerCourse.com  Fact Sheet for Healthcare Providers: SeriousBroker.it  This test is not yet approved or cleared by the [place] FDA and has been authorized for detection and/or diagnosis of SARS-CoV-2 by FDA under an Emergency Use Authorization (EUA). This EUA will remain in effect (meaning this test can be used) for the duration of the COVID-19 declaration under Section 564(b)(1) of the Act, 21 U.S.C. section 360bbb-3(b)(1), unless the authorization is terminated or revoked.  Performed at [facility], [address] [place], [place] [address]   Resp Panel by RT-PCR (Flu A&B, Covid) Nasopharyngeal Swab     Status: None   Collection Time: 06/02/21 12:04 PM   Specimen: Nasopharyngeal Swab; Nasopharyngeal(NP) swabs in vial transport medium  Result Value Ref Range Status   SARS Coronavirus 2 by RT PCR NEGATIVE NEGATIVE Final    Comment: (NOTE) SARS-CoV-2 target nucleic acids are NOT DETECTED.  The SARS-CoV-2 RNA is generally detectable in  upper respiratory specimens during the acute phase of infection. The lowest concentration of SARS-CoV-2 viral copies this assay can detect is 138 copies/mL. A negative result does not preclude SARS-Cov-2 infection and should not be used as the sole basis for treatment or other patient management decisions. A negative result may occur with  improper specimen collection/handling, submission of specimen other than nasopharyngeal swab, presence of viral mutation(s) within the areas targeted by this assay, and inadequate number of viral copies(<138 copies/mL). A negative result must be combined with clinical observations, patient history, and epidemiological information. The expected result is Negative.  Fact Sheet for Patients:  BloggerCourse.com  Fact Sheet for Healthcare Providers:  SeriousBroker.it  This test is no t yet approved or cleared by the [place] FDA and  has been authorized for detection and/or diagnosis of SARS-CoV-2 by FDA under an Emergency Use Authorization (EUA). This EUA will remain  in effect (meaning this test can be used) for the duration of the COVID-19 declaration under Section 564(b)(1) of the Act, 21 U.S.C.section 360bbb-3(b)(1), unless the authorization is terminated  or revoked sooner.       Influenza A by PCR NEGATIVE NEGATIVE Final   Influenza B by PCR NEGATIVE NEGATIVE Final    Comment: (NOTE) The Xpert Xpress SARS-CoV-2/FLU/RSV plus assay is intended as an aid in the diagnosis of influenza from Nasopharyngeal swab specimens and should not be used as a sole basis for treatment. Nasal washings and aspirates are unacceptable for Xpert Xpress SARS-CoV-2/FLU/RSV testing.  Fact Sheet for Patients: BloggerCourse.com  Fact Sheet for Healthcare Providers: SeriousBroker.it  This test is not yet  approved or cleared by the [place] and has been  authorized for detection and/or diagnosis of SARS-CoV-2 by FDA under an Emergency Use Authorization (EUA). This EUA will remain in effect (meaning this test can be used) for the duration of the COVID-19 declaration under Section 564(b)(1) of the Act, 21 U.S.C. section 360bbb-3(b)(1), unless the authorization is terminated or revoked.  Performed at [facility], [address], [place], [place] [address]      Time coordinating discharge: 38 minutes  SIGNED:   Alwyn Ren, MD  [facility] 06/04/2021, 11:00 AM

## 2021-06-04 NOTE — TOC Progression Note (Addendum)
Transition of Care [facility]) - Progression Note    Patient Details  Name: Veronica Baker MRN: [id number] Date of Birth: June 05, 1951  [facility] CM/SW Contact  Delilah Shan, LCSWA Phone Number: 06/04/2021, 1:37 PM  Clinical Narrative:     Update- CSW received insurance authorization approval for patient. [employer] ID# [id number]. Approval start date is for 10/5-10/7. Next review date is 10/7.  CSW checked on patients insurance authorization. CSW had to restart insurance authorization. Insurance authorization approval was approved for [employer] not [facility]. CSW restarted insurance authorization. Reference number is # [id number]. CSW awaiting insurance determination. CSW informed SNF.  Expected Discharge Plan: Skilled Nursing Facility Barriers to Discharge: No Barriers Identified  Expected Discharge Plan and Services Expected Discharge Plan: Skilled Nursing Facility       Living arrangements for the past 2 months: Apartment Expected Discharge Date: 06/04/21                                     Social Determinants of Health (SDOH) Interventions    Readmission Risk Interventions No flowsheet data found.

## 2021-06-04 NOTE — NC FL2 (Addendum)
[place] MEDICAID FL2 LEVEL OF CARE SCREENING TOOL     IDENTIFICATION  Patient Name: Veronica Baker Birthdate: 10/03/1950 Sex: female Admission Date (Current Location): 05/29/2021  [facility] and [place] Number:  Producer, television/film/video and Address:  [facility] [place], [address] [place], [place] [address]      Provider Number: [id number]  Attending Physician Name and Address:  Alwyn Ren, MD  Relative Name and Phone Number:  Worthy Rancher, [phone]    Current Level of Care: Hospital Recommended Level of Care: Skilled Nursing Facility Prior Approval Number:    Date Approved/Denied:   PASRR Number:  [id number] Discharge Plan: SNF    Current Diagnoses: Patient Active Problem List   Diagnosis Date Noted   Hypotension due to hypovolemia 06/03/2021   AKI (acute kidney injury) (HCC) 06/03/2021   Bipolar affective disorder (HCC) 06/03/2021   Chronic low back pain 06/03/2021   Essential hypertension 06/03/2021   Schizoaffective disorder (HCC) 06/03/2021    Orientation RESPIRATION BLADDER Height & Weight     Self, Time, Situation, Place  Normal Continent, External catheter (External Urinary Catheter) Weight: 172 lb 14.4 oz (78.4 kg) Height:  5\' 1"  (154.9 cm)  BEHAVIORAL SYMPTOMS/MOOD NEUROLOGICAL BOWEL NUTRITION STATUS      Continent Diet (heart healthy)  AMBULATORY STATUS COMMUNICATION OF NEEDS Skin   Extensive Assist Verbally Normal                       Personal Care Assistance Level of Assistance  Bathing, Feeding, Dressing Bathing Assistance: Limited assistance Feeding assistance: Independent Dressing Assistance: Limited assistance     Functional Limitations Info  Sight, Hearing, Speech Sight Info: Adequate Hearing Info: Adequate Speech Info: Adequate    SPECIAL CARE FACTORS FREQUENCY  PT (By licensed PT), OT (By licensed OT)     PT Frequency: 5 x weekly OT Frequency: 5x weekly             Contractures Contractures Info: Not present    Additional Factors Info  Allergies Code Status Info: Full Allergies Info: (3) Tramadol, Celebrex, Penicillins Psychotropic Info: citalopram (CELEXA) tablet 20 mg every morning         Current Medications (06/04/2021):  This is the current hospital active medication list Current Facility-Administered Medications  Medication Dose Route Frequency Provider Last Rate Last Admin   0.9 %  sodium chloride infusion  250 mL Intravenous PRN 08/04/2021, DO       acetaminophen (TYLENOL) tablet 650 mg  650 mg Oral Q4H PRN Carollee Herter, DO   650 mg at 06/04/21 08/04/21   apixaban (ELIQUIS) tablet 5 mg  5 mg Oral BID 6269, DO   5 mg at 06/04/21 1024   citalopram (CELEXA) tablet 20 mg  20 mg Oral q morning 08/04/21, DO   20 mg at 06/04/21 1036   feeding supplement (ENSURE ENLIVE / ENSURE PLUS) liquid 237 mL  237 mL Oral TID BM Amin, Ankit Chirag, MD   237 mL at 06/04/21 1036   ferrous sulfate tablet 325 mg  325 mg Oral Daily 08/04/21, DO   325 mg at 06/04/21 1024   fluticasone furoate-vilanterol (BREO ELLIPTA) 200-25 MCG/INH 1 puff  1 puff Inhalation Daily 08/04/21, DO   1 puff at 06/02/21 0948   gabapentin (NEURONTIN) capsule 100 mg  100 mg Oral q morning 08/02/21, DO   100 mg at 06/04/21 1024   And   gabapentin (  NEURONTIN) capsule 200 mg  200 mg Oral QHS Carollee Herter, DO   200 mg at 06/03/21 2241   hydrALAZINE (APRESOLINE) injection 10 mg  10 mg Intravenous Q4H PRN Amin, Ankit Chirag, MD       ipratropium-albuterol (DUONEB) 0.5-2.5 (3) MG/3ML nebulizer solution 3 mL  3 mL Nebulization Q4H PRN Amin, Ankit Chirag, MD       latanoprost (XALATAN) 0.005 % ophthalmic solution 1 drop  1 drop Both Eyes QHS Carollee Herter, DO   1 drop at 06/03/21 2236   loratadine (CLARITIN) tablet 10 mg  10 mg Oral Daily Carollee Herter, DO   10 mg at 06/04/21 1024   melatonin tablet 3 mg  3 mg Oral QHS PRN Carollee Herter, DO   3 mg at 06/03/21 0048   metoprolol tartrate (LOPRESSOR)  injection 5 mg  5 mg Intravenous Q4H PRN Dimple Nanas, MD       multivitamin with minerals tablet 1 tablet  1 tablet Oral Daily Carollee Herter, DO   1 tablet at 06/04/21 1024   ondansetron (ZOFRAN) tablet 4 mg  4 mg Oral Q8H PRN Carollee Herter, DO       oxyCODONE (Oxy IR/ROXICODONE) immediate release tablet 5 mg  5 mg Oral Q4H PRN Amin, Ankit Chirag, MD   5 mg at 06/04/21 1024   rosuvastatin (CRESTOR) tablet 20 mg  20 mg Oral QHS Carollee Herter, DO   20 mg at 06/03/21 2238   senna-docusate (Senokot-S) tablet 2 tablet  2 tablet Oral BID PRN Dimple Nanas, MD   2 tablet at 06/03/21 1136   sodium chloride flush (NS) 0.9 % injection 3 mL  3 mL Intravenous Q12H Carollee Herter, DO   3 mL at 06/04/21 1025   sodium chloride flush (NS) 0.9 % injection 3 mL  3 mL Intravenous PRN Carollee Herter, DO       traZODone (DESYREL) tablet 50 mg  50 mg Oral QHS PRN Amin, Ankit Chirag, MD       vitamin B-12 (CYANOCOBALAMIN) tablet 100 mcg  100 mcg Oral Daily Carollee Herter, DO   100 mcg at 06/04/21 1026     Discharge Medications: Please see discharge summary for a list of discharge medications.  Relevant Imaging Results:  Relevant Lab Results:   Additional Information SSN# [id number]  Delilah Shan, LCSWA

## 2021-06-05 ENCOUNTER — Non-Acute Institutional Stay (SKILLED_NURSING_FACILITY): Payer: Medicare Other | Admitting: Adult Health

## 2021-06-05 ENCOUNTER — Encounter (HOSPITAL_COMMUNITY): Payer: Self-pay | Admitting: Family Medicine

## 2021-06-05 ENCOUNTER — Encounter: Payer: Self-pay | Admitting: Adult Health

## 2021-06-05 ENCOUNTER — Encounter: Payer: Self-pay | Admitting: Internal Medicine

## 2021-06-05 DIAGNOSIS — I9589 Other hypotension: Secondary | ICD-10-CM

## 2021-06-05 DIAGNOSIS — F3130 Bipolar disorder, current episode depressed, mild or moderate severity, unspecified: Secondary | ICD-10-CM | POA: Diagnosis not present

## 2021-06-05 DIAGNOSIS — D649 Anemia, unspecified: Secondary | ICD-10-CM

## 2021-06-05 DIAGNOSIS — E46 Unspecified protein-calorie malnutrition: Secondary | ICD-10-CM | POA: Insufficient documentation

## 2021-06-05 DIAGNOSIS — G8929 Other chronic pain: Secondary | ICD-10-CM

## 2021-06-05 DIAGNOSIS — M545 Low back pain, unspecified: Secondary | ICD-10-CM

## 2021-06-05 DIAGNOSIS — N179 Acute kidney failure, unspecified: Secondary | ICD-10-CM

## 2021-06-05 DIAGNOSIS — E861 Hypovolemia: Secondary | ICD-10-CM

## 2021-06-05 DIAGNOSIS — I82502 Chronic embolism and thrombosis of unspecified deep veins of left lower extremity: Secondary | ICD-10-CM
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
## 2021-06-06 ENCOUNTER — Non-Acute Institutional Stay (SKILLED_NURSING_FACILITY): Payer: Medicare Other | Admitting: Internal Medicine

## 2021-06-06 ENCOUNTER — Encounter: Payer: Self-pay | Admitting: Internal Medicine

## 2021-06-06 DIAGNOSIS — I9589 Other hypotension: Secondary | ICD-10-CM

## 2021-06-06 DIAGNOSIS — D519 Vitamin B12 deficiency anemia, unspecified: Secondary | ICD-10-CM

## 2021-06-06 DIAGNOSIS — E538 Deficiency of other specified B group vitamins: Secondary | ICD-10-CM

## 2021-06-06 DIAGNOSIS — Z8673 Personal history of transient ischemic attack (TIA), and cerebral infarction without residual deficits: Secondary | ICD-10-CM | POA: Insufficient documentation

## 2021-06-06 DIAGNOSIS — N179 Acute kidney failure, unspecified: Secondary | ICD-10-CM

## 2021-06-06 DIAGNOSIS — E441 Mild protein-calorie malnutrition: Secondary | ICD-10-CM

## 2021-06-06 DIAGNOSIS — E861 Hypovolemia: Secondary | ICD-10-CM

## 2021-06-06 DIAGNOSIS — R9431 Abnormal electrocardiogram [ECG] [EKG]: Secondary | ICD-10-CM

## 2021-06-06 NOTE — Assessment & Plan Note (Addendum)
Cardiology consultation; R/O dysrhythmia as factor

## 2021-06-06 NOTE — Assessment & Plan Note (Signed)
Hx of syncope, recurrent hypotension & prolonged QT warrants Cardiology eval

## 2021-06-06 NOTE — Assessment & Plan Note (Signed)
Verify PCP has rechecked B12 level to verify adequate GI absorption.

## 2021-06-06 NOTE — Assessment & Plan Note (Signed)
SNF Nutritionist to assess

## 2021-06-06 NOTE — Assessment & Plan Note (Addendum)
Current H/H 9.9/31.8; prior H/H 10.9/35.3 ROS negative for bleeding dyscrasias & none reported by Staff Monitor CBC

## 2021-06-06 NOTE — Assessment & Plan Note (Signed)
Current Medication List reviewed. No definite nephrotoxic risk identified.Gabapentin is low dose. If CKD progresses Gabapentin will be weaned or discontinued.
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
## 2021-06-06 NOTE — Assessment & Plan Note (Signed)
Neurology F/U to assess need for life long anticoagulation

## 2021-06-06 NOTE — Patient Instructions (Signed)
See assessment and plan under each diagnosis in the problem list and acutely for this visit 

## 2021-06-13 LAB — BASIC METABOLIC PANEL
BUN: 18 (ref 4–21)
CO2: 27 — AB (ref 13–22)
Chloride: 103 (ref 99–108)
Creatinine: 0.8 (ref 0.5–1.1)
Glucose: 95
Potassium: 4.2 (ref 3.4–5.3)
Sodium: 139 (ref 137–147)

## 2021-06-13 LAB — CBC: RBC: 3.39 — AB (ref 3.87–5.11)

## 2021-06-13 LAB — CBC AND DIFFERENTIAL
HCT: 28 — AB (ref 36–46)
Hemoglobin: 9 — AB (ref 12.0–16.0)
Platelets: 185 (ref 150–399)
WBC: 7.3

## 2021-06-13 LAB — COMPREHENSIVE METABOLIC PANEL: Calcium: 9.3 (ref 8.7–10.7)

## 2021-06-17 ENCOUNTER — Non-Acute Institutional Stay (SKILLED_NURSING_FACILITY): Payer: Medicare Other | Admitting: Adult Health

## 2021-06-17 ENCOUNTER — Encounter: Payer: Self-pay | Admitting: Adult Health

## 2021-06-17 DIAGNOSIS — I825Z2 Chronic embolism and thrombosis of unspecified deep veins of left distal lower extremity: Secondary | ICD-10-CM

## 2021-06-17 DIAGNOSIS — I10 Essential (primary) hypertension: Secondary | ICD-10-CM

## 2021-06-17 DIAGNOSIS — G629 Polyneuropathy, unspecified: Secondary | ICD-10-CM | POA: Diagnosis not present

## 2021-06-17 DIAGNOSIS — M79605 Pain in left leg: Secondary | ICD-10-CM

## 2021-06-17 DIAGNOSIS — E559 Vitamin D deficiency, unspecified: Secondary | ICD-10-CM
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
## 2021-06-19 ENCOUNTER — Non-Acute Institutional Stay (SKILLED_NURSING_FACILITY): Payer: Medicare Other | Admitting: Internal Medicine

## 2021-06-19 ENCOUNTER — Encounter: Payer: Self-pay | Admitting: Internal Medicine

## 2021-06-19 DIAGNOSIS — G8929 Other chronic pain: Secondary | ICD-10-CM | POA: Diagnosis not present

## 2021-06-19 DIAGNOSIS — M87052 Idiopathic aseptic necrosis of left femur: Secondary | ICD-10-CM

## 2021-06-19 DIAGNOSIS — M545 Low back pain, unspecified: Secondary | ICD-10-CM | POA: Diagnosis not present

## 2021-06-19 DIAGNOSIS — E538 Deficiency of other specified B group vitamins: Secondary | ICD-10-CM
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
## 2021-06-19 NOTE — Assessment & Plan Note (Addendum)
The possible stress fracture of the femoral head and left hip AVN was documented on MRI only, not plain films. It is anticipated that higher quality imaging can be completed in Dr. Barbaraann Faster office to assess current hip status rather than employing mobile imaging here.  She is unable to participate in PT/OT @ the SNF @ this time.Marland Kitchen

## 2021-06-19 NOTE — Assessment & Plan Note (Addendum)
Check B12 level to verify GI absorption. B12 deficiency would have no role in back & LLE symptoms but could impact neurocognition if severe.

## 2021-06-19 NOTE — Patient Instructions (Signed)
See assessment and plan under each diagnosis in the problem list and acutely for this visit 

## 2021-06-19 NOTE — Assessment & Plan Note (Addendum)
Ortho appt rescheduled for 11/18; earlier appointment will be requested.  I'll ask Dr. Otelia Sergeant to assess the need for repeat imaging to assess the avascular necrosis of the left hip.

## 2021-06-20 LAB — VITAMIN B12: Vitamin B-12: 687

## 2021-06-24 ENCOUNTER — Encounter: Payer: Self-pay | Admitting: Adult Health

## 2021-06-24 ENCOUNTER — Emergency Department (HOSPITAL_COMMUNITY)
Admission: EM | Admit: 2021-06-24 | Discharge: 2021-06-24 | Disposition: A | Discharge status: Home / Self Care | Payer: Medicare Other | Attending: Student | Admitting: Student

## 2021-06-24 ENCOUNTER — Ambulatory Visit (INDEPENDENT_AMBULATORY_CARE_PROVIDER_SITE_OTHER): Payer: Medicare Other | Admitting: Orthopaedic Surgery

## 2021-06-24 ENCOUNTER — Other Ambulatory Visit: Payer: Self-pay

## 2021-06-24 ENCOUNTER — Emergency Department (HOSPITAL_COMMUNITY): Payer: Medicare Other

## 2021-06-24 DIAGNOSIS — Z7901 Long term (current) use of anticoagulants: Secondary | ICD-10-CM | POA: Insufficient documentation

## 2021-06-24 DIAGNOSIS — M1612 Unilateral primary osteoarthritis, left hip: Secondary | ICD-10-CM | POA: Diagnosis not present

## 2021-06-24 DIAGNOSIS — M25511 Pain in right shoulder: Secondary | ICD-10-CM | POA: Insufficient documentation

## 2021-06-24 DIAGNOSIS — I5030 Unspecified diastolic (congestive) heart failure: Secondary | ICD-10-CM | POA: Insufficient documentation

## 2021-06-24 DIAGNOSIS — M25521 Pain in right elbow: Secondary | ICD-10-CM | POA: Insufficient documentation

## 2021-06-24 DIAGNOSIS — S0003XA Contusion of scalp, initial encounter: Secondary | ICD-10-CM | POA: Insufficient documentation

## 2021-06-24 DIAGNOSIS — I110 Hypertensive heart disease with heart failure: Secondary | ICD-10-CM | POA: Insufficient documentation

## 2021-06-24 DIAGNOSIS — W19XXXA Unspecified fall, initial encounter: Secondary | ICD-10-CM

## 2021-06-24 DIAGNOSIS — Z79899 Other long term (current) drug therapy: Secondary | ICD-10-CM | POA: Diagnosis not present

## 2021-06-24 DIAGNOSIS — M25551 Pain in right hip: Secondary | ICD-10-CM | POA: Diagnosis not present

## 2021-06-24 DIAGNOSIS — J4541 Moderate persistent asthma with (acute) exacerbation: Secondary | ICD-10-CM | POA: Insufficient documentation

## 2021-06-24 DIAGNOSIS — W050XXA Fall from non-moving wheelchair, initial encounter: Secondary | ICD-10-CM | POA: Insufficient documentation

## 2021-06-24 DIAGNOSIS — S0990XA Unspecified injury of head, initial encounter: Secondary | ICD-10-CM | POA: Diagnosis present

## 2021-06-24 DIAGNOSIS — Z7951 Long term (current) use of inhaled steroids: Secondary | ICD-10-CM | POA: Insufficient documentation

## 2021-06-24 LAB — FOLATE: Folate (Folic Acid), Ser: 13.9

## 2021-06-24 MED ORDER — OXYCODONE-ACETAMINOPHEN 5-325 MG PO TABS
1.0000 | ORAL_TABLET | Freq: Once | ORAL | Status: AC
  Administered 2021-06-24: 1 via ORAL
  Filled 2021-06-24: qty 1

## 2021-06-24 NOTE — ED Notes (Signed)
Patient transported to CT 
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
## 2021-06-24 NOTE — ED Notes (Signed)
Patient is 15th on PTARs list
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
## 2021-06-24 NOTE — ED Notes (Signed)
Pt remains in xray.
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
## 2021-06-24 NOTE — ED Notes (Signed)
[facility] called to transport patient  

## 2021-06-25 ENCOUNTER — Non-Acute Institutional Stay (SKILLED_NURSING_FACILITY): Payer: Medicare Other | Admitting: Internal Medicine

## 2021-06-25 ENCOUNTER — Encounter: Payer: Self-pay | Admitting: Internal Medicine

## 2021-06-25 DIAGNOSIS — M25562 Pain in left knee: Secondary | ICD-10-CM

## 2021-06-25 DIAGNOSIS — G894 Chronic pain syndrome: Secondary | ICD-10-CM

## 2021-06-25 DIAGNOSIS — M545 Low back pain, unspecified: Secondary | ICD-10-CM | POA: Diagnosis not present

## 2021-06-25 DIAGNOSIS — G8929 Other chronic pain: Secondary | ICD-10-CM

## 2021-06-25 DIAGNOSIS — E538 Deficiency of other specified B group vitamins: Secondary | ICD-10-CM

## 2021-06-25 NOTE — Assessment & Plan Note (Signed)
06/20/2021 B12 level normal @ 687

## 2021-06-25 NOTE — Assessment & Plan Note (Addendum)
Elective left TKA should be deferred until Dr Roda Shutters & Dr Otelia Sergeant define the contribution of lumbar stenosis/neurogenic claudication and end-stage left hip & knee OA to her back, LLE and left knee pain syndrome.  Again the significance of left hip AVN as documented on MRI must be defined as well.
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
## 2021-06-25 NOTE — Assessment & Plan Note (Signed)
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

## 2021-06-25 NOTE — Patient Instructions (Addendum)
See assessment and plan under each diagnosis in the problem list and acutely for this visit. Total time 75  minutes; greater than 50% of the visit spent counseling patient and her family and coordinating care for problems addressed at this encounter as to clinically indicated orthopedic & neurosurgical evaluations before planned TKA.

## 2021-06-25 NOTE — Assessment & Plan Note (Signed)
On DOAC ; labs will be drawn.

## 2021-06-25 NOTE — Assessment & Plan Note (Addendum)
I shall ask her family to contact her PCP Dr. Donzetta Kohut to to help bring spinal stenosis evaluation to fruition ASAP

## 2021-06-26 LAB — CBC AND DIFFERENTIAL
HCT: 27 — AB (ref 36–46)
Hemoglobin: 8.8 — AB (ref 12.0–16.0)
Platelets: 149 — AB (ref 150–399)
WBC: 5

## 2021-06-26 LAB — BASIC METABOLIC PANEL
BUN: 13 (ref 4–21)
CO2: 26 — AB (ref 13–22)
Chloride: 103 (ref 99–108)
Creatinine: 0.7 (ref 0.5–1.1)
Glucose: 108
Potassium: 4.6 (ref 3.4–5.3)
Sodium: 140 (ref 137–147)

## 2021-06-26 LAB — COMPREHENSIVE METABOLIC PANEL: Calcium: 9.2 (ref 8.7–10.7)

## 2021-06-26 LAB — CBC: RBC: 3.25 — AB (ref 3.87–5.11)

## 2021-07-02 ENCOUNTER — Encounter: Payer: Self-pay | Admitting: Adult Health

## 2021-07-02 ENCOUNTER — Non-Acute Institutional Stay (SKILLED_NURSING_FACILITY): Payer: Medicare Other | Admitting: Adult Health

## 2021-07-02 DIAGNOSIS — Z87898 Personal history of other specified conditions: Secondary | ICD-10-CM | POA: Diagnosis not present

## 2021-07-02 DIAGNOSIS — I825Z2 Chronic embolism and thrombosis of unspecified deep veins of left distal lower extremity: Secondary | ICD-10-CM | POA: Diagnosis not present

## 2021-07-02 DIAGNOSIS — M1612 Unilateral primary osteoarthritis, left hip: Secondary | ICD-10-CM

## 2021-07-02 DIAGNOSIS — G629 Polyneuropathy, unspecified: Secondary | ICD-10-CM | POA: Diagnosis not present
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
## 2021-07-15 ENCOUNTER — Encounter: Payer: Self-pay | Admitting: Adult Health

## 2021-07-15 ENCOUNTER — Non-Acute Institutional Stay (SKILLED_NURSING_FACILITY): Payer: Medicare Other | Admitting: Adult Health

## 2021-07-15 DIAGNOSIS — D649 Anemia, unspecified: Secondary | ICD-10-CM

## 2021-07-15 DIAGNOSIS — M1612 Unilateral primary osteoarthritis, left hip: Secondary | ICD-10-CM

## 2021-07-15 DIAGNOSIS — I825Z2 Chronic embolism and thrombosis of unspecified deep veins of left distal lower extremity: Secondary | ICD-10-CM | POA: Diagnosis not present

## 2021-07-15 DIAGNOSIS — G629 Polyneuropathy, unspecified: Secondary | ICD-10-CM | POA: Diagnosis not present

## 2021-07-15 DIAGNOSIS — F3130 Bipolar disorder, current episode depressed, mild or moderate severity, unspecified: Secondary | ICD-10-CM
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
## 2021-07-17 ENCOUNTER — Telehealth: Payer: Self-pay

## 2021-07-17 NOTE — Telephone Encounter (Signed)
Pt called and would like a call back regarding surgery she has some questions about the surgery itself.  Please advise

## 2021-07-18 LAB — VITAMIN D 25 HYDROXY (VIT D DEFICIENCY, FRACTURES)
Vit D, 25-Hydroxy: 31.9
Vit D, 25-Hydroxy: 31.9

## 2021-07-18 NOTE — Telephone Encounter (Signed)
Trying again to reach patient regarding surgery and clearances after several messages left on voicemail.  I returned the call again today. Message left ton patient's voicemail asking patient to call my direct number.  Patient has not been cleared at this time due to the complexity of her health.  According to Dr. Donzetta Kohut, patient will need to see neurosurgery for her spine. Patient needs a repeat lumbar MRI.  Patient will also need to see hematology for follow up leg DVT.  Patient will eventually need 30-day event monitor, but does not have to done before surgery.

## 2021-07-19 NOTE — Telephone Encounter (Signed)
Thank you for the update!

## 2021-07-29 LAB — CBC AND DIFFERENTIAL
HCT: 30 — AB (ref 36–46)
Hemoglobin: 9.2 — AB (ref 12.0–16.0)
Platelets: 141 — AB (ref 150–399)
WBC: 5.3

## 2021-07-29 LAB — CBC: RBC: 3.56 — AB (ref 3.87–5.11)

## 2021-07-31 ENCOUNTER — Other Ambulatory Visit: Payer: Self-pay

## 2021-08-01 ENCOUNTER — Encounter: Payer: Self-pay | Admitting: Adult Health

## 2021-08-01 ENCOUNTER — Non-Acute Institutional Stay (SKILLED_NURSING_FACILITY): Payer: Medicare Other | Admitting: Adult Health

## 2021-08-01 DIAGNOSIS — R600 Localized edema: Secondary | ICD-10-CM

## 2021-08-01 DIAGNOSIS — M1612 Unilateral primary osteoarthritis, left hip: Secondary | ICD-10-CM | POA: Diagnosis not present

## 2021-08-01 DIAGNOSIS — I5032 Chronic diastolic (congestive) heart failure: Secondary | ICD-10-CM | POA: Diagnosis not present

## 2021-08-01 DIAGNOSIS — I825Z2 Chronic embolism and thrombosis of unspecified deep veins of left distal lower extremity: Secondary | ICD-10-CM | POA: Diagnosis not present
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
## 2021-08-04 LAB — BASIC METABOLIC PANEL
BUN: 20 (ref 4–21)
CO2: 25 — AB (ref 13–22)
Chloride: 101 (ref 99–108)
Creatinine: 0.8 (ref 0.5–1.1)
Glucose: 102
Potassium: 4.4 (ref 3.4–5.3)
Sodium: 140 (ref 137–147)

## 2021-08-04 LAB — COMPREHENSIVE METABOLIC PANEL
Calcium: 10 (ref 8.7–10.7)
GFR calc Af Amer: >90
GFR calc non Af Amer: 77.94

## 2021-08-08 ENCOUNTER — Other Ambulatory Visit: Payer: Self-pay | Admitting: Physician Assistant

## 2021-08-08 MED ORDER — METHOCARBAMOL 500 MG PO TABS: 500.0000 mg | ORAL_TABLET | Freq: Two times a day (BID) | ORAL | 0 refills | Status: DC | PRN

## 2021-08-08 MED ORDER — DOCUSATE SODIUM 100 MG PO CAPS: 100.0000 mg | ORAL_CAPSULE | Freq: Every day | ORAL | 2 refills | Status: DC | PRN

## 2021-08-08 MED ORDER — OXYCODONE-ACETAMINOPHEN 5-325 MG PO TABS: 1.0000 | ORAL_TABLET | Freq: Four times a day (QID) | ORAL | 0 refills | Status: DC | PRN

## 2021-08-08 MED ORDER — TRANEXAMIC ACID 1000 MG/10ML IV SOLN
2000.0000 mg | INTRAVENOUS | Status: AC
  Administered 2021-08-11: 1000 mg via TOPICAL
  Filled 2021-08-08: qty 20

## 2021-08-08 NOTE — Progress Notes (Signed)
Veronica Baker   PRE-OP INSTRUCTIONS:   Your procedure is scheduled on Monday 12/12. Please report to [facility] Main Entrance "A" at 0530 A.M., and check in at the Admitting office. Call this number if you have problems the morning of surgery: [phone]   Remember: Do not eat after midnight the night before your surgery  You may drink clear liquids until 04:15 AM the morning of your surgery.   Clear liquids allowed are: Water, Non-Citrus Juices (without pulp), Carbonated Beverages, Clear Tea, Black Coffee Only, and Gatorade   Medications to take morning of surgery with a sip of water include: acetaminophen (TYLENOL) if needed albuterol (PROVENTIL HFA;VENTOLIN HFA) 108 (90 Base) --- Please bring all inhalers with you the day of surgery.  citalopram (CELEXA) gabapentin (NEURONTIN)  loratadine (CLARITIN)  oxyCODONE-acetaminophen (PERCOCET)    *As of today, STOP taking any Aspirin (unless otherwise instructed by your surgeon), Aleve, Naproxen, Ibuprofen, Motrin, Advil, Goody's, BC's, all herbal medications, fish oil, and all vitamins.  *apixaban (ELIQUIS) last dose should be 12/9!!    *PLEASE send with patient last doses of ALL medications as this is necessary information for the anesthesia team   The Morning of Surgery Do not wear jewelry, make-up or nail polish. Do not wear lotions, powders, or perfumes, or deodorant Do not shave 48 hours prior to surgery.    Do not bring valuables to the hospital. [facility] is not responsible for any belongings or valuables.  If you are a smoker, DO NOT Smoke 24 hours prior to surgery  If you wear a CPAP at night please bring your mask the morning of surgery   Remember that you must have someone to transport you home after your surgery, and remain with you for 24 hours if you are discharged the same day.  Please bring cases for contacts, glasses, hearing aids, dentures or bridgework because it cannot be worn into surgery.    Patients discharged the day of surgery will not be allowed to drive home.   Please shower the NIGHT BEFORE/MORNING OF SURGERY (use antibacterial soap like DIAL soap if possible). Wear comfortable clothes the morning of surgery. Oral Hygiene is also important to reduce your risk of infection.  Remember - BRUSH YOUR TEETH THE MORNING OF SURGERY WITH YOUR REGULAR TOOTHPASTE  Patient denies shortness of breath, fever, cough and chest pain.
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
## 2021-08-08 NOTE — Anesthesia Preprocedure Evaluation (Addendum)
Anesthesia Evaluation  Patient identified by MRN, date of birth, ID band Patient awake    Reviewed: Allergy & Precautions, NPO status , Patient's Chart, lab work & pertinent test results  Airway Mallampati: I  TM Distance: >3 FB Neck ROM: Full    Dental  (+) Chipped, Dental Advisory Given,    Pulmonary asthma , sleep apnea ,    Pulmonary exam normal breath sounds clear to auscultation       Cardiovascular hypertension, Pt. on medications +CHF and + DVT  Normal cardiovascular exam Rhythm:Regular Rate:Normal  Echo 03/06/21: IMPRESSIONS  1. Left ventricular ejection fraction, by estimation, is 60 to 65%. The  left ventricle has normal function. The left ventricle has no regional  wall motion abnormalities. Left ventricular diastolic parameters are  consistent with Grade I diastolic  dysfunction (impaired relaxation).  2. Right ventricular systolic function is normal. The right ventricular  size is normal. Tricuspid regurgitation signal is inadequate for assessing  PA pressure.  3. The mitral valve is normal in structure. No evidence of mitral valve  regurgitation. No evidence of mitral stenosis.  4. The aortic valve is tricuspid. Aortic valve regurgitation is not  visualized. No aortic stenosis is present.  5. The inferior vena cava is normal in size with greater than 50%  respiratory variability, suggesting right atrial pressure of 3 mmHg.    Carotid US 03/06/21: Summary:  - Right Carotid: Velocities in the right ICA are consistent with a 1-39%  stenosis.  - Left Carotid: Velocities in the left ICA are consistent with a 1-39%  stenosis.  - Vertebrals: Bilateral vertebral arteries demonstrate antegrade flow.    Nuclear stress test 10/09/19 ([facility]): IMPRESSION:  - Normal cardiac perfusion exam. No evidence of ischemia or scar. Prognostically this is a low risk scan.  - Left ventricular ejection fraction of 64%.  Normal LV size. LV end-diastolic volume 58 mL, end-systolic volume 21 ml.  - Normal leftventricular systolic wall motion.    48 hour Holter 01/2019 ([facility]): Impression: Patient monitored for 48 hours. Analysis and rhythm strips revealed sinus rhythm at average rate 92 beats/min. Minimum heart rate 71 beats/min. Maximum heart rate 139 beats/min. No atrial fibrillation. PVCs (<1%) without complex ventricular ectopy. Therewere no pauses > 3 seconds in duration.   Neuro/Psych  Headaches, PSYCHIATRIC DISORDERS Depression Bipolar Disorder Schizophrenia CVA    GI/Hepatic negative GI ROS, Neg liver ROS,   Endo/Other  negative endocrine ROS  Renal/GU negative Renal ROS  negative genitourinary   Musculoskeletal  (+) Arthritis , Fibromyalgia -  Abdominal   Peds  Hematology  (+) Blood dyscrasia (on eliquis, last dose 12/9 ), ,   Anesthesia Other Findings   Reproductive/Obstetrics                          Anesthesia Physical Anesthesia Plan  ASA: 3  Anesthesia Plan: General   Post-op Pain Management: Ofirmev IV (intra-op)   Induction: Intravenous  PONV Risk Score and Plan: 3 and Treatment may vary due to age or medical condition, Ondansetron and Dexamethasone  Airway Management Planned: Oral ETT  Additional Equipment:   Intra-op Plan:   Post-operative Plan: Extubation in OR  Informed Consent: I have reviewed the patients History and Physical, chart, labs and discussed the procedure including the risks, benefits and alternatives for the proposed anesthesia with the patient or authorized representative who has indicated his/her understanding and acceptance.     Dental advisory given  Plan  Discussed with: CRNA  Anesthesia Plan Comments: (Will proceed with GA as pt has not stopped Eliquis for a full 72 hours.  )      Anesthesia Quick Evaluation

## 2021-08-11 ENCOUNTER — Observation Stay (HOSPITAL_COMMUNITY): Payer: Medicare Other

## 2021-08-11 ENCOUNTER — Encounter (HOSPITAL_COMMUNITY)
Admission: RE | Disposition: A | Discharge status: Skilled Nursing Facility | Payer: Self-pay | Source: Home / Self Care | Attending: Orthopaedic Surgery

## 2021-08-11 ENCOUNTER — Encounter (HOSPITAL_COMMUNITY): Payer: Self-pay | Admitting: Orthopaedic Surgery

## 2021-08-11 ENCOUNTER — Observation Stay (HOSPITAL_COMMUNITY)
Admission: RE | Admit: 2021-08-11 | Discharge: 2021-08-13 | Disposition: A | Discharge status: Skilled Nursing Facility | Payer: Medicare Other | Attending: Orthopaedic Surgery | Admitting: Orthopaedic Surgery

## 2021-08-11 ENCOUNTER — Ambulatory Visit (HOSPITAL_COMMUNITY): Payer: Medicare Other

## 2021-08-11 ENCOUNTER — Other Ambulatory Visit: Payer: Self-pay

## 2021-08-11 ENCOUNTER — Ambulatory Visit (HOSPITAL_COMMUNITY): Payer: Medicare Other | Admitting: Vascular Surgery

## 2021-08-11 DIAGNOSIS — J45909 Unspecified asthma, uncomplicated: Secondary | ICD-10-CM | POA: Insufficient documentation

## 2021-08-11 DIAGNOSIS — M87052 Idiopathic aseptic necrosis of left femur: Secondary | ICD-10-CM

## 2021-08-11 DIAGNOSIS — R7303 Prediabetes: Secondary | ICD-10-CM | POA: Diagnosis not present

## 2021-08-11 DIAGNOSIS — Z7901 Long term (current) use of anticoagulants: Secondary | ICD-10-CM | POA: Insufficient documentation

## 2021-08-11 DIAGNOSIS — Z419 Encounter for procedure for purposes other than remedying health state, unspecified: Secondary | ICD-10-CM

## 2021-08-11 DIAGNOSIS — I10 Essential (primary) hypertension: Secondary | ICD-10-CM | POA: Diagnosis not present

## 2021-08-11 DIAGNOSIS — M1612 Unilateral primary osteoarthritis, left hip: Principal | ICD-10-CM | POA: Insufficient documentation

## 2021-08-11 DIAGNOSIS — Z96649 Presence of unspecified artificial hip joint: Secondary | ICD-10-CM

## 2021-08-11 DIAGNOSIS — D649 Anemia, unspecified: Secondary | ICD-10-CM

## 2021-08-11 DIAGNOSIS — Z79899 Other long term (current) drug therapy: Secondary | ICD-10-CM | POA: Diagnosis not present

## 2021-08-11 DIAGNOSIS — Z20822 Contact with and (suspected) exposure to covid-19: Secondary | ICD-10-CM | POA: Diagnosis not present

## 2021-08-11 DIAGNOSIS — Z96642 Presence of left artificial hip joint: Secondary | ICD-10-CM

## 2021-08-11 HISTORY — PX: TOTAL HIP ARTHROPLASTY: SHX124

## 2021-08-11 HISTORY — PX: APPLICATION OF WOUND VAC: SHX5189

## 2021-08-11 LAB — CBC WITH DIFFERENTIAL/PLATELET
Abs Immature Granulocytes: 0.01 10*3/uL (ref 0.00–0.07)
Basophils Absolute: 0 10*3/uL (ref 0.0–0.1)
Basophils Relative: 0 %
Eosinophils Absolute: 0.2 10*3/uL (ref 0.0–0.5)
Eosinophils Relative: 3 %
HCT: 35.6 % — ABNORMAL LOW (ref 36.0–46.0)
Hemoglobin: 11.1 g/dL — ABNORMAL LOW (ref 12.0–15.0)
Immature Granulocytes: 0 %
Lymphocytes Relative: 41 %
Lymphs Abs: 2.1 10*3/uL (ref 0.7–4.0)
MCH: 27.4 pg (ref 26.0–34.0)
MCHC: 31.2 g/dL (ref 30.0–36.0)
MCV: 87.9 fL (ref 80.0–100.0)
Monocytes Absolute: 0.6 10*3/uL (ref 0.1–1.0)
Monocytes Relative: 11 %
Neutro Abs: 2.4 10*3/uL (ref 1.7–7.7)
Neutrophils Relative %: 45 %
Platelets: 178 10*3/uL (ref 150–400)
RBC: 4.05 MIL/uL (ref 3.87–5.11)
RDW: 14.3 % (ref 11.5–15.5)
WBC: 5.3 10*3/uL (ref 4.0–10.5)
nRBC: 0 % (ref 0.0–0.2)

## 2021-08-11 LAB — SURGICAL PCR SCREEN
MRSA, PCR: NEGATIVE
Staphylococcus aureus: NEGATIVE

## 2021-08-11 LAB — URINALYSIS, ROUTINE W REFLEX MICROSCOPIC
Bilirubin Urine: NEGATIVE
Glucose, UA: NEGATIVE mg/dL
Hgb urine dipstick: NEGATIVE
Ketones, ur: NEGATIVE mg/dL
Leukocytes,Ua: NEGATIVE
Nitrite: NEGATIVE
Protein, ur: NEGATIVE mg/dL
Specific Gravity, Urine: 1.015 (ref 1.005–1.030)
pH: 5 (ref 5.0–8.0)

## 2021-08-11 LAB — TYPE AND SCREEN
ABO/RH(D): B POS
Antibody Screen: NEGATIVE

## 2021-08-11 LAB — COMPREHENSIVE METABOLIC PANEL
ALT: 14 U/L (ref 0–44)
AST: 18 U/L (ref 15–41)
Albumin: 3.3 g/dL — ABNORMAL LOW (ref 3.5–5.0)
Alkaline Phosphatase: 75 U/L (ref 38–126)
Anion gap: 9 (ref 5–15)
BUN: 13 mg/dL (ref 8–23)
CO2: 28 mmol/L (ref 22–32)
Calcium: 9.5 mg/dL (ref 8.9–10.3)
Chloride: 100 mmol/L (ref 98–111)
Creatinine, Ser: 0.86 mg/dL (ref 0.44–1.00)
GFR, Estimated: >60 mL/min (ref >60)
Glucose, Bld: 101 mg/dL — ABNORMAL HIGH (ref 70–99)
Potassium: 3.6 mmol/L (ref 3.5–5.1)
Sodium: 137 mmol/L (ref 135–145)
Total Bilirubin: 0.4 mg/dL (ref 0.3–1.2)
Total Protein: 7.5 g/dL (ref 6.5–8.1)

## 2021-08-11 LAB — GLUCOSE, CAPILLARY: Glucose-Capillary: 104 mg/dL — ABNORMAL HIGH (ref 70–99)

## 2021-08-11 LAB — SARS CORONAVIRUS 2 BY RT PCR (HOSPITAL ORDER, PERFORMED IN ~~LOC~~ HOSPITAL LAB): SARS Coronavirus 2: NEGATIVE

## 2021-08-11 LAB — ABO/RH: ABO/RH(D): B POS

## 2021-08-11 SURGERY — ARTHROPLASTY, HIP, TOTAL, ANTERIOR APPROACH
Anesthesia: General | Site: Hip | Laterality: Right

## 2021-08-11 MED ORDER — IRRISEPT - 450ML BOTTLE WITH 0.05% CHG IN STERILE WATER, USP 99.95% OPTIME
TOPICAL | Status: DC | PRN
  Administered 2021-08-11: 450 mL via TOPICAL

## 2021-08-11 MED ORDER — ACETAMINOPHEN 10 MG/ML IV SOLN
INTRAVENOUS | Status: DC | PRN
  Administered 2021-08-11: 1000 mg via INTRAVENOUS

## 2021-08-11 MED ORDER — PANTOPRAZOLE SODIUM 40 MG PO TBEC
40.0000 mg | DELAYED_RELEASE_TABLET | Freq: Every day | ORAL | Status: DC
  Administered 2021-08-12: 40 mg via ORAL
  Filled 2021-08-11: qty 1

## 2021-08-11 MED ORDER — ACETAMINOPHEN 10 MG/ML IV SOLN
INTRAVENOUS | Status: AC
  Filled 2021-08-11: qty 100

## 2021-08-11 MED ORDER — FENTANYL CITRATE (PF) 100 MCG/2ML IJ SOLN
25.0000 ug | INTRAMUSCULAR | Status: DC | PRN
  Administered 2021-08-11 (×4): 25 ug via INTRAVENOUS

## 2021-08-11 MED ORDER — GLYCOPYRROLATE 0.2 MG/ML IJ SOLN
INTRAMUSCULAR | Status: DC | PRN
  Administered 2021-08-11: .2 mg via INTRAVENOUS

## 2021-08-11 MED ORDER — METOCLOPRAMIDE HCL 5 MG/ML IJ SOLN: 5.0000 mg | Freq: Three times a day (TID) | INTRAMUSCULAR | Status: DC | PRN

## 2021-08-11 MED ORDER — 0.9 % SODIUM CHLORIDE (POUR BTL) OPTIME
TOPICAL | Status: DC | PRN
  Administered 2021-08-11: 1000 mL

## 2021-08-11 MED ORDER — VANCOMYCIN HCL 1 G IV SOLR
INTRAVENOUS | Status: DC | PRN
  Administered 2021-08-11: 1000 mg via TOPICAL

## 2021-08-11 MED ORDER — POLYETHYLENE GLYCOL 3350 17 G PO PACK
17.0000 g | PACK | Freq: Every day | ORAL | Status: DC
  Administered 2021-08-11 – 2021-08-12 (×2): 17 g via ORAL
  Filled 2021-08-11 (×2): qty 1

## 2021-08-11 MED ORDER — FENTANYL CITRATE (PF) 100 MCG/2ML IJ SOLN
INTRAMUSCULAR | Status: AC
  Filled 2021-08-11: qty 2

## 2021-08-11 MED ORDER — FENTANYL CITRATE (PF) 250 MCG/5ML IJ SOLN
INTRAMUSCULAR | Status: DC | PRN
  Administered 2021-08-11: 50 ug via INTRAVENOUS
  Administered 2021-08-11 (×2): 100 ug via INTRAVENOUS

## 2021-08-11 MED ORDER — GABAPENTIN 100 MG PO CAPS
100.0000 mg | ORAL_CAPSULE | Freq: Two times a day (BID) | ORAL | Status: DC
  Administered 2021-08-12 (×2): 100 mg via ORAL
  Filled 2021-08-11 (×2): qty 1

## 2021-08-11 MED ORDER — LIDOCAINE HCL (CARDIAC) PF 100 MG/5ML IV SOSY
PREFILLED_SYRINGE | INTRAVENOUS | Status: DC | PRN
  Administered 2021-08-11: 100 mg via INTRATRACHEAL

## 2021-08-11 MED ORDER — ROCURONIUM 10MG/ML (10ML) SYRINGE FOR MEDFUSION PUMP - OPTIME
INTRAVENOUS | Status: DC | PRN
  Administered 2021-08-11: 100 mg via INTRAVENOUS

## 2021-08-11 MED ORDER — SUGAMMADEX SODIUM 200 MG/2ML IV SOLN
INTRAVENOUS | Status: DC | PRN
  Administered 2021-08-11: 200 mg via INTRAVENOUS

## 2021-08-11 MED ORDER — METOCLOPRAMIDE HCL 5 MG PO TABS: 5.0000 mg | ORAL_TABLET | Freq: Three times a day (TID) | ORAL | Status: DC | PRN

## 2021-08-11 MED ORDER — SORBITOL 70 % SOLN
30.0000 mL | Freq: Every day | Status: DC | PRN
  Filled 2021-08-11: qty 30

## 2021-08-11 MED ORDER — BUPIVACAINE-MELOXICAM ER 400-12 MG/14ML IJ SOLN
INTRAMUSCULAR | Status: DC | PRN
  Administered 2021-08-11: 400 mg

## 2021-08-11 MED ORDER — TRANEXAMIC ACID 1000 MG/10ML IV SOLN
INTRAVENOUS | Status: DC | PRN
  Administered 2021-08-11: 2000 mg via TOPICAL

## 2021-08-11 MED ORDER — ALUM & MAG HYDROXIDE-SIMETH 200-200-20 MG/5ML PO SUSP: 30.0000 mL | ORAL | Status: DC | PRN

## 2021-08-11 MED ORDER — SODIUM CHLORIDE 0.9 % IV SOLN: INTRAVENOUS | Status: DC

## 2021-08-11 MED ORDER — GABAPENTIN 100 MG PO CAPS
200.0000 mg | ORAL_CAPSULE | Freq: Every day | ORAL | Status: DC
  Administered 2021-08-11 – 2021-08-12 (×2): 200 mg via ORAL
  Filled 2021-08-11 (×2): qty 2

## 2021-08-11 MED ORDER — PHENOL 1.4 % MT LIQD: 1.0000 | OROMUCOSAL | Status: DC | PRN

## 2021-08-11 MED ORDER — HYDROMORPHONE HCL 1 MG/ML IJ SOLN
0.2500 mg | INTRAMUSCULAR | Status: DC | PRN
  Administered 2021-08-11 (×4): 0.25 mg via INTRAVENOUS

## 2021-08-11 MED ORDER — OXYCODONE HCL 5 MG PO TABS: 5.0000 mg | ORAL_TABLET | ORAL | Status: DC | PRN

## 2021-08-11 MED ORDER — CHLORHEXIDINE GLUCONATE 0.12 % MT SOLN
15.0000 mL | Freq: Once | OROMUCOSAL | Status: AC
  Administered 2021-08-11: 15 mL via OROMUCOSAL
  Filled 2021-08-11: qty 15

## 2021-08-11 MED ORDER — OXYCODONE HCL ER 10 MG PO T12A
10.0000 mg | EXTENDED_RELEASE_TABLET | Freq: Two times a day (BID) | ORAL | Status: DC
  Administered 2021-08-11 – 2021-08-12 (×3): 10 mg via ORAL
  Filled 2021-08-11 (×3): qty 1

## 2021-08-11 MED ORDER — PHENYLEPHRINE HCL-NACL 20-0.9 MG/250ML-% IV SOLN
INTRAVENOUS | Status: DC | PRN
  Administered 2021-08-11: 50 ug/min via INTRAVENOUS

## 2021-08-11 MED ORDER — HYDROMORPHONE HCL 1 MG/ML IJ SOLN
INTRAMUSCULAR | Status: AC
  Filled 2021-08-11: qty 1

## 2021-08-11 MED ORDER — ACETAMINOPHEN 500 MG PO TABS
1000.0000 mg | ORAL_TABLET | Freq: Four times a day (QID) | ORAL | Status: AC
  Administered 2021-08-11 – 2021-08-12 (×4): 1000 mg via ORAL
  Filled 2021-08-11 (×4): qty 2

## 2021-08-11 MED ORDER — LACTATED RINGERS IV SOLN: INTRAVENOUS | Status: DC

## 2021-08-11 MED ORDER — LACTATED RINGERS IV SOLN: INTRAVENOUS | Status: DC | PRN

## 2021-08-11 MED ORDER — FENTANYL CITRATE (PF) 250 MCG/5ML IJ SOLN
INTRAMUSCULAR | Status: AC
  Filled 2021-08-11: qty 5

## 2021-08-11 MED ORDER — ONDANSETRON HCL 4 MG/2ML IJ SOLN
INTRAMUSCULAR | Status: DC | PRN
  Administered 2021-08-11: 4 mg via INTRAVENOUS

## 2021-08-11 MED ORDER — VANCOMYCIN HCL 1000 MG IV SOLR
INTRAVENOUS | Status: AC
  Filled 2021-08-11: qty 20

## 2021-08-11 MED ORDER — CEFAZOLIN SODIUM-DEXTROSE 2-4 GM/100ML-% IV SOLN
2.0000 g | INTRAVENOUS | Status: AC
  Administered 2021-08-11: 2 g via INTRAVENOUS
  Filled 2021-08-11: qty 100

## 2021-08-11 MED ORDER — APIXABAN 5 MG PO TABS
5.0000 mg | ORAL_TABLET | Freq: Two times a day (BID) | ORAL | Status: DC
  Administered 2021-08-12 (×2): 5 mg via ORAL
  Filled 2021-08-11 (×2): qty 1

## 2021-08-11 MED ORDER — CITALOPRAM HYDROBROMIDE 20 MG PO TABS
20.0000 mg | ORAL_TABLET | Freq: Every day | ORAL | Status: DC
  Administered 2021-08-12: 20 mg via ORAL
  Filled 2021-08-11: qty 1

## 2021-08-11 MED ORDER — TRANEXAMIC ACID-NACL 1000-0.7 MG/100ML-% IV SOLN
1000.0000 mg | INTRAVENOUS | Status: DC
  Filled 2021-08-11: qty 100

## 2021-08-11 MED ORDER — DEXAMETHASONE SODIUM PHOSPHATE 10 MG/ML IJ SOLN
INTRAMUSCULAR | Status: DC | PRN
  Administered 2021-08-11: 10 mg via INTRAVENOUS

## 2021-08-11 MED ORDER — PROPOFOL 10 MG/ML IV BOLUS
INTRAVENOUS | Status: AC
  Filled 2021-08-11: qty 20

## 2021-08-11 MED ORDER — BUPIVACAINE-MELOXICAM ER 400-12 MG/14ML IJ SOLN
INTRAMUSCULAR | Status: AC
  Filled 2021-08-11: qty 1

## 2021-08-11 MED ORDER — OXYCODONE HCL 5 MG PO TABS: 10.0000 mg | ORAL_TABLET | ORAL | Status: DC | PRN

## 2021-08-11 MED ORDER — METHOCARBAMOL 1000 MG/10ML IJ SOLN
500.0000 mg | Freq: Four times a day (QID) | INTRAVENOUS | Status: DC | PRN
  Filled 2021-08-11: qty 5

## 2021-08-11 MED ORDER — ORAL CARE MOUTH RINSE: 15.0000 mL | Freq: Once | OROMUCOSAL | Status: AC

## 2021-08-11 MED ORDER — DEXAMETHASONE SODIUM PHOSPHATE 10 MG/ML IJ SOLN: 10.0000 mg | Freq: Once | INTRAMUSCULAR | Status: DC

## 2021-08-11 MED ORDER — MENTHOL 3 MG MT LOZG: 1.0000 | LOZENGE | OROMUCOSAL | Status: DC | PRN

## 2021-08-11 MED ORDER — HYDROMORPHONE HCL 1 MG/ML IJ SOLN: 0.5000 mg | INTRAMUSCULAR | Status: DC | PRN

## 2021-08-11 MED ORDER — DIPHENHYDRAMINE HCL 12.5 MG/5ML PO ELIX: 25.0000 mg | ORAL_SOLUTION | ORAL | Status: DC | PRN

## 2021-08-11 MED ORDER — DOCUSATE SODIUM 100 MG PO CAPS
100.0000 mg | ORAL_CAPSULE | Freq: Two times a day (BID) | ORAL | Status: DC
  Administered 2021-08-11 – 2021-08-12 (×2): 100 mg via ORAL
  Filled 2021-08-11 (×2): qty 1

## 2021-08-11 MED ORDER — MIDAZOLAM HCL 2 MG/2ML IJ SOLN
INTRAMUSCULAR | Status: AC
  Filled 2021-08-11: qty 2

## 2021-08-11 MED ORDER — PROPOFOL 10 MG/ML IV BOLUS
INTRAVENOUS | Status: DC | PRN
  Administered 2021-08-11: 20 mg via INTRAVENOUS
  Administered 2021-08-11: 50 mg via INTRAVENOUS
  Administered 2021-08-11: 130 mg via INTRAVENOUS

## 2021-08-11 MED ORDER — METHOCARBAMOL 500 MG PO TABS: 500.0000 mg | ORAL_TABLET | Freq: Four times a day (QID) | ORAL | Status: DC | PRN

## 2021-08-11 MED ORDER — SODIUM CHLORIDE 0.9 % IR SOLN
Status: DC | PRN
  Administered 2021-08-11: 1000 mL

## 2021-08-11 MED ORDER — POVIDONE-IODINE 10 % EX SWAB
2.0000 "application " | Freq: Once | CUTANEOUS | Status: AC
  Administered 2021-08-11: 2 via TOPICAL

## 2021-08-11 MED ORDER — ACETAMINOPHEN 500 MG PO TABS: 1000.0000 mg | ORAL_TABLET | Freq: Once | ORAL | Status: DC

## 2021-08-11 MED ORDER — CEFAZOLIN SODIUM-DEXTROSE 2-4 GM/100ML-% IV SOLN
2.0000 g | Freq: Four times a day (QID) | INTRAVENOUS | Status: AC
  Administered 2021-08-11 – 2021-08-12 (×2): 2 g via INTRAVENOUS
  Filled 2021-08-11 (×2): qty 100

## 2021-08-11 MED ORDER — TRANEXAMIC ACID-NACL 1000-0.7 MG/100ML-% IV SOLN
1000.0000 mg | Freq: Once | INTRAVENOUS | Status: AC
  Administered 2021-08-11: 1000 mg via INTRAVENOUS
  Filled 2021-08-11: qty 100

## 2021-08-11 MED ORDER — ACETAMINOPHEN 325 MG PO TABS: 325.0000 mg | ORAL_TABLET | Freq: Four times a day (QID) | ORAL | Status: DC | PRN

## 2021-08-11 SURGICAL SUPPLY — 64 items
BAG COUNTER SPONGE SURGICOUNT (BAG) ×4 IMPLANT
BAG DECANTER FOR FLEXI CONT (MISCELLANEOUS) ×4 IMPLANT
BAG SPNG CNTER NS LX DISP (BAG) ×2
CANISTER WOUNDNEG PRESSURE 500 (CANNISTER) ×1 IMPLANT
CELLS DAT CNTRL 66122 CELL SVR (MISCELLANEOUS) IMPLANT
COVER PERINEAL POST (MISCELLANEOUS) ×3 IMPLANT
COVER SURGICAL LIGHT HANDLE (MISCELLANEOUS) ×3 IMPLANT
DRAPE C-ARM 42X72 X-RAY (DRAPES) ×3 IMPLANT
DRAPE POUCH INSTRU U-SHP 10X18 (DRAPES) ×3 IMPLANT
DRAPE STERI IOBAN 125X83 (DRAPES) ×4 IMPLANT
DRAPE U-SHAPE 47X51 STRL (DRAPES) ×6 IMPLANT
DRESSING PEEL AND PLAC PRVNA20 (GAUZE/BANDAGES/DRESSINGS) IMPLANT
DRSG AQUACEL AG ADV 3.5X10 (GAUZE/BANDAGES/DRESSINGS) ×3 IMPLANT
DRSG PEEL AND PLACE PREVENA 20 (GAUZE/BANDAGES/DRESSINGS) ×3
DURAPREP 26ML APPLICATOR (WOUND CARE) ×8 IMPLANT
ELECT BLADE 4.0 EZ CLEAN MEGAD (MISCELLANEOUS) ×3
ELECT REM PT RETURN 9FT ADLT (ELECTROSURGICAL) ×3
ELECTRODE BLDE 4.0 EZ CLN MEGD (MISCELLANEOUS) ×3 IMPLANT
ELECTRODE REM PT RTRN 9FT ADLT (ELECTROSURGICAL) ×3 IMPLANT
GLOVE SURG LTX SZ7 (GLOVE) ×8 IMPLANT
GLOVE SURG SYN 7.5  E (GLOVE) ×12
GLOVE SURG SYN 7.5 E (GLOVE) ×8 IMPLANT
GLOVE SURG SYN 7.5 PF PI (GLOVE) ×8 IMPLANT
GLOVE SURG UNDER POLY LF SZ7 (GLOVE) ×6 IMPLANT
GLOVE SURG UNDER POLY LF SZ7.5 (GLOVE) ×8 IMPLANT
GOWN STRL REIN XL XLG (GOWN DISPOSABLE) ×4 IMPLANT
GOWN STRL REUS W/ TWL LRG LVL3 (GOWN DISPOSABLE) IMPLANT
GOWN STRL REUS W/ TWL XL LVL3 (GOWN DISPOSABLE) ×2 IMPLANT
GOWN STRL REUS W/TWL LRG LVL3 (GOWN DISPOSABLE)
GOWN STRL REUS W/TWL XL LVL3 (GOWN DISPOSABLE) ×3
HANDPIECE INTERPULSE COAX TIP (DISPOSABLE) ×3
HEAD CERAMIC DELTA 36 PLUS 1.5 (Hips) ×1 IMPLANT
HOOD PEEL AWAY FLYTE STAYCOOL (MISCELLANEOUS) ×6 IMPLANT
IV NS IRRIG 3000ML ARTHROMATIC (IV SOLUTION) ×4 IMPLANT
JET LAVAGE IRRISEPT WOUND (IRRIGATION / IRRIGATOR) ×3
KIT BASIN OR (CUSTOM PROCEDURE TRAY) ×3 IMPLANT
KIT DRSG PREVENA PLUS 7DAY 125 (MISCELLANEOUS) ×1 IMPLANT
LAVAGE JET IRRISEPT WOUND (IRRIGATION / IRRIGATOR) ×3 IMPLANT
LINER NEUTRAL 52X36MM PLUS 4 (Liner) ×1 IMPLANT
MARKER SKIN DUAL TIP RULER LAB (MISCELLANEOUS) ×3 IMPLANT
NDL SPNL 18GX3.5 QUINCKE PK (NEEDLE) ×2 IMPLANT
NEEDLE SPNL 18GX3.5 QUINCKE PK (NEEDLE) ×3 IMPLANT
PACK TOTAL JOINT (CUSTOM PROCEDURE TRAY) ×3 IMPLANT
PACK UNIVERSAL I (CUSTOM PROCEDURE TRAY) ×3 IMPLANT
PIN SECTOR W/GRIP ACE CUP 52MM (Hips) ×1 IMPLANT
RETRACTOR WND ALEXIS 18 MED (MISCELLANEOUS) IMPLANT
RTRCTR WOUND ALEXIS 18CM MED (MISCELLANEOUS)
SAW OSC TIP CART 19.5X105X1.3 (SAW) ×3 IMPLANT
SET HNDPC FAN SPRY TIP SCT (DISPOSABLE) ×3 IMPLANT
STAPLER VISISTAT 35W (STAPLE) IMPLANT
STEM FEMORAL SZ5 HIGH ACTIS (Stem) ×1 IMPLANT
SUT ETHIBOND 2 V 37 (SUTURE) ×4 IMPLANT
SUT VIC AB 0 CT1 27 (SUTURE) ×3
SUT VIC AB 0 CT1 27XBRD ANBCTR (SUTURE) ×3 IMPLANT
SUT VIC AB 1 CTX 36 (SUTURE) ×3
SUT VIC AB 1 CTX36XBRD ANBCTR (SUTURE) ×3 IMPLANT
SUT VIC AB 2-0 CT1 27 (SUTURE) ×6
SUT VIC AB 2-0 CT1 TAPERPNT 27 (SUTURE) ×6 IMPLANT
SYR 50ML LL SCALE MARK (SYRINGE) ×4 IMPLANT
TOWEL GREEN STERILE (TOWEL DISPOSABLE) ×4 IMPLANT
TRAY CATH 16FR W/PLASTIC CATH (SET/KITS/TRAYS/PACK) IMPLANT
TRAY FOLEY W/BAG SLVR 16FR (SET/KITS/TRAYS/PACK)
TRAY FOLEY W/BAG SLVR 16FR ST (SET/KITS/TRAYS/PACK) ×3 IMPLANT
YANKAUER SUCT BULB TIP NO VENT (SUCTIONS) ×4 IMPLANT

## 2021-08-11 NOTE — Transfer of Care (Signed)
Immediate Anesthesia Transfer of Care Note  Patient: Veronica Baker  Procedure(s) Performed: LEFT TOTAL HIP ARTHROPLASTY ANTERIOR APPROACH (Left: Hip)  Patient Location: PACU  Anesthesia Type:General  Level of Consciousness: drowsy, patient cooperative and responds to stimulation  Airway & Oxygen Therapy: Patient Spontanous Breathing and Patient connected to nasal cannula oxygen  Post-op Assessment: Report given to RN, Post -op Vital signs reviewed and stable and Patient moving all extremities X 4  Post vital signs: Reviewed and stable  Last Vitals:  Vitals Value Taken Time  BP 142/87 08/11/21 1229  Temp    Pulse 93 08/11/21 1231  Resp 16 08/11/21 1231  SpO2 100 % 08/11/21 1231  Vitals shown include unvalidated device data.  Last Pain:  Vitals:   08/11/21 0656  TempSrc:   PainSc: 8       Patients Stated Pain Goal: 0 (08/11/21 0656)  Complications: No notable events documented.

## 2021-08-11 NOTE — Anesthesia Procedure Notes (Signed)
Procedure Name: Intubation Date/Time: 08/11/2021 10:30 AM Performed by: Claris Che, CRNA Pre-anesthesia Checklist: Patient identified, Emergency Drugs available, Suction available, Patient being monitored and Timeout performed Patient Re-evaluated:Patient Re-evaluated prior to induction Oxygen Delivery Method: Circle system utilized Preoxygenation: Pre-oxygenation with 100% oxygen Induction Type: Cricoid Pressure applied Ventilation: Mask ventilation without difficulty Laryngoscope Size: Mac and 4 Grade View: Grade I Tube type: Oral Tube size: 7.5 mm Number of attempts: 1 Airway Equipment and Method: Stylet Placement Confirmation: ETT inserted through vocal cords under direct vision, positive ETCO2 and breath sounds checked- equal and bilateral Secured at: 22 cm Tube secured with: Tape Dental Injury: Teeth and Oropharynx as per pre-operative assessment

## 2021-08-11 NOTE — Op Note (Signed)
LEFT TOTAL HIP ARTHROPLASTY ANTERIOR APPROACH  Procedure Note Veronica Baker   [id number]  Pre-op Diagnosis: left hip degenerative joint disease     Post-op Diagnosis: same   Operative Procedures  1. Total hip replacement; Left hip; uncemented [id number]  2.  Application of incisional VAC  Surgeon: Gershon Mussel, M.D.  Assist: Oneal Grout, PA-C   Anesthesia: general  Prosthesis: Depuy Acetabulum: Pinnacle 52 mm Femur: Actis 5 HO Head: 36 mm size: +1.5 Liner: +4 Bearing Type: ceramic/poly  Total Hip Arthroplasty (Anterior Approach) Op Note:  After informed consent was obtained and the operative extremity marked in the holding area, the patient was brought back to the operating room and placed supine on the HANA table. Next, the operative extremity was prepped and draped in normal sterile fashion. Surgical timeout occurred verifying patient identification, surgical site, surgical procedure and administration of antibiotics.  A modified anterior Smith-Peterson approach to the hip was performed, using the interval between tensor fascia lata and sartorius.  Dissection was carried bluntly down onto the anterior hip capsule. The lateral femoral circumflex vessels were identified and coagulated. A capsulotomy was performed and the capsular flaps tagged for later repair.  The neck osteotomy was performed. The femoral head was removed which showed severe wear and severe synovitis in the joint, the acetabular rim was cleared of soft tissue and attention was turned to reaming the acetabulum.  Sequential reaming was performed under fluoroscopic guidance. We reamed to a size 51 mm, and then impacted the acetabular shell.  The liner was then placed after irrigation and attention turned to the femur.  After placing the femoral hook, the leg was taken to externally rotated, extended and adducted position taking care to perform soft tissue releases to allow for adequate mobilization of the  femur. Soft tissue was cleared from the shoulder of the greater trochanter and the hook elevator used to improve exposure of the proximal femur. Sequential broaching performed up to a size 5. We tried both standard and high offset necks and felt the high offset best restored her native anatomy.  Trial neck and head were placed. The leg was brought back up to neutral and the construct reduced.  Antibiotic irrigation was placed in the surgical wound and kept for at least 1 minute.  The position and sizing of components, offset and leg lengths were checked using fluoroscopy. Stability of the construct was checked in extension and external rotation without any subluxation or impingement of prosthesis. We dislocated the prosthesis, dropped the leg back into position, removed trial components, and irrigated copiously. The final stem and head was then placed, the leg brought back up, the system reduced and fluoroscopy used to verify positioning.  We irrigated, obtained hemostasis and closed the capsule using #2 ethibond suture.  One gram of vancomycin powder was placed in the surgical bed.   One gram of topical tranexamic acid was injected into the joint.  The fascia was closed with #1 vicryl plus, the deep fat layer was closed with 0 vicryl, the subcutaneous layers closed with 2.0 Vicryl Plus and the skin closed with 2.0 nylon and incisional VAC. A sterile dressing was applied. The patient was awakened in the operating room and taken to recovery in stable condition.  All sponge, needle, and instrument counts were correct at the end of the case.   Tessa Lerner, my PA, was a medical necessity for opening, closing, limb positioning, retracting, exposing, and overall facilitation and timely completion of the surgery.  Position: supine  Complications: see description of procedure.  Time Out: performed   Drains/Packing: none  Estimated blood loss: see anesthesia record  Returned to Recovery Room: in good  condition.   Antibiotics: yes   Mechanical VTE (DVT) Prophylaxis: sequential compression devices, TED thigh-high  Chemical VTE (DVT) Prophylaxis: aspirin   Fluid Replacement: see anesthesia record  Specimens Removed: 1 to pathology   Sponge and Instrument Count Correct? yes   PACU: portable radiograph - low AP   Plan/RTC: Return in 2 weeks for staple removal. Weight Bearing/Load Lower Extremity: full  Hip precautions: none Suture Removal: 2 weeks   N. Glee Arvin, MD [facility] 11:57 AM   Implant Name Type Inv. Item Serial No. Manufacturer Lot No. LRB No. Used Action  PIN SECTOR W/GRIP ACE CUP - [id number] Hips PIN SECTOR W/GRIP ACE CUP [id number] [employer] [id number] Left 1 Implanted  LINER NEUTRAL 52X36MM PLUS 4 - [id number] Liner LINER NEUTRAL 52X36MM PLUS 4 [id number] [employer] [id number] Left 1 Implanted  HEAD CERAMIC DELTA 36 PLUS 1.5 - [id number] Hips HEAD CERAMIC DELTA 36 PLUS 1.5  [employer] [id number] Left 1 Implanted  STEM FEMORAL SZ5 HIGH ACTIS - [id number] Stem STEM FEMORAL SZ5 HIGH ACTIS  [employer] [id number] Left 1 Implanted

## 2021-08-11 NOTE — Discharge Instructions (Signed)

## 2021-08-11 NOTE — Progress Notes (Signed)
Per Iris at [facility], patient had medications in applesauce at 0330.  Dr. Armond Hang and Dr. Roda Shutters aware.  Case will need to be delayed until 0930.
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
## 2021-08-11 NOTE — Evaluation (Signed)
Physical Therapy Evaluation Patient Details Name: Veronica Baker MRN: [id number] DOB: 1951-02-08 Today's Date: 08/11/2021  History of Present Illness  Pt is 70 yo female s/p L anterior THA on 08/11/21.  Pt with hx including but not limited to arthritis, bipolar, fibromyalgia, HLD, HTN, neuropathy, CVA, sleep apnea  Clinical Impression  Pt is s/p THA resulting in the deficits listed below (see PT Problem List). Pt has been at [facility] since October with frequent falls and limited mobility (mostly transfers with limited walking).  Pt and family hoping that THA will improve pain and mobility, but also report that bil TKAs have been mentioned.   Today , pt very lethargic (sx finished >5 hrs prior to eval) and requiring max to max of 2 for transfers (sit to stand).  She was not able to take steps or pivot to chair today - limited due to lethargy and amount of assist required. Pt with fair rehab potential - do recommend return to SNF as pt from SNF prior to admission and for therapy. Pt will benefit from skilled PT to increase their independence and safety with mobility to allow discharge to the venue listed below.         Recommendations for follow up therapy are one component of a multi-disciplinary discharge planning process, led by the attending physician.  Recommendations may be updated based on patient status, additional functional criteria and insurance authorization.  Follow Up Recommendations Skilled nursing-short term rehab (<3 hours/day) (pt from [facility] with plan to retun)    Assistance Recommended at Discharge Frequent or constant Supervision/Assistance  Functional Status Assessment Patient has had a recent decline in their functional status and demonstrates the ability to make significant improvements in function in a reasonable and predictable amount of time.  Equipment Recommendations  None recommended by PT    Recommendations for Other Services       Precautions / Restrictions  Precautions Precautions: Fall Restrictions Weight Bearing Restrictions: Yes LLE Weight Bearing: Weight bearing as tolerated      Mobility  Bed Mobility Overal bed mobility: Needs Assistance Bed Mobility: Supine to Sit;Sit to Supine     Supine to sit: Max assist Sit to supine: Max assist;+2 for physical assistance   General bed mobility comments: Pt requiring assist for legs and trunk with bed pad to facilitate all transfers.  Increased time and cues for sequencing.  Pt very stiff during transfers    Transfers Overall transfer level: Needs assistance Equipment used: Rolling walker (2 wheels) Transfers: Sit to/from Stand;Bed to chair/wheelchair/BSC Sit to Stand: Mod assist   Step pivot transfers: Max assist       General transfer comment: Pt requirng cues for hand placement , bed elevated, and mod A with increased time to rise.  Attempted taking side steps at EOB but pt requiring max A and only pivoting feet a few inches with assist to weight shift and move feet    Ambulation/Gait                  Stairs            Wheelchair Mobility    Modified Rankin (Stroke Patients Only)       Balance Overall balance assessment: Needs assistance Sitting-balance support: No upper extremity supported Sitting balance-Leahy Scale: Fair     Standing balance support: Bilateral upper extremity supported;Reliant on assistive device for balance Standing balance-Leahy Scale: Poor Standing balance comment: RW and min-mod A  Pertinent Vitals/Pain Pain Assessment: Faces Faces Pain Scale: Hurts a little bit Pain Location: L hip with transfers Pain Descriptors / Indicators: Discomfort Pain Intervention(s): Limited activity within patient's tolerance;Monitored during session;Repositioned    Home Living Family/patient expects to be discharged to:: Skilled nursing facility                   Additional Comments: Pt from  [facility] since October with plan to return    Prior Function Prior Level of Function : Needs assist       Physical Assist : Mobility (physical);ADLs (physical) Mobility (physical): Bed mobility;Transfers;Gait;Stairs ADLs (physical): Bathing;Dressing Mobility Comments: Pt was at SNF for therapy but with limited therapy due to pain.  She has had several recent falls and hospital admission.  Pt could stand and transfer to w/c on her own on certain days but not consistent.  Family reports she did take limited shuffle steps.  Back during the summer pt was living and home but still l imited ambulator ADLs Comments: Pt requiring assist for all ADLs for the past few months     Hand Dominance   Dominant Hand: Right    Extremity/Trunk Assessment   Upper Extremity Assessment Upper Extremity Assessment: LUE deficits/detail;RUE deficits/detail RUE Deficits / Details: Pt very stiff and slow moving throughout.  Tended to keep elbows flexed but could straighten.  Shoulder elevation to only ~90 degrees.  MMT: 4/5 throughout LUE Deficits / Details: Pt very stiff and slow moving throughout.  Tended to keep elbows flexed but could straighten.  Shoulder elevation to only ~90 degrees.  MMT: 4/5 throughout    Lower Extremity Assessment Lower Extremity Assessment: LLE deficits/detail;RLE deficits/detail RLE Deficits / Details: ROM: pt stiff and tending to keep knees and hip flexed, with stretching able to get near neutral knee ext and hip ext (lacking 5-10 degrees each); MMT: ankle and knee 5/5, hip 4/5 LLE Deficits / Details: ROM: pt stiff and tending to keep knees and hip flexed, with stretching (gentle at hip) able to get near neutral knee ext and hip ext (lacking 5-10 degrees each); MMT: ankle 5/5, knee 2/5, hip 1/5    Cervical / Trunk Assessment Cervical / Trunk Assessment: Kyphotic;Other exceptions Cervical / Trunk Exceptions: Tends to keep head turned R but could correct - reports CVA in past   Communication   Communication: Other (comment) (lethargy and soft spoken)  Cognition Arousal/Alertness: Lethargic;Suspect due to medications Behavior During Therapy: [facility] for tasks assessed/performed Overall Cognitive Status: Impaired/Different from baseline Area of Impairment: Orientation;Following commands;Problem solving                 Orientation Level: Disoriented to;Time     Following Commands: Follows one step commands with increased time     Problem Solving: Slow processing;Decreased initiation;Difficulty sequencing;Requires verbal cues;Requires tactile cues General Comments: Pt lethargic.  Pt soft spoken and often mumbling due to lethgary.  Does make some confused statements at times.  Seems related to lethargy per famiy        General Comments General comments (skin integrity, edema, etc.): VSS but pt very lethargic; Son and dtr in law present  Family asking about d/c plans - discussed that normally surgeon determines plan after joint replacement but considering pt from facility would likely return to facility once MEDICALLY stable.    Exercises Other Exercises Other Exercises: Gentle stretching bil hip and knee to neutral 30 sec hold   Assessment/Plan    PT Assessment Patient needs continued PT services  PT Problem List Decreased strength;Decreased mobility;Decreased safety awareness;Decreased range of motion;Decreased coordination;Impaired tone;Decreased activity tolerance;Decreased cognition;Decreased balance;Decreased knowledge of use of DME;Pain       PT Treatment Interventions DME instruction;Therapeutic activities;Modalities;Gait training;Therapeutic exercise;Patient/family education;Balance training;Wheelchair mobility training;Functional mobility training;Manual techniques    PT Goals (Current goals can be found in the Care Plan section)  Acute Rehab PT Goals Patient Stated Goal: return to walking PT Goal Formulation: With patient/family Time For  Goal Achievement: 08/25/21 Potential to Achieve Goals: Fair    Frequency 7X/week   Barriers to discharge        Co-evaluation               AM-PAC PT "6 Clicks" Mobility  Outcome Measure Help needed turning from your back to your side while in a flat bed without using bedrails?: A Lot Help needed moving from lying on your back to sitting on the side of a flat bed without using bedrails?: Total Help needed moving to and from a bed to a chair (including a wheelchair)?: Total Help needed standing up from a chair using your arms (e.g., wheelchair or bedside chair)?: Total Help needed to walk in hospital room?: Total Help needed climbing 3-5 steps with a railing? : Total 6 Click Score: 7    End of Session Equipment Utilized During Treatment: Gait belt Activity Tolerance: Patient tolerated treatment well Patient left: in bed;with call bell/phone within reach;with bed alarm set Nurse Communication: Mobility status;Need for lift equipment;Other (comment) (Even though joint replacement could benefit from purewick due to urinary frequency and very limited/max A 2 mobility) PT Visit Diagnosis: Other abnormalities of gait and mobility (R26.89);Muscle weakness (generalized) (M62.81)    Time: 1710-1753 PT Time Calculation (min) (ACUTE ONLY): 43 min   Charges:   PT Evaluation $PT Eval Moderate Complexity: 1 Mod PT Treatments $Therapeutic Activity: 23-37 mins        Anise Salvo, PT Acute Rehab Services Pager [phone] [facility] [phone]   Rayetta [facility] 08/11/2021, 6:18 PM

## 2021-08-12 ENCOUNTER — Other Ambulatory Visit: Payer: Self-pay | Admitting: Physician Assistant

## 2021-08-12 ENCOUNTER — Encounter (HOSPITAL_COMMUNITY): Payer: Self-pay | Admitting: Orthopaedic Surgery

## 2021-08-12 DIAGNOSIS — M1612 Unilateral primary osteoarthritis, left hip: Secondary | ICD-10-CM | POA: Diagnosis not present

## 2021-08-12 LAB — BASIC METABOLIC PANEL
Anion gap: 7 (ref 5–15)
BUN: 21 mg/dL (ref 8–23)
CO2: 25 mmol/L (ref 22–32)
Calcium: 8.8 mg/dL — ABNORMAL LOW (ref 8.9–10.3)
Chloride: 99 mmol/L (ref 98–111)
Creatinine, Ser: 1.1 mg/dL — ABNORMAL HIGH (ref 0.44–1.00)
GFR, Estimated: 55 mL/min — ABNORMAL LOW (ref >60)
Glucose, Bld: 157 mg/dL — ABNORMAL HIGH (ref 70–99)
Potassium: 4.5 mmol/L (ref 3.5–5.1)
Sodium: 131 mmol/L — ABNORMAL LOW (ref 135–145)

## 2021-08-12 LAB — CBC
HCT: 27.4 % — ABNORMAL LOW (ref 36.0–46.0)
Hemoglobin: 8.5 g/dL — ABNORMAL LOW (ref 12.0–15.0)
MCH: 26.5 pg (ref 26.0–34.0)
MCHC: 31 g/dL (ref 30.0–36.0)
MCV: 85.4 fL (ref 80.0–100.0)
Platelets: 153 10*3/uL (ref 150–400)
RBC: 3.21 MIL/uL — ABNORMAL LOW (ref 3.87–5.11)
RDW: 13.6 % (ref 11.5–15.5)
WBC: 8.8 10*3/uL (ref 4.0–10.5)
nRBC: 0 % (ref 0.0–0.2)

## 2021-08-12 MED ORDER — OXYCODONE-ACETAMINOPHEN 5-325 MG PO TABS: 1.0000 | ORAL_TABLET | Freq: Four times a day (QID) | ORAL | 0 refills | Status: DC | PRN

## 2021-08-12 NOTE — TOC Transition Note (Signed)
Transition of Care [facility]) - CM/SW Discharge Note   Patient Details  Name: ESTELITA ITEN MRN: [id number] Date of Birth: [birth year]  [facility]) CM/SW Contact:  Lorri [facility], LCSW Phone Number: 08/12/2021, 3:10 PM   Clinical Narrative:   Pt discharging to [place], room 102A.  RN call report to [phone].  PTAR called [phone].     Final next level of care: Long Term Nursing Home Barriers to Discharge: Barriers Resolved   Patient Goals and CMS Choice   CMS Medicare.gov Compare Post Acute Care list provided to::  (pt would like to return to [facility])    Discharge Placement              Patient chooses bed at:  [facility]) Patient to be transferred to facility by: PTAR Name of family member notified: son [name] Patient and family notified of of transfer: 08/12/21  Discharge Plan and Services In-house Referral: Clinical Social Work   Post Acute Care Choice: Skilled Nursing Facility                               Social Determinants of Health (SDOH) Interventions     Readmission Risk Interventions No flowsheet data found.
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
## 2021-08-12 NOTE — Progress Notes (Signed)
Physical Therapy Treatment Patient Details Name: Veronica Baker MRN: [id number] DOB: 1950/10/26 Today's Date: 08/12/2021   History of Present Illness Pt is 70 yo female s/p L anterior THA on 08/11/21.  Pt with hx including but not limited to arthritis, bipolar, fibromyalgia, HLD, HTN, neuropathy, CVA, sleep apnea    PT Comments    Patient progressing well towards PT goals. Session focused on gait training, transfers and there ex. Tolerated gait training with min A and use of RW for support; difficulty advancing RLE due to weakness and hx of residual deficits from her stroke. Instructed pt in HEP.  Pt alert and awake today, motivated to participate in therapy. Eager to get back to [place] to work with rehab with plans to return home. Will follow.   Recommendations for follow up therapy are one component of a multi-disciplinary discharge planning process, led by the attending physician.  Recommendations may be updated based on patient status, additional functional criteria and insurance authorization.  Follow Up Recommendations  Skilled nursing-short term rehab (<3 hours/day) (return to [facility]     Assistance Recommended at Discharge Frequent or constant Supervision/Assistance  Equipment Recommendations  None recommended by PT    Recommendations for Other Services       Precautions / Restrictions Precautions Precautions: Fall;Other (comment) Precaution Comments: hx of CVA affecting right side, wound vac Restrictions Weight Bearing Restrictions: Yes LLE Weight Bearing: Weight bearing as tolerated     Mobility  Bed Mobility Overal bed mobility: Needs Assistance Bed Mobility: Supine to Sit     Supine to sit: HOB elevated;Max assist     General bed mobility comments: Pt hooking RLE under LLE to get to EOB, assist to bring LEs to edge, scoot bottom and elevate trunk. RUE held close to chest as pt reports arthritis and stiffness as well as CVA on this die.     Transfers Overall transfer level: Needs assistance Equipment used: Rolling walker (2 wheels) Transfers: Sit to/from Stand Sit to Stand: Min assist           General transfer comment: Min A to power to standing with cues for hand placement/technique, Stood from EOB x1, from chair x1. Transferred to chair post ambulation.    Ambulation/Gait Ambulation/Gait assistance: Min assist Gait Distance (Feet): 5 Feet (+ 7') Assistive device: Rolling walker (2 wheels) Gait Pattern/deviations: Step-to pattern;Shuffle;Decreased dorsiflexion - right;Step-through pattern;Decreased stance time - right;Decreased step length - left Gait velocity: decreased     General Gait Details: Slow, unsteady gait with decreased foot clearance RLE and difficulty progressing it forward; right knee instability during stance, cues for RW management. Cues for upright. 1 seated rest break. HR up to 121 bom max, Sp02 100% on RA.   Stairs             Wheelchair Mobility    Modified Rankin (Stroke Patients Only)       Balance Overall balance assessment: Needs assistance Sitting-balance support: Feet supported;No upper extremity supported Sitting balance-Leahy Scale: Fair     Standing balance support: During functional activity;Reliant on assistive device for balance Standing balance-Leahy Scale: Poor Standing balance comment: Min A needed at times during walking.                            Cognition Arousal/Alertness: Awake/alert Behavior During Therapy: WFL for tasks assessed/performed Overall Cognitive Status: Within Functional Limits for tasks assessed  General Comments: Mumbling at times and soft spoken but awake and alert today, answering questions appropriately.        Exercises Total Joint Exercises Ankle Circles/Pumps: AROM;Both;10 reps;Supine Quad Sets: AROM;Both;10 reps;Supine Towel Squeeze: Strengthening;Both;10  reps;Seated Hip ABduction/ADduction: AROM;Left;10 reps;Seated Long Arc Quad: AROM;Left;10 reps;Seated    General Comments General comments (skin integrity, edema, etc.): HR up to 121 bpm max with activity. SP02 remained 100% on RA.      Pertinent Vitals/Pain Pain Assessment: No/denies pain    Home Living                          Prior Function            PT Goals (current goals can now be found in the care plan section) Progress towards PT goals: Progressing toward goals    Frequency    7X/week      PT Plan Current plan remains appropriate    Co-evaluation              AM-PAC PT "6 Clicks" Mobility   Outcome Measure  Help needed turning from your back to your side while in a flat bed without using bedrails?: A Lot Help needed moving from lying on your back to sitting on the side of a flat bed without using bedrails?: Total Help needed moving to and from a bed to a chair (including a wheelchair)?: A Little Help needed standing up from a chair using your arms (e.g., wheelchair or bedside chair)?: A Little Help needed to walk in hospital room?: Total Help needed climbing 3-5 steps with a railing? : Total 6 Click Score: 11    End of Session Equipment Utilized During Treatment: Gait belt Activity Tolerance: Patient tolerated treatment well Patient left: in chair;with call bell/phone within reach;with chair alarm set Nurse Communication: Mobility status PT Visit Diagnosis: Other abnormalities of gait and mobility (R26.89);Muscle weakness (generalized) (M62.81)     Time: 7867-5449 PT Time Calculation (min) (ACUTE ONLY): 26 min  Charges:  $Gait Training: 8-22 mins $Therapeutic Exercise: 8-22 mins                     Vale Haven, PT, DPT Acute Rehabilitation Services Pager [phone] Office [phone]      Blake Divine A [facility]ier [facility] 08/12/2021, 12:08 PM

## 2021-08-12 NOTE — Progress Notes (Signed)
Subjective: 1 Day Post-Op Procedure(s) (LRB): LEFT TOTAL HIP ARTHROPLASTY ANTERIOR APPROACH (Left) APPLICATION OF WOUND VAC (Right) Patient reports pain as mild.  Denies lightheadedness/dizziness/chest pain/pressure/palpitations  Objective: Vital signs in last 24 hours: Temp:  [97.2 F (36.2 C)-98.6 F (37 C)] 98 F (36.7 C) (12/13 0516) Pulse Rate:  [81-112] 90 (12/13 0516) Resp:  [10-17] 16 (12/13 0516) BP: (97-142)/(60-97) 114/70 (12/13 0516) SpO2:  [93 %-100 %] 98 % (12/13 0516)  Intake/Output from previous day: 12/12 0701 - 12/13 0700 In: 970.4 [I.V.:970.4] Out: 600 [Urine:500; Blood:100] Intake/Output this shift: No intake/output data recorded.  Recent Labs    08/11/21 0537 08/12/21 0355  HGB 11.1* 8.5*   Recent Labs    08/11/21 0537 08/12/21 0355  WBC 5.3 8.8  RBC 4.05 3.21*  HCT 35.6* 27.4*  PLT 178 153   Recent Labs    08/11/21 0537 08/12/21 0355  NA 137 131*  K 3.6 4.5  CL 100 99  CO2 28 25  BUN 13 21  CREATININE 0.86 1.10*  GLUCOSE 101* 157*  CALCIUM 9.5 8.8*   No results for input(s): LABPT, INR in the last 72 hours.  Neurologically intact Neurovascular intact Sensation intact distally Intact pulses distally Dorsiflexion/Plantar flexion intact Incision: dressing C/D/I No cellulitis present Compartment soft Wound vac in place without any fluid in canister   Assessment/Plan: 1 Day Post-Op Procedure(s) (LRB): LEFT TOTAL HIP ARTHROPLASTY ANTERIOR APPROACH (Left) APPLICATION OF WOUND VAC (Right) Discharge to SNF once ok by [facility] to return ABLA on chronic anemia- asymptomatic this am WBAT LLE Please remove hospital vac canister and replace with prevena canister DAY OF DISCHARGE.   F/u with Dr. Roda Shutters in one week for prevena removal      Cristie Hem 08/12/2021, 7:50 AM
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
## 2021-08-12 NOTE — Progress Notes (Signed)
This nurse called report to [phone] and gave report to Alvis Lemmings, RN @[phone] . still has not arrived to pick up pt. Discharge packet is ready. Nurse Sharin Mons made aware.
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
## 2021-08-12 NOTE — Anesthesia Postprocedure Evaluation (Signed)
Anesthesia Post Note  Patient: TAMRA KOOS  Procedure(s) Performed: LEFT TOTAL HIP ARTHROPLASTY ANTERIOR APPROACH (Left: Hip) APPLICATION OF WOUND VAC (Right: Hip)     Patient location during evaluation: PACU Anesthesia Type: General Level of consciousness: patient cooperative and awake Pain management: pain level controlled Vital Signs Assessment: post-procedure vital signs reviewed and stable Respiratory status: spontaneous breathing, nonlabored ventilation, respiratory function stable and patient connected to nasal cannula oxygen Cardiovascular status: blood pressure returned to baseline and stable Postop Assessment: no apparent nausea or vomiting Anesthetic complications: no   No notable events documented.  Last Vitals:  Vitals:   08/12/21 0516 08/12/21 0840  BP: 114/70 118/74  Pulse: 90 95  Resp: 16 17  Temp: 36.7 C 36.6 C  SpO2: 98% 100%    Last Pain:  Vitals:   08/12/21 1100  TempSrc:   PainSc: 0-No pain                 Zahir Eisenhour

## 2021-08-13 ENCOUNTER — Encounter: Payer: Self-pay | Admitting: Adult Health

## 2021-08-13 ENCOUNTER — Non-Acute Institutional Stay (SKILLED_NURSING_FACILITY): Payer: Medicare Other | Admitting: Adult Health

## 2021-08-13 DIAGNOSIS — M1612 Unilateral primary osteoarthritis, left hip: Secondary | ICD-10-CM | POA: Diagnosis not present

## 2021-08-13 DIAGNOSIS — J4540 Moderate persistent asthma, uncomplicated: Secondary | ICD-10-CM

## 2021-08-13 DIAGNOSIS — I5032 Chronic diastolic (congestive) heart failure: Secondary | ICD-10-CM

## 2021-08-13 DIAGNOSIS — F3130 Bipolar disorder, current episode depressed, mild or moderate severity, unspecified: Secondary | ICD-10-CM

## 2021-08-13 DIAGNOSIS — I825Z2 Chronic embolism and thrombosis of unspecified deep veins of left distal lower extremity: Secondary | ICD-10-CM

## 2021-08-13 DIAGNOSIS — G629 Polyneuropathy, unspecified: Secondary | ICD-10-CM

## 2021-08-13 NOTE — Progress Notes (Signed)
Patient just got picked up by [facility] to go to [facility], all belongings were given to the patient as well as her pocketbook and cell phone. The family has to pick up the wheelchair and drop it off to the facility as the [facility] cannot safely transport the wheelchair. RN called the facility for the latest patient update and will call family regarding the transfer and the wheelchair that needs to get picked up

## 2021-08-13 NOTE — Progress Notes (Signed)
Patient still has not been picked up by [facility]. RN called the ER to obtain the [facility] number [phone] for scheduled pick up. The employee who answered the [facility] phone number stated that [facility] is technically closed already for the night and therefore all the patients that needed to be picked up by [facility] were forwarded to them. But their service is geared towards emergency picked up and will pick up the non emergency patient from the hospital when time permits. So, at this time the patient is waiting, resting in bed with eyes closed.

## 2021-08-13 NOTE — Progress Notes (Addendum)
[facility] was called at approximately 2100 to check when the patient will be picked up, [facility] employee said in an hour and a half that was at 2100. RN gave patient all the medications scheduled for the night including PRN pain medicine as patient requested it. Patient's family was getting upset that it is getting very late for the patient to get picked by transport and wanted the patient to just refuse to be transferred for tonight and wait until in the morning. Patient wants to wait for the transport. RN will call back [facility] again for update.
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
## 2021-08-14 ENCOUNTER — Non-Acute Institutional Stay (SKILLED_NURSING_FACILITY): Payer: Medicare Other | Admitting: Internal Medicine

## 2021-08-14 ENCOUNTER — Encounter: Payer: Self-pay | Admitting: Internal Medicine

## 2021-08-14 DIAGNOSIS — M1612 Unilateral primary osteoarthritis, left hip: Secondary | ICD-10-CM | POA: Diagnosis not present

## 2021-08-14 DIAGNOSIS — N179 Acute kidney failure, unspecified: Secondary | ICD-10-CM | POA: Diagnosis not present

## 2021-08-14 DIAGNOSIS — D62 Acute posthemorrhagic anemia: Secondary | ICD-10-CM

## 2021-08-14 NOTE — Progress Notes (Signed)
NURSING HOME LOCATION:  [facility] ROOM NUMBER:   [id number]  CODE STATUS:  Full Code  PCP:  Abran Duke MD  This is a comprehensive admission note to this SNFperformed on this date less than 30 days from date of admission. Included are preadmission medical/surgical history; reconciled medication list; family history; social history and comprehensive review of systems.  Corrections and additions to the records were documented. Comprehensive physical exam was also performed. Additionally a clinical summary was entered for each active diagnosis pertinent to this admission in the Problem List to enhance continuity of care.  HPI: Patient was hospitalized 12/12 - 08/12/2021 for elective total left hip replacement for primary osteoarthritis by Dr Roda Shutters.  The condition was associated with severe unremitting pain affecting all activities of daily living, sleep, and work/hobbies. Postop sequential compression devices were initiated with early ambulation and Eliquis DVT chemoprophylaxis.  Wound VAC was applied intraoperatively. Course was complicated by acute blood loss anemia.  Preop H/H was 11.1/35.6 with postop values of 8.5/27.4.  Indices were normochromic, normocytic.  She did have a slight AKI with creatinine increasing from 0.86 up to 1.10 and GFR dropping from greater than 60 to 55 indicating CKD stage IIIa.  While hospitalized glucoses ranged from 101 up to 157.  The most recent A1c on record was 03/06/2021 with a value of 5.5% indicating prediabetes.  She did develop asymptomatic hyponatremia while hospitalized with a sodium dropping from 137 down to 131.  This may have been dilutional. She was felt stable enough to return to the SNF for rehab.  Past medical and surgical history: Includes history of asthma, bipolar disorder, polyclonal gammopathy, fibromyalgia, dyslipidemia, essential hypertension, neuropathy, history of sarcoidosis, history of stroke, and sleep  apnea. Surgeries and procedures include TAH and BSO, breast lumpectomy, cardiac cath, and umbilical hernia repair.  Social history: non drinker; non smoker  Family history: reviewed   Review of systems: She states that the pain in the hip has decreased from a level of 10 down to a level 3.  She also has pain in the left heel.  She denies any other active symptoms at this time.  Constitutional: No fever, significant weight change, fatigue  Eyes: No redness, discharge, pain, vision change ENT/mouth: No nasal congestion, purulent discharge, earache, change in hearing, sore throat  Cardiovascular: No chest pain, palpitations, paroxysmal nocturnal dyspnea, claudication, edema  Respiratory: No cough, sputum production, hemoptysis, DOE, significant snoring, apnea Gastrointestinal: No heartburn, dysphagia, abdominal pain, nausea /vomiting, rectal bleeding, melena, change in bowels Genitourinary: No dysuria, hematuria, pyuria, incontinence, nocturia Dermatologic: No rash, pruritus, change in appearance of skin Neurologic: No dizziness, headache, syncope, seizures, numbness, tingling Psychiatric: No significant anxiety, depression, insomnia, anorexia Endocrine: No change in hair/skin/nails, excessive thirst, excessive hunger, excessive urination  Hematologic/lymphatic: No significant bruising, lymphadenopathy, abnormal bleeding Allergy/immunology: No itchy/watery eyes, significant sneezing, urticaria, angioedema  Physical exam:  Pertinent or positive findings: She exhibits somewhat of a baby talk character to her speech and it is difficult to understand her clearly.  There is slight exotropia of the right eye.  There is a gap of the anterior maxillary teeth.  She is missing multiple mandible teeth.  First and second heart sounds are accentuated.  Abdomen is protuberant.  Pedal pulses are decreased.  Her feet are puffy without pitting to palpation.  Dressing is present over the left hip as is wound  VAC.  General appearance: Adequately nourished; no acute distress, increased work of breathing is present.  Lymphatic: No lymphadenopathy about the head, neck, axilla. Eyes: No conjunctival inflammation or lid edema is present. There is no scleral icterus. Ears:  External ear exam shows no significant lesions or deformities.   Nose:  External nasal examination shows no deformity or inflammation. Nasal mucosa are pink and moist without lesions, exudates Neck:  No thyromegaly, masses, tenderness noted.    Heart:  Normal rate and regular rhythm without gallop, murmur, click, rub.  Lungs: Chest clear to auscultation without wheezes, rhonchi, rales, rubs. Abdomen: Bowel sounds are normal.  Abdomen is soft and nontender with no organomegaly, hernias, masses. GU: Deferred  Extremities:  No cyanosis, clubbing Neurologic exam:  Balance, Rhomberg, finger to nose testing could not be completed due to clinical state Skin: Warm & dry w/o tenting. No significant lesions or rash.  See clinical summary under each active problem in the Problem List with associated updated therapeutic plan

## 2021-08-14 NOTE — Assessment & Plan Note (Addendum)
12/12 - 08/12/2021 preop creatinine 0.86/GFR greater than 60.  Mild AKI with creatinine 1.10 and GFR 55 indicating high stage IIIa CKD.  Gabapentin is low-dose and no dose adjustment is indicated.

## 2021-08-14 NOTE — Assessment & Plan Note (Signed)
Monitor for bleeding at the SNF as on Eliquis DVT prophylaxis.

## 2021-08-14 NOTE — Assessment & Plan Note (Addendum)
Subjectively hip pain has decreased from 10 to 3. PT/OT at SNF.  Orthopedic follow-up 12/28

## 2021-08-14 NOTE — Patient Instructions (Signed)
See assessment and plan under each diagnosis in the problem list and acutely for this visit 

## 2021-08-16 ENCOUNTER — Other Ambulatory Visit: Payer: Self-pay | Admitting: Adult Health

## 2021-08-16 MED ORDER — OXYCODONE HCL 5 MG PO TABS: 5.0000 mg | ORAL_TABLET | Freq: Four times a day (QID) | ORAL | 0 refills | Status: DC | PRN

## 2021-08-19 ENCOUNTER — Telehealth (HOSPITAL_COMMUNITY): Payer: Self-pay

## 2021-08-21 LAB — BASIC METABOLIC PANEL
BUN: 18 (ref 4–21)
CO2: 28 — AB (ref 13–22)
Chloride: 99 (ref 99–108)
Creatinine: 0.8 (ref 0.5–1.1)
Glucose: 97
Potassium: 4.3 (ref 3.4–5.3)
Sodium: 137 (ref 137–147)

## 2021-08-21 LAB — COMPREHENSIVE METABOLIC PANEL
Calcium: 9 (ref 8.7–10.7)
GFR calc Af Amer: 87.58
GFR calc non Af Amer: 75.57

## 2021-08-21 LAB — CBC AND DIFFERENTIAL
HCT: 25 — AB (ref 36–46)
Hemoglobin: 7.7 — AB (ref 12.0–16.0)
Platelets: 303 (ref 150–399)
WBC: 7.9

## 2021-08-21 LAB — CBC: RBC: 3 — AB (ref 3.87–5.11)

## 2021-08-22 ENCOUNTER — Encounter: Payer: Self-pay | Admitting: Adult Health

## 2021-08-22 ENCOUNTER — Non-Acute Institutional Stay (SKILLED_NURSING_FACILITY): Payer: Medicare Other | Admitting: Adult Health

## 2021-08-22 DIAGNOSIS — M1612 Unilateral primary osteoarthritis, left hip: Secondary | ICD-10-CM

## 2021-08-22 DIAGNOSIS — R600 Localized edema: Secondary | ICD-10-CM | POA: Diagnosis not present

## 2021-08-22 DIAGNOSIS — F319 Bipolar disorder, unspecified: Secondary | ICD-10-CM

## 2021-08-22 DIAGNOSIS — D62 Acute posthemorrhagic anemia: Secondary | ICD-10-CM
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
## 2021-08-27 ENCOUNTER — Other Ambulatory Visit: Payer: Self-pay

## 2021-08-27 ENCOUNTER — Other Ambulatory Visit: Payer: Self-pay | Admitting: Physician Assistant

## 2021-08-27 ENCOUNTER — Ambulatory Visit (INDEPENDENT_AMBULATORY_CARE_PROVIDER_SITE_OTHER): Payer: Medicare Other | Admitting: Physician Assistant

## 2021-08-27 ENCOUNTER — Encounter: Payer: Self-pay | Admitting: Orthopaedic Surgery

## 2021-08-27 DIAGNOSIS — Z96642 Presence of left artificial hip joint: Secondary | ICD-10-CM
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
## 2021-09-03 ENCOUNTER — Non-Acute Institutional Stay (SKILLED_NURSING_FACILITY): Payer: Medicare Other | Admitting: Adult Health

## 2021-09-03 ENCOUNTER — Encounter: Payer: Self-pay | Admitting: Adult Health

## 2021-09-03 DIAGNOSIS — N3000 Acute cystitis without hematuria: Secondary | ICD-10-CM

## 2021-09-03 DIAGNOSIS — M1612 Unilateral primary osteoarthritis, left hip: Secondary | ICD-10-CM

## 2021-09-03 MED ORDER — NITROFURANTOIN MONOHYD MACRO 100 MG PO CAPS: 100.0000 mg | ORAL_CAPSULE | Freq: Two times a day (BID) | ORAL | 0 refills | Status: AC
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
## 2021-09-04 LAB — BASIC METABOLIC PANEL
BUN: 13 (ref 4–21)
CO2: 27 — AB (ref 13–22)
Chloride: 104 (ref 99–108)
Creatinine: 0.7 (ref 0.5–1.1)
Glucose: 114
Potassium: 4.5 (ref 3.4–5.3)
Sodium: 140 (ref 137–147)

## 2021-09-04 LAB — COMPREHENSIVE METABOLIC PANEL: Calcium: 9.5 (ref 8.7–10.7)

## 2021-09-04 LAB — CBC AND DIFFERENTIAL
HCT: 27 — AB (ref 36–46)
Hemoglobin: 8.1 — AB (ref 12.0–16.0)
Platelets: 321 (ref 150–399)
WBC: 5.5

## 2021-09-04 LAB — CBC: RBC: 3.31 — AB (ref 3.87–5.11)

## 2021-09-05 LAB — CBC AND DIFFERENTIAL
HCT: 26 — AB (ref 36–46)
Hemoglobin: 8.4 — AB (ref 12.0–16.0)
Platelets: 305 (ref 150–399)
WBC: 5.5

## 2021-09-05 LAB — CBC: RBC: 3.31 — AB (ref 3.87–5.11)

## 2021-09-10 ENCOUNTER — Non-Acute Institutional Stay (SKILLED_NURSING_FACILITY): Payer: Commercial Managed Care - HMO | Admitting: Adult Health

## 2021-09-10 ENCOUNTER — Encounter: Payer: Self-pay | Admitting: Adult Health

## 2021-09-10 DIAGNOSIS — G629 Polyneuropathy, unspecified: Secondary | ICD-10-CM

## 2021-09-10 DIAGNOSIS — M1612 Unilateral primary osteoarthritis, left hip: Secondary | ICD-10-CM

## 2021-09-10 DIAGNOSIS — R600 Localized edema: Secondary | ICD-10-CM

## 2021-09-10 DIAGNOSIS — N3000 Acute cystitis without hematuria: Secondary | ICD-10-CM | POA: Diagnosis not present

## 2021-09-10 DIAGNOSIS — I825Z2 Chronic embolism and thrombosis of unspecified deep veins of left distal lower extremity: Secondary | ICD-10-CM
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
## 2021-09-11 ENCOUNTER — Other Ambulatory Visit: Payer: Self-pay | Admitting: Adult Health

## 2021-09-11 MED ORDER — OXYCODONE HCL 5 MG PO TABS: 5.0000 mg | ORAL_TABLET | Freq: Four times a day (QID) | ORAL | 0 refills | Status: DC | PRN

## 2021-09-24 ENCOUNTER — Other Ambulatory Visit: Payer: Self-pay

## 2021-09-24 ENCOUNTER — Encounter: Payer: Self-pay | Admitting: Adult Health

## 2021-09-24 ENCOUNTER — Non-Acute Institutional Stay (SKILLED_NURSING_FACILITY): Payer: Medicare Other | Admitting: Adult Health

## 2021-09-24 ENCOUNTER — Ambulatory Visit (INDEPENDENT_AMBULATORY_CARE_PROVIDER_SITE_OTHER): Payer: Medicare Other | Admitting: Orthopaedic Surgery

## 2021-09-24 ENCOUNTER — Ambulatory Visit (INDEPENDENT_AMBULATORY_CARE_PROVIDER_SITE_OTHER): Payer: Medicare Other

## 2021-09-24 ENCOUNTER — Encounter: Payer: Self-pay | Admitting: Orthopaedic Surgery

## 2021-09-24 DIAGNOSIS — I5032 Chronic diastolic (congestive) heart failure: Secondary | ICD-10-CM

## 2021-09-24 DIAGNOSIS — M1612 Unilateral primary osteoarthritis, left hip: Secondary | ICD-10-CM

## 2021-09-24 DIAGNOSIS — Z96642 Presence of left artificial hip joint: Secondary | ICD-10-CM

## 2021-09-24 DIAGNOSIS — G629 Polyneuropathy, unspecified: Secondary | ICD-10-CM

## 2021-09-24 DIAGNOSIS — D62 Acute posthemorrhagic anemia: Secondary | ICD-10-CM

## 2021-09-24 DIAGNOSIS — I825Z2 Chronic embolism and thrombosis of unspecified deep veins of left distal lower extremity: Secondary | ICD-10-CM

## 2021-09-24 DIAGNOSIS — F319 Bipolar disorder, unspecified: Secondary | ICD-10-CM

## 2021-09-24 DIAGNOSIS — J4540 Moderate persistent asthma, uncomplicated: Secondary | ICD-10-CM
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
## 2021-09-26 MED ORDER — ROSUVASTATIN CALCIUM 20 MG PO TABS: 20.0000 mg | ORAL_TABLET | Freq: Every day | ORAL | 0 refills | Status: AC

## 2021-09-26 MED ORDER — LATANOPROST 0.005 % OP SOLN: 1.0000 [drp] | Freq: Every day | OPHTHALMIC | 1 refills | Status: AC

## 2021-09-26 MED ORDER — METHOCARBAMOL 500 MG PO TABS: 500.0000 mg | ORAL_TABLET | Freq: Two times a day (BID) | ORAL | 0 refills | Status: DC | PRN

## 2021-09-26 MED ORDER — ALBUTEROL SULFATE HFA 108 (90 BASE) MCG/ACT IN AERS: 1.0000 | INHALATION_SPRAY | Freq: Four times a day (QID) | RESPIRATORY_TRACT | 0 refills | Status: AC | PRN

## 2021-09-26 MED ORDER — LIDOCAINE 5 % EX PTCH: 1.0000 | MEDICATED_PATCH | CUTANEOUS | 0 refills | Status: AC

## 2021-09-26 MED ORDER — FUROSEMIDE 20 MG PO TABS: 20.0000 mg | ORAL_TABLET | Freq: Every day | ORAL | 0 refills | Status: AC

## 2021-09-26 MED ORDER — FERROUS SULFATE 325 (65 FE) MG PO TABS: 325.0000 mg | ORAL_TABLET | Freq: Two times a day (BID) | ORAL | 0 refills | Status: AC

## 2021-09-26 MED ORDER — OXYCODONE HCL 5 MG PO TABS: 5.0000 mg | ORAL_TABLET | Freq: Four times a day (QID) | ORAL | 0 refills | Status: DC | PRN

## 2021-09-26 MED ORDER — GABAPENTIN 100 MG PO CAPS: 200.0000 mg | ORAL_CAPSULE | Freq: Every day | ORAL | 0 refills | Status: DC

## 2021-09-26 MED ORDER — WIXELA INHUB 250-50 MCG/ACT IN AEPB: 1.0000 | INHALATION_SPRAY | Freq: Two times a day (BID) | RESPIRATORY_TRACT | 0 refills | Status: AC

## 2021-09-26 MED ORDER — APIXABAN 5 MG PO TABS: 5.0000 mg | ORAL_TABLET | Freq: Two times a day (BID) | ORAL | 0 refills | Status: DC

## 2021-09-26 MED ORDER — LORATADINE 10 MG PO TABS: 10.0000 mg | ORAL_TABLET | Freq: Every day | ORAL | 0 refills | Status: AC

## 2021-09-26 MED ORDER — CITALOPRAM HYDROBROMIDE 20 MG PO TABS: 20.0000 mg | ORAL_TABLET | Freq: Every day | ORAL | 0 refills | Status: DC

## 2021-09-26 MED ORDER — GABAPENTIN 100 MG PO CAPS: 100.0000 mg | ORAL_CAPSULE | Freq: Two times a day (BID) | ORAL | 0 refills | Status: AC

## 2021-09-29 ENCOUNTER — Telehealth: Payer: Self-pay | Admitting: Orthopaedic Surgery

## 2021-09-29 NOTE — Telephone Encounter (Signed)
Received cal from Garrel Ridgel with [facility] needing verbal orders to continue HHPT  2 Wk 3,1 Wk 5, add nursing (meds are all messed up) and add medical social worker. The number to contact [facility] Kidney is [phone]

## 2021-09-29 NOTE — Telephone Encounter (Signed)
Called to approve orders 

## 2021-10-18 ENCOUNTER — Other Ambulatory Visit: Payer: Self-pay | Admitting: Adult Health

## 2021-10-18 DIAGNOSIS — I5032 Chronic diastolic (congestive) heart failure: Secondary | ICD-10-CM

## 2021-10-18 DIAGNOSIS — J4540 Moderate persistent asthma, uncomplicated: Secondary | ICD-10-CM

## 2021-10-18 DIAGNOSIS — F319 Bipolar disorder, unspecified: Secondary | ICD-10-CM

## 2021-10-18 DIAGNOSIS — M1612 Unilateral primary osteoarthritis, left hip: Secondary | ICD-10-CM

## 2021-10-28 ENCOUNTER — Telehealth: Payer: Self-pay | Admitting: Orthopaedic Surgery

## 2021-10-28 NOTE — Telephone Encounter (Signed)
FYI

## 2021-10-28 NOTE — Telephone Encounter (Signed)
Bertha Stakes (PT) called from [facility] with an [place]. Pt fell on Sunday. Pt has no complaints of pain. No call back needed. Debra phone number is [phone] if any questions arise.

## 2021-10-29 NOTE — Telephone Encounter (Signed)
Ok thanks 

## 2021-11-05 ENCOUNTER — Ambulatory Visit: Payer: Medicare Other | Admitting: Orthopaedic Surgery

## 2021-11-11 ENCOUNTER — Ambulatory Visit (INDEPENDENT_AMBULATORY_CARE_PROVIDER_SITE_OTHER): Payer: Medicare Other | Admitting: Orthopaedic Surgery

## 2021-11-11 ENCOUNTER — Other Ambulatory Visit: Payer: Self-pay

## 2021-11-11 ENCOUNTER — Ambulatory Visit (INDEPENDENT_AMBULATORY_CARE_PROVIDER_SITE_OTHER): Payer: Medicare Other

## 2021-11-11 ENCOUNTER — Encounter: Payer: Self-pay | Admitting: Orthopaedic Surgery

## 2021-11-11 DIAGNOSIS — Z96642 Presence of left artificial hip joint: Secondary | ICD-10-CM

## 2021-11-11 MED ORDER — ACETAMINOPHEN-CODEINE #3 300-30 MG PO TABS: 1.0000 | ORAL_TABLET | Freq: Three times a day (TID) | ORAL | 0 refills | Status: DC | PRN
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
## 2021-11-13 ENCOUNTER — Telehealth: Payer: Self-pay | Admitting: Orthopaedic Surgery

## 2021-11-13 NOTE — Telephone Encounter (Signed)
Cala Bradford (OT) from [facility] called requesting verbal orders for extended OT for 2 wk. Cala Bradford is asking for this to be done today if possibly. She states she has been sick and sees pt tomorrow. Please call pt at [phone]. ?

## 2021-11-13 NOTE — Telephone Encounter (Signed)
Called and approved orders.  

## 2021-11-19 ENCOUNTER — Telehealth (INDEPENDENT_AMBULATORY_CARE_PROVIDER_SITE_OTHER): Payer: Medicare Other | Admitting: Psychiatry

## 2021-11-19 ENCOUNTER — Encounter: Payer: Self-pay | Admitting: Psychiatry

## 2021-11-19 DIAGNOSIS — F5105 Insomnia due to other mental disorder: Secondary | ICD-10-CM | POA: Diagnosis not present

## 2021-11-19 DIAGNOSIS — F319 Bipolar disorder, unspecified: Secondary | ICD-10-CM

## 2021-11-19 DIAGNOSIS — F411 Generalized anxiety disorder: Secondary | ICD-10-CM | POA: Diagnosis not present

## 2021-11-19 DIAGNOSIS — F251 Schizoaffective disorder, depressive type: Secondary | ICD-10-CM | POA: Diagnosis not present

## 2021-11-19 MED ORDER — CITALOPRAM HYDROBROMIDE 20 MG PO TABS: 20.0000 mg | ORAL_TABLET | Freq: Every day | ORAL | 3 refills | Status: DC
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
## 2022-02-18 ENCOUNTER — Ambulatory Visit (INDEPENDENT_AMBULATORY_CARE_PROVIDER_SITE_OTHER): Payer: Medicare Other | Admitting: Orthopaedic Surgery

## 2022-02-18 ENCOUNTER — Ambulatory Visit (INDEPENDENT_AMBULATORY_CARE_PROVIDER_SITE_OTHER): Payer: Medicare Other

## 2022-02-18 ENCOUNTER — Encounter: Payer: Self-pay | Admitting: Orthopaedic Surgery

## 2022-02-18 DIAGNOSIS — Z96642 Presence of left artificial hip joint: Secondary | ICD-10-CM | POA: Insufficient documentation

## 2022-02-18 DIAGNOSIS — M1611 Unilateral primary osteoarthritis, right hip: Secondary | ICD-10-CM | POA: Insufficient documentation
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
## 2022-08-20 ENCOUNTER — Ambulatory Visit (INDEPENDENT_AMBULATORY_CARE_PROVIDER_SITE_OTHER): Payer: Medicare Other | Admitting: Orthopaedic Surgery

## 2022-08-20 ENCOUNTER — Ambulatory Visit (INDEPENDENT_AMBULATORY_CARE_PROVIDER_SITE_OTHER): Payer: Medicare Other

## 2022-08-20 VITALS — Ht 60.0 in | Wt 186.0 lb

## 2022-08-20 DIAGNOSIS — Z96642 Presence of left artificial hip joint: Secondary | ICD-10-CM

## 2022-08-20 DIAGNOSIS — M1611 Unilateral primary osteoarthritis, right hip: Secondary | ICD-10-CM | POA: Diagnosis not present
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
## 2022-08-30 IMAGING — DX DG TIBIA/FIBULA 2V*L*
3 series · 3 of 3 positions shown · non-contrast
Comparison: None.

CLINICAL DATA: Fall, pain

EXAM:
LEFT TIBIA AND FIBULA - 2 VIEW

[tibia ap]
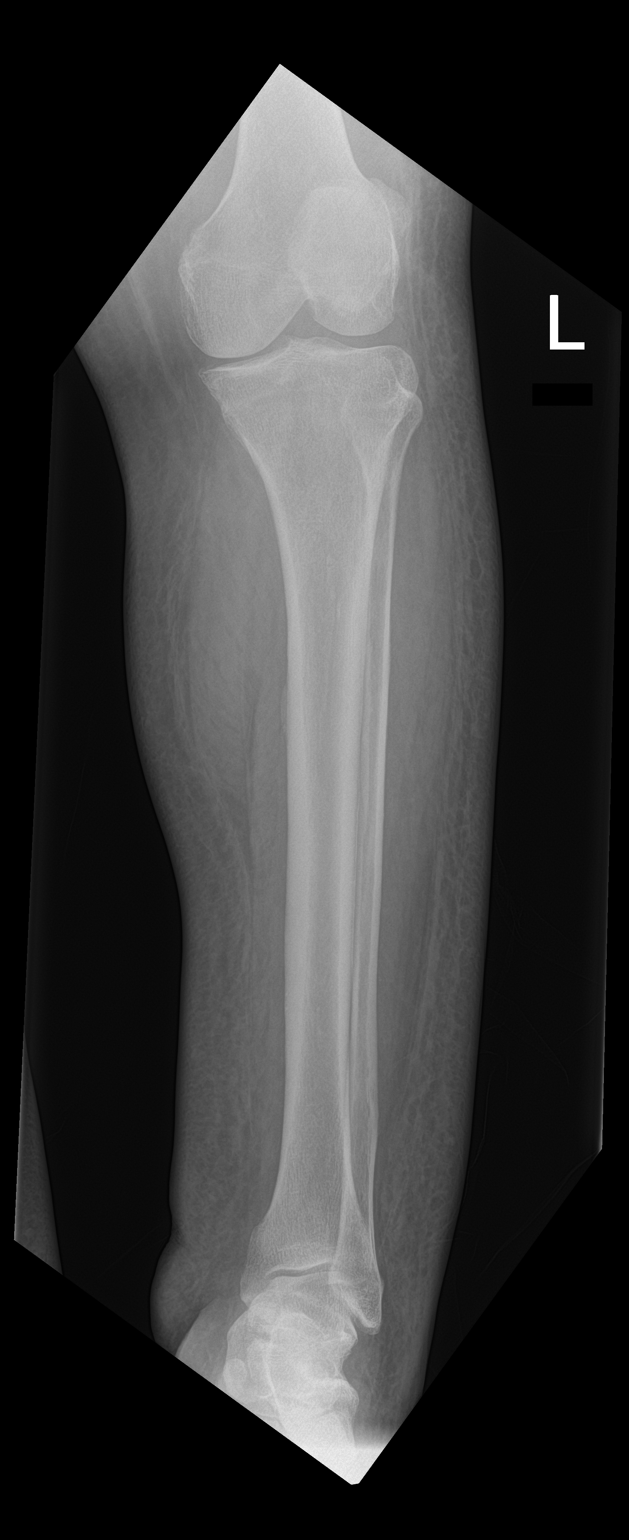

[tibia lat (1 of 2)]
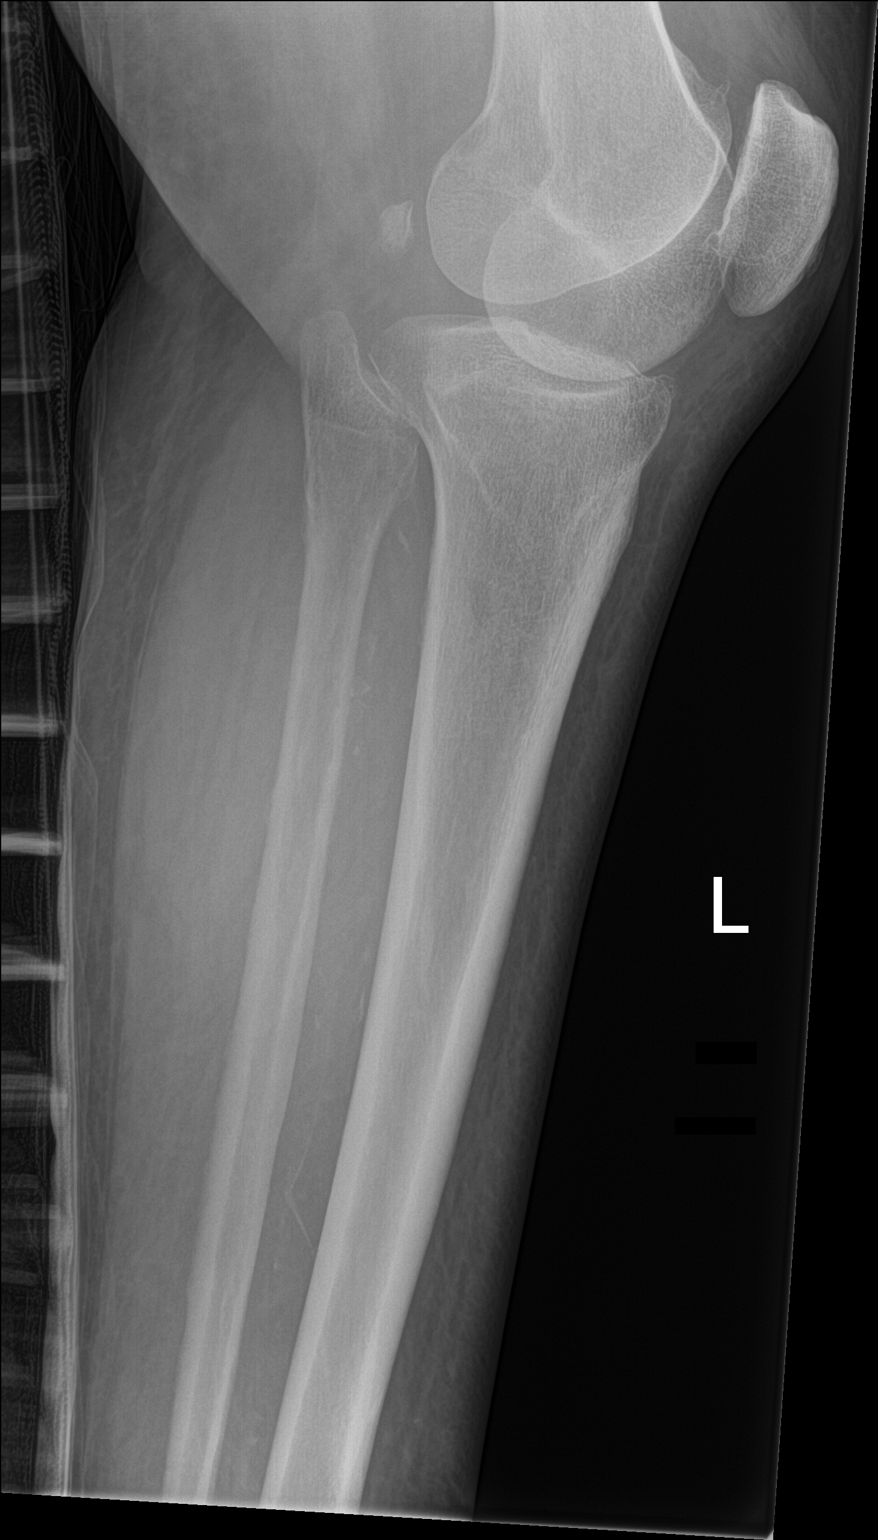

[tibia lat (2 of 2)]
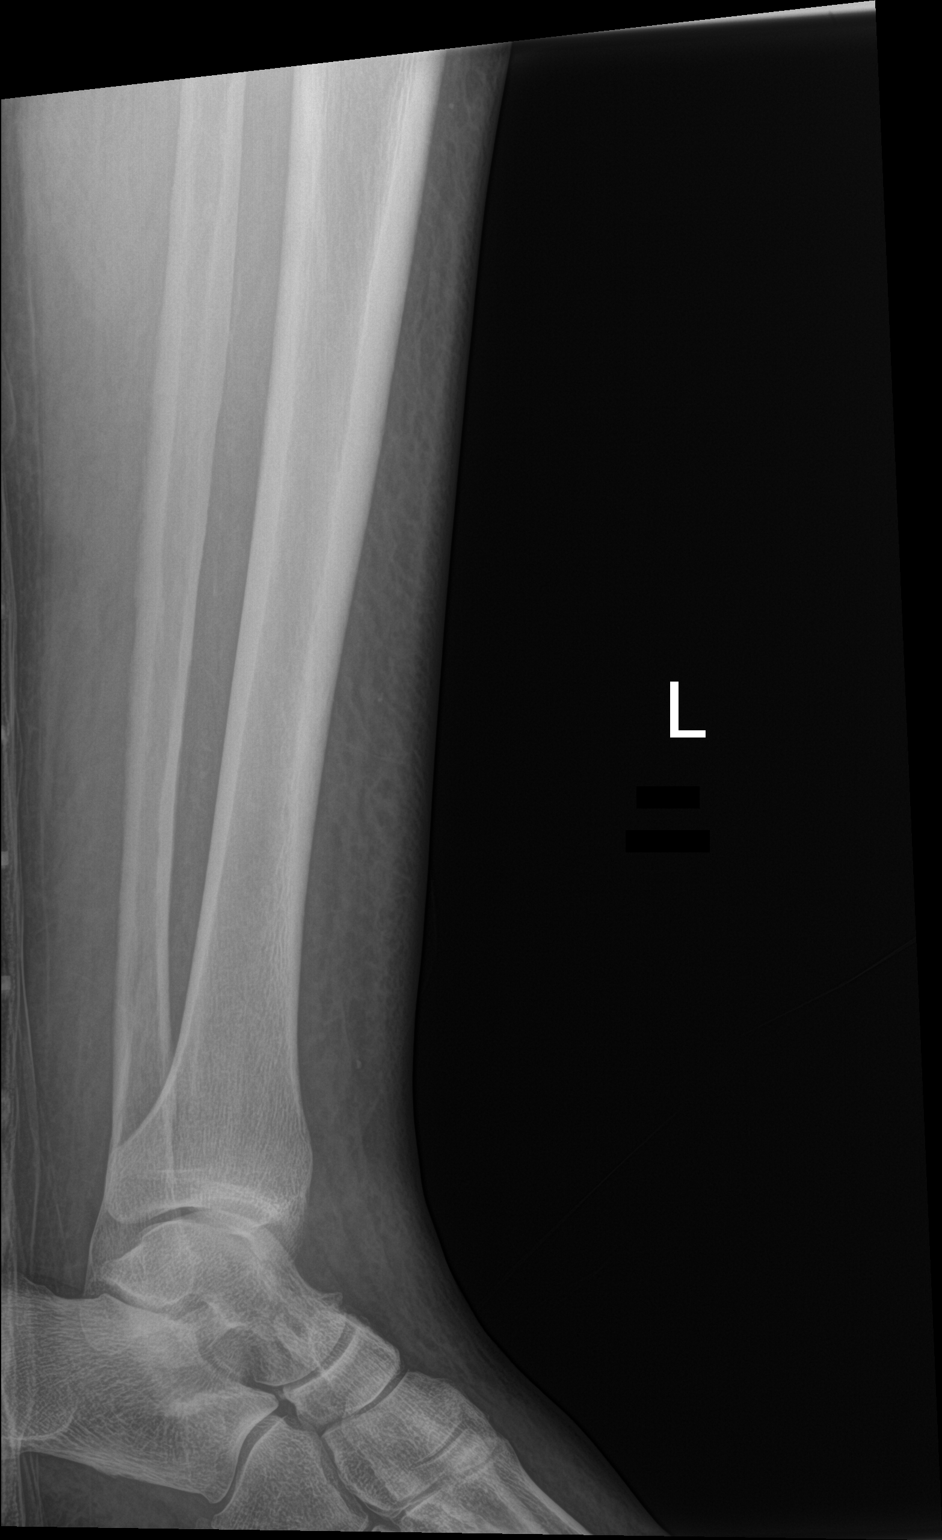

[3 of 3 positions shown; findings below may reference images not displayed]

FINDINGS: There is no evidence of fracture or other focal bone lesions. Soft
tissue edema.
IMPRESSION: No acute fracture.

## 2022-09-21 ENCOUNTER — Other Ambulatory Visit: Payer: Self-pay | Admitting: Psychiatry

## 2022-09-21 DIAGNOSIS — F319 Bipolar disorder, unspecified: Secondary | ICD-10-CM

## 2022-11-17 ENCOUNTER — Other Ambulatory Visit: Payer: Self-pay | Admitting: Psychiatry

## 2022-11-17 DIAGNOSIS — F319 Bipolar disorder, unspecified: Secondary | ICD-10-CM

## 2022-11-18 NOTE — Telephone Encounter (Signed)
Please call to schedule an appt, has yearly F/U but is due now.

## 2022-11-19 NOTE — Telephone Encounter (Signed)
Pt is scheduled for 12/30/22

## 2022-12-08 ENCOUNTER — Other Ambulatory Visit (INDEPENDENT_AMBULATORY_CARE_PROVIDER_SITE_OTHER): Payer: 59

## 2022-12-08 ENCOUNTER — Ambulatory Visit (INDEPENDENT_AMBULATORY_CARE_PROVIDER_SITE_OTHER): Payer: 59 | Admitting: Orthopaedic Surgery

## 2022-12-08 DIAGNOSIS — M1611 Unilateral primary osteoarthritis, right hip: Secondary | ICD-10-CM | POA: Diagnosis not present
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
## 2022-12-30 ENCOUNTER — Ambulatory Visit (INDEPENDENT_AMBULATORY_CARE_PROVIDER_SITE_OTHER): Payer: 59 | Admitting: Psychiatry

## 2022-12-30 ENCOUNTER — Encounter: Payer: Self-pay | Admitting: Psychiatry

## 2022-12-30 DIAGNOSIS — F5105 Insomnia due to other mental disorder: Secondary | ICD-10-CM

## 2022-12-30 DIAGNOSIS — F319 Bipolar disorder, unspecified: Secondary | ICD-10-CM | POA: Diagnosis not present

## 2022-12-30 DIAGNOSIS — F411 Generalized anxiety disorder: Secondary | ICD-10-CM | POA: Diagnosis not present

## 2022-12-30 DIAGNOSIS — F251 Schizoaffective disorder, depressive type: Secondary | ICD-10-CM | POA: Diagnosis not present

## 2022-12-30 MED ORDER — CITALOPRAM HYDROBROMIDE 20 MG PO TABS: 20.0000 mg | ORAL_TABLET | Freq: Every day | ORAL | 3 refills | Status: DC

## 2022-12-30 NOTE — Patient Instructions (Signed)
Ask Dr. Excell Seltzer if she will take over renewing citalopram 20 mg 1 daily.
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
## 2023-01-18 ENCOUNTER — Telehealth: Payer: Self-pay | Admitting: Orthopaedic Surgery

## 2023-01-18 NOTE — Telephone Encounter (Signed)
FYI.Marland KitchenMarland KitchenMarland Kitchenpatient called in stating she has a new Dr for her PCP care at [facility], Cecile Sheerer NP that we can consult if needing anything for her interest in Hip surgery

## 2023-01-19 NOTE — Telephone Encounter (Signed)
Veronica Baker, [place]

## 2023-01-20 NOTE — Telephone Encounter (Signed)
Ok, sounds good.
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
## 2023-02-14 ENCOUNTER — Other Ambulatory Visit: Payer: Self-pay | Admitting: Psychiatry

## 2023-02-14 DIAGNOSIS — F319 Bipolar disorder, unspecified: Secondary | ICD-10-CM

## 2023-04-27 ENCOUNTER — Other Ambulatory Visit: Payer: Self-pay

## 2023-05-17 NOTE — Pre-Procedure Instructions (Signed)
Surgical Instructions    Your procedure is scheduled on June 01, 2023.  Report to [facility] Main Entrance "A" at 07:30 A.M., then check in with the Admitting office.  Call this number if you have problems the morning of surgery:  [phone]   If you have any questions prior to your surgery date call [phone]: Open Monday-Friday 8am-4pm If you experience any cold or flu symptoms such as cough, fever, chills, shortness of breath, etc. between now and your scheduled surgery, please notify us at the above number     Remember:  Do not eat after midnight the night before your surgery  You may drink clear liquids until 7:00AM the morning of your surgery.   Clear liquids allowed are: Water, Non-Citrus Juices (without pulp), Carbonated Beverages, Clear Tea, Black Coffee ONLY (NO MILK, CREAM OR POWDERED CREAMER of any kind), and Gatorade  Patient Instructions  The night before surgery:  No food after midnight. ONLY clear liquids after midnight   The day of surgery : Drink ONE (1) 12 oz G2 given to you in your pre admission testing appointment by 7:00AM the morning of surgery. Drink in one sitting. Do not sip.  This drink was given to you during your hospital  pre-op appointment visit.  Nothing else to drink after completing the  12 oz bottle of G2.         If you have questions, please contact your surgeon's office.     Take these medicines the morning of surgery with A SIP OF WATER:  citalopram (CELEXA)  gabapentin (NEURONTIN)  loratadine (CLARITIN)  WIXELA INHUB 250-50 MCG/ACT AEPB   If needed: acetaminophen-codeine (TYLENOL #3) albuterol (VENTOLIN HFA) 108 (90 Base) MCG/ACT inhaler - Please bring all inhalers with you the day of surgery.  oxyCODONE (OXY IR/ROXICODONE)   Follow your surgeon's instructions on when to stop apixaban (ELIQUIS).  If no instructions were given by your surgeon then you will need to call the office to get those instructions.    As of today,  STOP taking any Aspirin (unless otherwise instructed by your surgeon) Aleve, Naproxen, Ibuprofen, Motrin, Advil, Goody's, BC's, all herbal medications, fish oil, and all vitamins.           [facility] is not responsible for any belongings or valuables.    Do NOT Smoke (Tobacco/Vaping)  24 hours prior to your procedure  If you use a CPAP at night, you may bring your mask for your overnight stay.   Contacts, glasses, hearing aids, dentures or partials may not be worn into surgery, please bring cases for these belongings   For patients admitted to the hospital, discharge time will be determined by your treatment team.   Patients discharged the day of surgery will not be allowed to drive home, and someone needs to stay with them for 24 hours.   SURGICAL WAITING ROOM VISITATION Patients having surgery or a procedure may have no more than 2 support people in the waiting area - these visitors may rotate.   Children under the age of 59 must have an adult with them who is not the patient. If the patient needs to stay at the hospital during part of their recovery, the visitor guidelines for inpatient rooms apply. Pre-op nurse will coordinate an appropriate time for 1 support person to accompany patient in pre-op.  This support person may not rotate.   Please refer to [email or web address] for the visitor guidelines for Inpatients (after your surgery is over and you are  in a regular room).    Special instructions:    Oral Hygiene is also important to reduce your risk of infection.  Remember - BRUSH YOUR TEETH THE MORNING OF SURGERY WITH YOUR REGULAR TOOTHPASTE     Pre-operative 5 CHG Bath Instructions   You can play a key role in reducing the risk of infection after surgery. Your skin needs to be as free of germs as possible. You can reduce the number of germs on your skin by washing with CHG (chlorhexidine gluconate) soap before surgery. CHG is  an antiseptic soap that kills germs and continues to kill germs even after washing.   DO NOT use if you have an allergy to chlorhexidine/CHG or antibacterial soaps. If your skin becomes reddened or irritated, stop using the CHG and notify one of our RNs at [phone].   Please shower with the CHG soap starting 4 days before surgery using the following schedule:     Please keep in mind the following:  DO NOT shave, including legs and underarms, starting the day of your first shower.   You may shave your face at any point before/day of surgery.  Place clean sheets on your bed the day you start using CHG soap. Use a clean washcloth (not used since being washed) for each shower. DO NOT sleep with pets once you start using the CHG.   CHG Shower Instructions:  If you choose to wash your hair and private area, wash first with your normal shampoo/soap.  After you use shampoo/soap, rinse your hair and body thoroughly to remove shampoo/soap residue.  Turn the water OFF and apply about 3 tablespoons (45 ml) of CHG soap to a CLEAN washcloth.  Apply CHG soap ONLY FROM YOUR NECK DOWN TO YOUR TOES (washing for 3-5 minutes)  DO NOT use CHG soap on face, private areas, open wounds, or sores.  Pay special attention to the area where your surgery is being performed.  If you are having back surgery, having someone wash your back for you may be helpful. Wait 2 minutes after CHG soap is applied, then you may rinse off the CHG soap.  Pat dry with a clean towel  Put on clean clothes/pajamas   If you choose to wear lotion, please use ONLY the CHG-compatible lotions on the back of this paper.     Additional instructions for the day of surgery: DO NOT APPLY any lotions, deodorants, cologne, or perfumes.   Put on clean/comfortable clothes.  Brush your teeth.  Ask your nurse before applying any prescription medications to the skin. Do not wear jewelry or makeup. Do not bring valuables to the hospital. Do not  wear nail polish, gel polish, artificial nails, or any other type of covering on natural nails (fingers and toes) If you have artificial nails or gel coating that need to be removed by a nail salon, please have this removed prior to surgery. Artificial nails or gel coating may interfere with anesthesia's ability to adequately monitor your vital signs.     CHG Compatible Lotions   Aveeno Moisturizing lotion  Cetaphil Moisturizing Cream  Cetaphil Moisturizing Lotion  Clairol Herbal Essence Moisturizing Lotion, Dry Skin  Clairol Herbal Essence Moisturizing Lotion, Extra Dry Skin  Clairol Herbal Essence Moisturizing Lotion, Normal Skin  Curel Age Defying Therapeutic Moisturizing Lotion with Alpha Hydroxy  Curel Extreme Care Body Lotion  Curel Soothing Hands Moisturizing Hand Lotion  Curel Therapeutic Moisturizing Cream, Fragrance-Free  Curel Therapeutic Moisturizing Lotion, Fragrance-Free  Curel Therapeutic Moisturizing  Lotion, Original Formula  Eucerin Daily Replenishing Lotion  Eucerin Dry Skin Therapy Plus Alpha Hydroxy Crme  Eucerin Dry Skin Therapy Plus Alpha Hydroxy Lotion  Eucerin Original Crme  Eucerin Original Lotion  Eucerin Plus Crme Eucerin Plus Lotion  Eucerin TriLipid Replenishing Lotion  Keri Anti-Bacterial Hand Lotion  Keri Deep Conditioning Original Lotion Dry Skin Formula Softly Scented  Keri Deep Conditioning Original Lotion, Fragrance Free Sensitive Skin Formula  Keri Lotion Fast Absorbing Fragrance Free Sensitive Skin Formula  Keri Lotion Fast Absorbing Softly Scented Dry Skin Formula  Keri Original Lotion  Keri Skin Renewal Lotion Keri Silky Smooth Lotion  Keri Silky Smooth Sensitive Skin Lotion  Nivea Body Creamy Conditioning Oil  Nivea Body Extra Enriched Lotion  Nivea Body Original Lotion  Nivea Body Sheer Moisturizing Lotion Nivea Crme  Nivea Skin Firming Lotion  NutraDerm 30 Skin Lotion  NutraDerm Skin Lotion  NutraDerm Therapeutic Skin Cream   NutraDerm Therapeutic Skin Lotion  ProShield Protective Hand Cream  Provon moisturizing lotion     If you received a COVID test during your pre-op visit, it is requested that you wear a mask when out in public, stay away from anyone that may not be feeling well, and notify your surgeon if you develop symptoms. If you have been in contact with anyone that has tested positive in the last 10 days, please notify your surgeon.    Please read over the following fact sheets that you were given.

## 2023-05-18 ENCOUNTER — Inpatient Hospital Stay (HOSPITAL_COMMUNITY)
Admission: RE | Admit: 2023-05-18 | Discharge: 2023-05-18 | Disposition: A | Discharge status: Home / Self Care | Payer: 59 | Source: Ambulatory Visit

## 2023-05-24 NOTE — Pre-Procedure Instructions (Signed)
Surgical Instructions   Your procedure is scheduled on Tuesday, October 1st. Report to [facility] Main Entrance "A" at 07:30 A.M., then check in with the Admitting office. Any questions or running late day of surgery: call [phone]  Questions prior to your surgery date: call [phone], Monday-Friday, 8am-4pm. If you experience any cold or flu symptoms such as cough, fever, chills, shortness of breath, etc. between now and your scheduled surgery, please notify us at the above number.     Remember:  Do not eat after midnight the night before your surgery  You may drink clear liquids until 07:00 AM the morning of your surgery.   Clear liquids allowed are: Water, Non-Citrus Juices (without pulp), Carbonated Beverages, Clear Tea, Black Coffee Only (NO MILK, CREAM OR POWDERED CREAMER of any kind), and Gatorade.  Patient Instructions  The night before surgery:  No food after midnight. ONLY clear liquids after midnight  The day of surgery (if you do NOT have diabetes):  Drink ONE (1) Pre-Surgery Clear Ensure by 07:00 AM the morning of surgery. Drink in one sitting. Do not sip.  This drink was given to you during your hospital  pre-op appointment visit.  Nothing else to drink after completing the  Pre-Surgery Clear Ensure.          If you have questions, please contact your surgeon's office.    Take these medicines the morning of surgery with A SIP OF WATER  citalopram (CELEXA)  gabapentin (NEURONTIN)  metoprolol tartrate (LOPRESSOR)  WIXELA INHUB    May take these medicines IF NEEDED: albuterol (VENTOLIN HFA)- bring inhaler with you on day of surgery fluticasone (FLONASE)    One week prior to surgery, STOP taking any Aspirin (unless otherwise instructed by your surgeon) Aleve, Naproxen, Ibuprofen, Motrin, Advil, Goody's, BC's, all herbal medications, fish oil, and non-prescription vitamins.                     Do NOT Smoke (Tobacco/Vaping) for 24 hours prior to your  procedure.  If you use a CPAP at night, you may bring your mask/headgear for your overnight stay.   You will be asked to remove any contacts, glasses, piercing's, hearing aid's, dentures/partials prior to surgery. Please bring cases for these items if needed.    Patients discharged the day of surgery will not be allowed to drive home, and someone needs to stay with them for 24 hours.  SURGICAL WAITING ROOM VISITATION Patients may have no more than 2 support people in the waiting area - these visitors may rotate.   Pre-op nurse will coordinate an appropriate time for 1 ADULT support person, who may not rotate, to accompany patient in pre-op.  Children under the age of 84 must have an adult with them who is not the patient and must remain in the main waiting area with an adult.  If the patient needs to stay at the hospital during part of their recovery, the visitor guidelines for inpatient rooms apply.  Please refer to the [facility] website for the visitor guidelines for any additional information.   If you received a COVID test during your pre-op visit  it is requested that you wear a mask when out in public, stay away from anyone that may not be feeling well and notify your surgeon if you develop symptoms. If you have been in contact with anyone that has tested positive in the last 10 days please notify you surgeon.      Pre-operative  5 CHG Bathing Instructions   You can play a key role in reducing the risk of infection after surgery. Your skin needs to be as free of germs as possible. You can reduce the number of germs on your skin by washing with CHG (chlorhexidine gluconate) soap before surgery. CHG is an antiseptic soap that kills germs and continues to kill germs even after washing.   DO NOT use if you have an allergy to chlorhexidine/CHG or antibacterial soaps. If your skin becomes reddened or irritated, stop using the CHG and notify one of our RNs at [phone].   Please  shower with the CHG soap starting 4 days before surgery using the following schedule:     Please keep in mind the following:  DO NOT shave, including legs and underarms, starting the day of your first shower.   You may shave your face at any point before/day of surgery.  Place clean sheets on your bed the day you start using CHG soap. Use a clean washcloth (not used since being washed) for each shower. DO NOT sleep with pets once you start using the CHG.   CHG Shower Instructions:  Wash your face and private area with normal soap. If you choose to wash your hair, wash first with your normal shampoo.  After you use shampoo/soap, rinse your hair and body thoroughly to remove shampoo/soap residue.  Turn the water OFF and apply about 3 tablespoons (45 ml) of CHG soap to a CLEAN washcloth.  Apply CHG soap ONLY FROM YOUR NECK DOWN TO YOUR TOES (washing for 3-5 minutes)  DO NOT use CHG soap on face, private areas, open wounds, or sores.  Pay special attention to the area where your surgery is being performed.  If you are having back surgery, having someone wash your back for you may be helpful. Wait 2 minutes after CHG soap is applied, then you may rinse off the CHG soap.  Pat dry with a clean towel  Put on clean clothes/pajamas   If you choose to wear lotion, please use ONLY the CHG-compatible lotions on the back of this paper.   Additional instructions for the day of surgery: DO NOT APPLY any lotions, deodorants, cologne, or perfumes.   Do not bring valuables to the hospital. [facility] is not responsible for any belongings/valuables. Do not wear nail polish, gel polish, artificial nails, or any other type of covering on natural nails (fingers and toes) Do not wear jewelry or makeup Put on clean/comfortable clothes.  Please brush your teeth.  Ask your nurse before applying any prescription medications to the skin.     CHG Compatible Lotions   Aveeno Moisturizing lotion  Cetaphil  Moisturizing Cream  Cetaphil Moisturizing Lotion  Clairol Herbal Essence Moisturizing Lotion, Dry Skin  Clairol Herbal Essence Moisturizing Lotion, Extra Dry Skin  Clairol Herbal Essence Moisturizing Lotion, Normal Skin  Curel Age Defying Therapeutic Moisturizing Lotion with Alpha Hydroxy  Curel Extreme Care Body Lotion  Curel Soothing Hands Moisturizing Hand Lotion  Curel Therapeutic Moisturizing Cream, Fragrance-Free  Curel Therapeutic Moisturizing Lotion, Fragrance-Free  Curel Therapeutic Moisturizing Lotion, Original Formula  Eucerin Daily Replenishing Lotion  Eucerin Dry Skin Therapy Plus Alpha Hydroxy Crme  Eucerin Dry Skin Therapy Plus Alpha Hydroxy Lotion  Eucerin Original Crme  Eucerin Original Lotion  Eucerin Plus Crme Eucerin Plus Lotion  Eucerin TriLipid Replenishing Lotion  Keri Anti-Bacterial Hand Lotion  Keri Deep Conditioning Original Lotion Dry Skin Formula Softly Scented  Keri Deep Conditioning Original Lotion, Fragrance  Free Sensitive Skin Formula  Keri Lotion Fast Absorbing Fragrance Free Sensitive Skin Formula  Keri Lotion Fast Absorbing Softly Scented Dry Skin Formula  Keri Original Lotion  Keri Skin Renewal Lotion Keri Silky Smooth Lotion  Keri Silky Smooth Sensitive Skin Lotion  Nivea Body Creamy Conditioning Oil  Nivea Body Extra Enriched Lotion  Nivea Body Original Lotion  Nivea Body Sheer Moisturizing Lotion Nivea Crme  Nivea Skin Firming Lotion  NutraDerm 30 Skin Lotion  NutraDerm Skin Lotion  NutraDerm Therapeutic Skin Cream  NutraDerm Therapeutic Skin Lotion  ProShield Protective Hand Cream  Provon moisturizing lotion  Please read over the following fact sheets that you were given.

## 2023-05-25 ENCOUNTER — Encounter (HOSPITAL_COMMUNITY)
Admission: RE | Admit: 2023-05-25 | Discharge: 2023-05-25 | Disposition: A | Discharge status: Home / Self Care | Payer: Medicare HMO | Source: Ambulatory Visit | Attending: Orthopaedic Surgery | Admitting: Orthopaedic Surgery

## 2023-05-25 ENCOUNTER — Telehealth: Payer: Self-pay | Admitting: Orthopaedic Surgery

## 2023-05-25 ENCOUNTER — Other Ambulatory Visit: Payer: Self-pay

## 2023-05-25 ENCOUNTER — Encounter (HOSPITAL_COMMUNITY): Payer: Self-pay

## 2023-05-25 ENCOUNTER — Other Ambulatory Visit: Payer: Self-pay | Admitting: Physician Assistant

## 2023-05-25 VITALS — BP 115/69 | HR 83 | Temp 97.9°F | Resp 18 | Ht 60.0 in | Wt 162.5 lb

## 2023-05-25 DIAGNOSIS — Z01812 Encounter for preprocedural laboratory examination: Secondary | ICD-10-CM | POA: Insufficient documentation

## 2023-05-25 DIAGNOSIS — Z01818 Encounter for other preprocedural examination: Secondary | ICD-10-CM

## 2023-05-25 DIAGNOSIS — R9431 Abnormal electrocardiogram [ECG] [EKG]: Secondary | ICD-10-CM | POA: Diagnosis not present

## 2023-05-25 HISTORY — DX: Atherosclerotic heart disease of native coronary artery without angina pectoris: I25.10

## 2023-05-25 LAB — CBC
HCT: 32.8 % — ABNORMAL LOW (ref 36.0–46.0)
Hemoglobin: 10.2 g/dL — ABNORMAL LOW (ref 12.0–15.0)
MCH: 29.1 pg (ref 26.0–34.0)
MCHC: 31.1 g/dL (ref 30.0–36.0)
MCV: 93.7 fL (ref 80.0–100.0)
Platelets: 121 10*3/uL — ABNORMAL LOW (ref 150–400)
RBC: 3.5 MIL/uL — ABNORMAL LOW (ref 3.87–5.11)
RDW: 12.2 % (ref 11.5–15.5)
WBC: 4.9 10*3/uL (ref 4.0–10.5)
nRBC: 0 % (ref 0.0–0.2)

## 2023-05-25 LAB — BASIC METABOLIC PANEL
Anion gap: 6 (ref 5–15)
BUN: 13 mg/dL (ref 8–23)
CO2: 26 mmol/L (ref 22–32)
Calcium: 9.1 mg/dL (ref 8.9–10.3)
Chloride: 108 mmol/L (ref 98–111)
Creatinine, Ser: 0.98 mg/dL (ref 0.44–1.00)
GFR, Estimated: >60 mL/min (ref >60)
Glucose, Bld: 104 mg/dL — ABNORMAL HIGH (ref 70–99)
Potassium: 3.4 mmol/L — ABNORMAL LOW (ref 3.5–5.1)
Sodium: 140 mmol/L (ref 135–145)

## 2023-05-25 LAB — TYPE AND SCREEN
ABO/RH(D): B POS
Antibody Screen: NEGATIVE

## 2023-05-25 LAB — SURGICAL PCR SCREEN
MRSA, PCR: NEGATIVE
Staphylococcus aureus: NEGATIVE

## 2023-05-25 NOTE — Telephone Encounter (Signed)
Patient wanted to know if she will be going to a rehab facility after her total hip surgery.  She states she lives alone.

## 2023-05-25 NOTE — Progress Notes (Signed)
PCP - Cecile Sheerer, NP Cardiologist - Dr. Wille Glaser  PPM/ICD - denies   Chest x-ray - 02/09/23 EKG - 03/25/23- CE- Records requested Stress Test - 04/15/23- CE ECHO - 10/15/22- CE Cardiac Cath - Pt has had a cardiac cath but she can't remember when it was  Sleep Study - OSA+ CPAP - pt wears CPAP sometimes, not all of the time  DM- denies  ASA/Blood Thinner Instructions: n/a   ERAS Protcol -  yes PRE-SURGERY Ensure given at PAT  COVID TEST- n/a   Anesthesia review: yes, cardiac hx  Patient denies shortness of breath, fever, cough and chest pain at PAT appointment   All instructions explained to the patient, with a verbal understanding of the material. Patient agrees to go over the instructions while at home for a better understanding.  The opportunity to ask questions was provided.

## 2023-05-25 NOTE — Telephone Encounter (Signed)
Most likely since she went last time.

## 2023-05-26 ENCOUNTER — Encounter (HOSPITAL_COMMUNITY): Payer: Self-pay | Admitting: Vascular Surgery

## 2023-05-26 ENCOUNTER — Encounter (HOSPITAL_COMMUNITY): Payer: Self-pay | Admitting: Physician Assistant

## 2023-05-26 ENCOUNTER — Other Ambulatory Visit: Payer: Self-pay | Admitting: Physician Assistant

## 2023-05-26 MED ORDER — ASPIRIN 81 MG PO TBEC: 81.0000 mg | DELAYED_RELEASE_TABLET | Freq: Two times a day (BID) | ORAL | 0 refills | Status: DC

## 2023-05-26 MED ORDER — OXYCODONE-ACETAMINOPHEN 5-325 MG PO TABS
1.0000 | ORAL_TABLET | Freq: Four times a day (QID) | ORAL | 0 refills | Status: DC | PRN
Start: 2023-05-26 — End: 2023-07-15

## 2023-05-26 MED ORDER — METHOCARBAMOL 750 MG PO TABS: 750.0000 mg | ORAL_TABLET | Freq: Two times a day (BID) | ORAL | 2 refills | Status: DC | PRN

## 2023-05-26 MED ORDER — DOCUSATE SODIUM 100 MG PO CAPS: 100.0000 mg | ORAL_CAPSULE | Freq: Every day | ORAL | 2 refills | Status: DC | PRN

## 2023-05-26 NOTE — Progress Notes (Signed)
Anesthesia Chart Review:  72 year old female follows with cardiology at [facility] for history of HTN, HLD, chronic lower extremity lymphedema, HFpEF, CVA, prior DVT (completed Eliquis therapy), OSA on CPAP.  Echo 12/2022 showed normal LV systolic function and no significant valvular disease.  Last seen by Dr. Houston Siren 03/25/2023 for preop evaluation.  Per note, "I will repeat Lexiscan nuclear stress test and if it is unremarkable patient could proceed with a hip surgery. Considering comorbidities as above patient has some risk of perioperative ischemic events but if Lexiscan nuclear stress test is unremarkable the risk would not be high."  Nuclear stress 04/15/2023 showed no evidence of ischemia, EF 80%.  Chronic mild thrombocytopenia  Follows with pulmonology at [facility] for history of moderate persistent asthma and OSA previously on BiPAP but now with limited use due to intolerance.  Maintained on Wixela Inhub and as needed albuterol.  Questionable history of sarcoidosis, CT chest 08/13/22 with no significant findings.   Preop labs reviewed, mild hypokalemia potassium 3.4, mild chronic anemia with Hgb 10.2, mild chronic thrombocytopenia platelets 121.   EKG 03/25/23 (care everywhere, tracing requested): Sinus Rhythm, HR 73. Early repolarization changes.   Nuclear stress 04/15/2023 (Care Everywhere): IMPRESSION:  - No evidence of inducible ischemia.  - No definite evidence of scar. Small sized mild intensity mid inferior and mid inferolateral septal fixed defect more likely secondary to diaphragmatic attenuation and motion artifact and doubtful for tiny scar. Normal contractility and thickening of these segments noted which makes scar less likely.  - Prognostically this is a low risk scan.  - Left ventricular ejection fraction of 80%. Normal LV size. Volume - 54 ml, end-systolic volume - 11 ml.  - Normal left ventricular systolic wall motion.   TTE 01/08/2023 (Care Everywhere): Left Ventricle:  Systolic function is normal. EF: 60-65%.    Left Ventricle: Wall motion is normal.    Left Ventricle: Unable to assess diastolic function without tissue  doppler velocities at medial and lateral annulus of mitral valve and  without LA volume index. LA size is normal. TR jet velocity < 34m/sec.    Tricuspid Valve: Unable to assess RVSP without IVC visualization, but  considering TR jet velocity less than 1 m/s, RVSP is likely within normal  limits.   No significant valvular disease was noted.   It was technically difficult study.  Patient was in wheelchair with  limited mobility.     Zannie Cove [facility] Short Stay Center/Anesthesiology Phone [phone] 05/26/2023 11:57 AM

## 2023-05-26 NOTE — Anesthesia Preprocedure Evaluation (Addendum)
Anesthesia Evaluation    Reviewed: Allergy & Precautions, Patient's Chart, lab work & pertinent test results  History of Anesthesia Complications Negative for: history of anesthetic complications  Airway        Dental   Pulmonary asthma , sleep apnea           Cardiovascular hypertension, Pt. on medications and Pt. on home beta blockers + CAD and + DVT       Neuro/Psych  Headaches PSYCHIATRIC DISORDERS  Depression Bipolar Disorder    Schizoaffective d/o  Neuromuscular disease CVA    GI/Hepatic negative GI ROS, Neg liver ROS,,,  Endo/Other   Obesity Pre-DM   Renal/GU negative Renal ROS     Musculoskeletal  (+) Arthritis ,  Fibromyalgia -  Abdominal   Peds  Hematology  (+) Blood dyscrasia, anemia  Plt 121k    Anesthesia Other Findings   Reproductive/Obstetrics                             Anesthesia Physical Anesthesia Plan  ASA: 3  Anesthesia Plan: Spinal   Post-op Pain Management: Tylenol PO (pre-op)*   Induction:   PONV Risk Score and Plan: 2 and Treatment may vary due to age or medical condition and Propofol infusion  Airway Management Planned: Natural Airway and Simple Face Mask  Additional Equipment: None  Intra-op Plan:   Post-operative Plan:   Informed Consent:   Plan Discussed with: CRNA and Anesthesiologist  Anesthesia Plan Comments: (See PAT note )        Anesthesia Quick Evaluation

## 2023-05-31 MED ORDER — TRANEXAMIC ACID 1000 MG/10ML IV SOLN
2000.0000 mg | INTRAVENOUS | Status: AC
  Filled 2023-05-31: qty 20

## 2023-06-01 DIAGNOSIS — M1611 Unilateral primary osteoarthritis, right hip: Secondary | ICD-10-CM

## 2023-06-01 NOTE — Progress Notes (Signed)
Spoke to patient and her niece this morning.  Her transportation never arrived therefore, we will need to cancel the surgery for today and reschedule for another time.

## 2023-06-16 ENCOUNTER — Encounter: Payer: 59 | Admitting: Orthopaedic Surgery

## 2023-06-22 ENCOUNTER — Ambulatory Visit (INDEPENDENT_AMBULATORY_CARE_PROVIDER_SITE_OTHER): Payer: Medicare HMO | Admitting: Physician Assistant

## 2023-06-22 ENCOUNTER — Encounter: Payer: 59 | Admitting: Physician Assistant

## 2023-06-22 ENCOUNTER — Encounter: Payer: Self-pay | Admitting: Physician Assistant

## 2023-06-22 DIAGNOSIS — M1611 Unilateral primary osteoarthritis, right hip: Secondary | ICD-10-CM

## 2023-06-22 NOTE — Progress Notes (Signed)
Patient comes in today to reschedule her right total hip replacement.  Unfortunately, she was canceled a few weeks ago due to transportation issues.  We will have Debbie speak with her today to reschedule her surgery.

## 2023-06-25 ENCOUNTER — Telehealth: Payer: Self-pay

## 2023-06-25 NOTE — Telephone Encounter (Signed)
Patient's son would like to know if he can get a letter for his job letting them know that he will be taking care of patient when she has her surgery.  CB# [phone].  Please advise.  Thank you.

## 2023-06-25 NOTE — Telephone Encounter (Signed)
Spoke with patient's son, [name]. Emailed note as requested to [email or web address]

## 2023-07-15 ENCOUNTER — Other Ambulatory Visit: Payer: Self-pay | Admitting: Physician Assistant

## 2023-07-15 MED ORDER — METHOCARBAMOL 750 MG PO TABS: 750.0000 mg | ORAL_TABLET | Freq: Two times a day (BID) | ORAL | 2 refills | Status: DC | PRN

## 2023-07-15 MED ORDER — DOCUSATE SODIUM 100 MG PO CAPS: 100.0000 mg | ORAL_CAPSULE | Freq: Every day | ORAL | 2 refills | Status: AC | PRN

## 2023-07-15 MED ORDER — OXYCODONE-ACETAMINOPHEN 5-325 MG PO TABS: 1.0000 | ORAL_TABLET | Freq: Four times a day (QID) | ORAL | 0 refills | Status: DC | PRN

## 2023-07-15 MED ORDER — ASPIRIN 81 MG PO TBEC: 81.0000 mg | DELAYED_RELEASE_TABLET | Freq: Two times a day (BID) | ORAL | 0 refills | Status: DC

## 2023-07-15 NOTE — Pre-Procedure Instructions (Addendum)
Surgical Instructions   Your procedure is scheduled on Friday, November 22nd. Report to [facility] Main Entrance "A" at 07:10 A.M., then check in with the Admitting office. Any questions or running late day of surgery: call [phone]  Questions prior to your surgery date: call [phone], Monday-Friday, 8am-4pm. If you experience any cold or flu symptoms such as cough, fever, chills, shortness of breath, etc. between now and your scheduled surgery, please notify us at the above number.     Remember:  Do not eat after midnight the night before your surgery  You may drink clear liquids until 06:40 AM the morning of your surgery.   Clear liquids allowed are: Water, Non-Citrus Juices (without pulp), Carbonated Beverages, Clear Tea (no milk, honey, etc.), Black Coffee Only (NO MILK, CREAM OR POWDERED CREAMER of any kind), and Gatorade.  Patient Instructions  The night before surgery:  No food after midnight. ONLY clear liquids after midnight  The day of surgery (if you do NOT have diabetes):  Drink ONE (1) Pre-Surgery Clear Ensure by 06:40 AM the morning of surgery. Drink in one sitting. Do not sip.  This drink was given to you during your hospital  pre-op appointment visit.  Nothing else to drink after completing the  Pre-Surgery Clear Ensure.          If you have questions, please contact your surgeon's office.    Take these medicines the morning of surgery with A SIP OF WATER  citalopram (CELEXA)  gabapentin (NEURONTIN)  metoprolol tartrate (LOPRESSOR)  WIXELA INHUB  loratadine (CLARITIN)   May take these medicines IF NEEDED: albuterol (VENTOLIN HFA)- bring inhaler with you on day of surgery fluticasone (FLONASE)     One week prior to surgery, STOP taking any Aspirin (unless otherwise instructed by your surgeon) Aleve, Naproxen, Ibuprofen, Motrin, Advil, Goody's, BC's, all herbal medications, fish oil, and non-prescription vitamins.                     Do NOT Smoke  (Tobacco/Vaping) for 24 hours prior to your procedure.  If you use a CPAP at night, you may bring your mask/headgear for your overnight stay.   You will be asked to remove any contacts, glasses, piercing's, hearing aid's, dentures/partials prior to surgery. Please bring cases for these items if needed.    Patients discharged the day of surgery will not be allowed to drive home, and someone needs to stay with them for 24 hours.  SURGICAL WAITING ROOM VISITATION Patients may have no more than 2 support people in the waiting area - these visitors may rotate.   Pre-op nurse will coordinate an appropriate time for 1 ADULT support person, who may not rotate, to accompany patient in pre-op.  Children under the age of 53 must have an adult with them who is not the patient and must remain in the main waiting area with an adult.  If the patient needs to stay at the hospital during part of their recovery, the visitor guidelines for inpatient rooms apply.  Please refer to the [facility] website for the visitor guidelines for any additional information.   If you received a COVID test during your pre-op visit  it is requested that you wear a mask when out in public, stay away from anyone that may not be feeling well and notify your surgeon if you develop symptoms. If you have been in contact with anyone that has tested positive in the last 10 days please notify you  Careers adviser.      Pre-operative 5 CHG Bathing Instructions   You can play a key role in reducing the risk of infection after surgery. Your skin needs to be as free of germs as possible. You can reduce the number of germs on your skin by washing with CHG (chlorhexidine gluconate) soap before surgery. CHG is an antiseptic soap that kills germs and continues to kill germs even after washing.   DO NOT use if you have an allergy to chlorhexidine/CHG or antibacterial soaps. If your skin becomes reddened or irritated, stop using the CHG and notify one  of our RNs at [phone].   Please shower with the CHG soap starting 4 days before surgery using the following schedule:     Please keep in mind the following:  DO NOT shave, including legs and underarms, starting the day of your first shower.   You may shave your face at any point before/day of surgery.  Place clean sheets on your bed the day you start using CHG soap. Use a clean washcloth (not used since being washed) for each shower. DO NOT sleep with pets once you start using the CHG.   CHG Shower Instructions:  Wash your face and private area with normal soap. If you choose to wash your hair, wash first with your normal shampoo.  After you use shampoo/soap, rinse your hair and body thoroughly to remove shampoo/soap residue.  Turn the water OFF and apply about 3 tablespoons (45 ml) of CHG soap to a CLEAN washcloth.  Apply CHG soap ONLY FROM YOUR NECK DOWN TO YOUR TOES (washing for 3-5 minutes)  DO NOT use CHG soap on face, private areas, open wounds, or sores.  Pay special attention to the area where your surgery is being performed.  If you are having back surgery, having someone wash your back for you may be helpful. Wait 2 minutes after CHG soap is applied, then you may rinse off the CHG soap.  Pat dry with a clean towel  Put on clean clothes/pajamas   If you choose to wear lotion, please use ONLY the CHG-compatible lotions on the back of this paper.   Additional instructions for the day of surgery: DO NOT APPLY any lotions, deodorants, cologne, or perfumes.   Do not bring valuables to the hospital. [facility] is not responsible for any belongings/valuables. Do not wear nail polish, gel polish, artificial nails, or any other type of covering on natural nails (fingers and toes) Do not wear jewelry or makeup Put on clean/comfortable clothes.  Please brush your teeth.  Ask your nurse before applying any prescription medications to the skin.     CHG Compatible Lotions    Aveeno Moisturizing lotion  Cetaphil Moisturizing Cream  Cetaphil Moisturizing Lotion  Clairol Herbal Essence Moisturizing Lotion, Dry Skin  Clairol Herbal Essence Moisturizing Lotion, Extra Dry Skin  Clairol Herbal Essence Moisturizing Lotion, Normal Skin  Curel Age Defying Therapeutic Moisturizing Lotion with Alpha Hydroxy  Curel Extreme Care Body Lotion  Curel Soothing Hands Moisturizing Hand Lotion  Curel Therapeutic Moisturizing Cream, Fragrance-Free  Curel Therapeutic Moisturizing Lotion, Fragrance-Free  Curel Therapeutic Moisturizing Lotion, Original Formula  Eucerin Daily Replenishing Lotion  Eucerin Dry Skin Therapy Plus Alpha Hydroxy Crme  Eucerin Dry Skin Therapy Plus Alpha Hydroxy Lotion  Eucerin Original Crme  Eucerin Original Lotion  Eucerin Plus Crme Eucerin Plus Lotion  Eucerin TriLipid Replenishing Lotion  Keri Anti-Bacterial Hand Lotion  Keri Deep Conditioning Original Lotion Dry Skin Formula Softly Scented  Keri Deep Conditioning Original Lotion, Fragrance Free Sensitive Skin Formula  Keri Lotion Fast Absorbing Fragrance Free Sensitive Skin Formula  Keri Lotion Fast Absorbing Softly Scented Dry Skin Formula  Keri Original Lotion  Keri Skin Renewal Lotion Keri Silky Smooth Lotion  Keri Silky Smooth Sensitive Skin Lotion  Nivea Body Creamy Conditioning Oil  Nivea Body Extra Enriched Lotion  Nivea Body Original Lotion  Nivea Body Sheer Moisturizing Lotion Nivea Crme  Nivea Skin Firming Lotion  NutraDerm 30 Skin Lotion  NutraDerm Skin Lotion  NutraDerm Therapeutic Skin Cream  NutraDerm Therapeutic Skin Lotion  ProShield Protective Hand Cream  Provon moisturizing lotion  Please read over the following fact sheets that you were given.

## 2023-07-16 ENCOUNTER — Encounter (HOSPITAL_COMMUNITY)
Admission: RE | Admit: 2023-07-16 | Discharge: 2023-07-16 | Disposition: A | Discharge status: Home / Self Care | Payer: Medicare HMO | Source: Ambulatory Visit | Attending: Orthopaedic Surgery | Admitting: Orthopaedic Surgery

## 2023-07-16 ENCOUNTER — Other Ambulatory Visit: Payer: Self-pay

## 2023-07-16 ENCOUNTER — Encounter (HOSPITAL_COMMUNITY): Payer: Self-pay

## 2023-07-16 VITALS — BP 148/99 | HR 88 | Temp 97.8°F | Ht 60.0 in | Wt 162.5 lb

## 2023-07-16 DIAGNOSIS — Z01812 Encounter for preprocedural laboratory examination: Secondary | ICD-10-CM | POA: Insufficient documentation

## 2023-07-16 DIAGNOSIS — M1611 Unilateral primary osteoarthritis, right hip: Secondary | ICD-10-CM | POA: Diagnosis not present

## 2023-07-16 DIAGNOSIS — Z01818 Encounter for other preprocedural examination: Secondary | ICD-10-CM

## 2023-07-16 HISTORY — DX: Personal history of urinary calculi: Z87.442

## 2023-07-16 LAB — CBC
HCT: 37.4 % (ref 36.0–46.0)
Hemoglobin: 11.6 g/dL — ABNORMAL LOW (ref 12.0–15.0)
MCH: 27.9 pg (ref 26.0–34.0)
MCHC: 31 g/dL (ref 30.0–36.0)
MCV: 89.9 fL (ref 80.0–100.0)
Platelets: 138 10*3/uL — ABNORMAL LOW (ref 150–400)
RBC: 4.16 MIL/uL (ref 3.87–5.11)
RDW: 12.4 % (ref 11.5–15.5)
WBC: 5.4 10*3/uL (ref 4.0–10.5)
nRBC: 0 % (ref 0.0–0.2)

## 2023-07-16 LAB — TYPE AND SCREEN
ABO/RH(D): B POS
Antibody Screen: NEGATIVE

## 2023-07-16 LAB — BASIC METABOLIC PANEL WITH GFR
Anion gap: 9 (ref 5–15)
BUN: 9 mg/dL (ref 8–23)
CO2: 25 mmol/L (ref 22–32)
Calcium: 9.3 mg/dL (ref 8.9–10.3)
Chloride: 106 mmol/L (ref 98–111)
Creatinine, Ser: 0.95 mg/dL (ref 0.44–1.00)
GFR, Estimated: >60 mL/min
Glucose, Bld: 90 mg/dL (ref 70–99)
Potassium: 3.8 mmol/L (ref 3.5–5.1)
Sodium: 140 mmol/L (ref 135–145)

## 2023-07-16 LAB — SURGICAL PCR SCREEN
MRSA, PCR: NEGATIVE
Staphylococcus aureus: NEGATIVE

## 2023-07-16 NOTE — Progress Notes (Signed)
PCP - Cecile Sheerer, NP Cardiologist - Dr. Wille Glaser  PPM/ICD - denies   Chest x-ray - 02/09/23 EKG - 06/20/23- tracing previously requested Stress Test - 04/15/23- CE ECHO - 03/06/21 Cardiac Cath - Pt states she has had one but is unsure when and where  Sleep Study - OSA+ CPAP - pt states she wears it sometimes but not all of the time  DM- denies    ASA/Blood Thinner Instructions: n/a   ERAS Protcol - yes PRE-SURGERY Ensure given at PAT  COVID TEST- n/a   Anesthesia review: yes, cardiac hx. Fayrene Fearing wrote previous note on pt (surgery was cancelled previously due to transportation issues)  Patient denies shortness of breath, fever, cough and chest pain at PAT appointment   All instructions explained to the patient, with a verbal understanding of the material. Patient agrees to go over the instructions while at home for a better understanding. The opportunity to ask questions was provided.

## 2023-07-19 ENCOUNTER — Other Ambulatory Visit: Payer: Self-pay | Admitting: Physician Assistant

## 2023-07-23 ENCOUNTER — Encounter (HOSPITAL_COMMUNITY): Payer: Self-pay | Admitting: Orthopaedic Surgery

## 2023-07-23 ENCOUNTER — Other Ambulatory Visit: Payer: Self-pay | Admitting: Physician Assistant

## 2023-07-23 ENCOUNTER — Other Ambulatory Visit: Payer: Self-pay

## 2023-07-23 ENCOUNTER — Encounter (HOSPITAL_COMMUNITY)
Admission: RE | Disposition: A | Discharge status: Skilled Nursing Facility | Payer: Self-pay | Source: Home / Self Care | Attending: Orthopaedic Surgery

## 2023-07-23 ENCOUNTER — Observation Stay (HOSPITAL_COMMUNITY)
Admission: RE | Admit: 2023-07-23 | Discharge: 2023-08-02 | Disposition: A | Discharge status: Skilled Nursing Facility | Payer: Medicare HMO | Attending: Orthopaedic Surgery | Admitting: Orthopaedic Surgery

## 2023-07-23 ENCOUNTER — Ambulatory Visit (HOSPITAL_COMMUNITY): Payer: Medicare HMO

## 2023-07-23 ENCOUNTER — Observation Stay (HOSPITAL_COMMUNITY): Payer: Medicare HMO

## 2023-07-23 ENCOUNTER — Encounter (HOSPITAL_COMMUNITY): Payer: Medicare HMO | Admitting: Certified Registered Nurse Anesthetist

## 2023-07-23 ENCOUNTER — Encounter (HOSPITAL_COMMUNITY): Payer: Self-pay | Admitting: Physician Assistant

## 2023-07-23 DIAGNOSIS — I10 Essential (primary) hypertension: Secondary | ICD-10-CM | POA: Insufficient documentation

## 2023-07-23 DIAGNOSIS — Z79899 Other long term (current) drug therapy: Secondary | ICD-10-CM | POA: Diagnosis not present

## 2023-07-23 DIAGNOSIS — M1611 Unilateral primary osteoarthritis, right hip: Secondary | ICD-10-CM | POA: Diagnosis present

## 2023-07-23 DIAGNOSIS — Z7982 Long term (current) use of aspirin: Secondary | ICD-10-CM | POA: Diagnosis not present

## 2023-07-23 DIAGNOSIS — I2510 Atherosclerotic heart disease of native coronary artery without angina pectoris: Secondary | ICD-10-CM | POA: Diagnosis not present

## 2023-07-23 DIAGNOSIS — Z96642 Presence of left artificial hip joint: Secondary | ICD-10-CM | POA: Diagnosis not present

## 2023-07-23 DIAGNOSIS — Z8673 Personal history of transient ischemic attack (TIA), and cerebral infarction without residual deficits: Secondary | ICD-10-CM | POA: Diagnosis not present

## 2023-07-23 DIAGNOSIS — Z96641 Presence of right artificial hip joint: Secondary | ICD-10-CM

## 2023-07-23 DIAGNOSIS — J45909 Unspecified asthma, uncomplicated: Secondary | ICD-10-CM | POA: Insufficient documentation

## 2023-07-23 HISTORY — PX: TOTAL HIP ARTHROPLASTY: SHX124

## 2023-07-23 SURGERY — ARTHROPLASTY, HIP, TOTAL, ANTERIOR APPROACH
Anesthesia: Spinal | Site: Hip | Laterality: Right

## 2023-07-23 MED ORDER — OXYCODONE HCL 5 MG PO TABS: 5.0000 mg | ORAL_TABLET | Freq: Once | ORAL | Status: DC | PRN

## 2023-07-23 MED ORDER — VANCOMYCIN HCL 1 G IV SOLR
INTRAVENOUS | Status: DC | PRN
  Administered 2023-07-23: 1000 mg via TOPICAL

## 2023-07-23 MED ORDER — ROCURONIUM BROMIDE 10 MG/ML (PF) SYRINGE
PREFILLED_SYRINGE | INTRAVENOUS | Status: DC | PRN
  Administered 2023-07-23: 60 mg via INTRAVENOUS

## 2023-07-23 MED ORDER — OXYCODONE HCL 5 MG/5ML PO SOLN: 5.0000 mg | Freq: Once | ORAL | Status: DC | PRN

## 2023-07-23 MED ORDER — BUPIVACAINE-MELOXICAM ER 400-12 MG/14ML IJ SOLN
INTRAMUSCULAR | Status: AC
  Filled 2023-07-23: qty 1

## 2023-07-23 MED ORDER — DEXAMETHASONE SODIUM PHOSPHATE 10 MG/ML IJ SOLN
INTRAMUSCULAR | Status: DC | PRN
  Administered 2023-07-23: 5 mg via INTRAVENOUS

## 2023-07-23 MED ORDER — MENTHOL 3 MG MT LOZG: 1.0000 | LOZENGE | OROMUCOSAL | Status: DC | PRN

## 2023-07-23 MED ORDER — ASPIRIN 81 MG PO TBEC: 81.0000 mg | DELAYED_RELEASE_TABLET | Freq: Two times a day (BID) | ORAL | 0 refills | Status: DC

## 2023-07-23 MED ORDER — SODIUM CHLORIDE 0.9 % IR SOLN
Status: DC | PRN
  Administered 2023-07-23: 1000 mL

## 2023-07-23 MED ORDER — CEFAZOLIN SODIUM-DEXTROSE 2-4 GM/100ML-% IV SOLN
2.0000 g | INTRAVENOUS | Status: AC
Start: 2023-07-23 — End: 2023-07-23
  Administered 2023-07-23: 2 g via INTRAVENOUS
  Filled 2023-07-23: qty 100

## 2023-07-23 MED ORDER — ACETAMINOPHEN 325 MG PO TABS
325.0000 mg | ORAL_TABLET | Freq: Four times a day (QID) | ORAL | Status: DC | PRN
  Administered 2023-07-26 – 2023-07-27 (×2): 650 mg via ORAL
  Filled 2023-07-23 (×2): qty 2

## 2023-07-23 MED ORDER — PHENYLEPHRINE 80 MCG/ML (10ML) SYRINGE FOR IV PUSH (FOR BLOOD PRESSURE SUPPORT)
PREFILLED_SYRINGE | INTRAVENOUS | Status: DC | PRN
  Administered 2023-07-23: 160 ug via INTRAVENOUS

## 2023-07-23 MED ORDER — PROPOFOL 10 MG/ML IV BOLUS
INTRAVENOUS | Status: DC | PRN
  Administered 2023-07-23: 60 mg via INTRAVENOUS

## 2023-07-23 MED ORDER — ASPIRIN 81 MG PO CHEW
81.0000 mg | CHEWABLE_TABLET | Freq: Two times a day (BID) | ORAL | Status: DC
  Administered 2023-07-23 – 2023-08-02 (×20): 81 mg via ORAL
  Filled 2023-07-23 (×21): qty 1

## 2023-07-23 MED ORDER — CHLORHEXIDINE GLUCONATE 0.12 % MT SOLN
15.0000 mL | Freq: Once | OROMUCOSAL | Status: AC
  Administered 2023-07-23: 15 mL via OROMUCOSAL
  Filled 2023-07-23: qty 15

## 2023-07-23 MED ORDER — PHENOL 1.4 % MT LIQD: 1.0000 | OROMUCOSAL | Status: DC | PRN

## 2023-07-23 MED ORDER — STERILE WATER FOR IRRIGATION IR SOLN
Status: DC | PRN
  Administered 2023-07-23: 1000 mL

## 2023-07-23 MED ORDER — MIDAZOLAM HCL 2 MG/2ML IJ SOLN
INTRAMUSCULAR | Status: AC
Start: 2023-07-23 — End: ?
  Filled 2023-07-23: qty 2

## 2023-07-23 MED ORDER — OXYCODONE-ACETAMINOPHEN 5-325 MG PO TABS: 1.0000 | ORAL_TABLET | Freq: Four times a day (QID) | ORAL | 0 refills | Status: AC | PRN

## 2023-07-23 MED ORDER — ONDANSETRON HCL 4 MG/2ML IJ SOLN
INTRAMUSCULAR | Status: DC | PRN
  Administered 2023-07-23: 4 mg via INTRAVENOUS

## 2023-07-23 MED ORDER — MIDAZOLAM HCL 2 MG/2ML IJ SOLN
INTRAMUSCULAR | Status: DC | PRN
  Administered 2023-07-23 (×2): 1 mg via INTRAVENOUS

## 2023-07-23 MED ORDER — LIDOCAINE 2% (20 MG/ML) 5 ML SYRINGE
INTRAMUSCULAR | Status: AC
  Filled 2023-07-23: qty 5

## 2023-07-23 MED ORDER — BUPIVACAINE-MELOXICAM ER 400-12 MG/14ML IJ SOLN
INTRAMUSCULAR | Status: DC | PRN
  Administered 2023-07-23: 400 mg

## 2023-07-23 MED ORDER — POLYETHYLENE GLYCOL 3350 17 G PO PACK
17.0000 g | PACK | Freq: Every day | ORAL | Status: DC
  Administered 2023-07-23 – 2023-08-02 (×10): 17 g via ORAL
  Filled 2023-07-23 (×11): qty 1

## 2023-07-23 MED ORDER — CEFAZOLIN SODIUM 1 G IJ SOLR
INTRAMUSCULAR | Status: AC
  Filled 2023-07-23: qty 10

## 2023-07-23 MED ORDER — FENTANYL CITRATE (PF) 250 MCG/5ML IJ SOLN
INTRAMUSCULAR | Status: AC
  Filled 2023-07-23: qty 5

## 2023-07-23 MED ORDER — OXYCODONE HCL 5 MG PO TABS
10.0000 mg | ORAL_TABLET | ORAL | Status: DC | PRN
  Administered 2023-07-25 – 2023-07-27 (×3): 10 mg via ORAL
  Administered 2023-07-30 – 2023-08-02 (×3): 15 mg via ORAL
  Filled 2023-07-23 (×3): qty 3
  Filled 2023-07-23: qty 2

## 2023-07-23 MED ORDER — METHOCARBAMOL 750 MG PO TABS: 750.0000 mg | ORAL_TABLET | Freq: Two times a day (BID) | ORAL | 2 refills | Status: AC | PRN

## 2023-07-23 MED ORDER — CEFAZOLIN SODIUM-DEXTROSE 2-4 GM/100ML-% IV SOLN
2.0000 g | Freq: Four times a day (QID) | INTRAVENOUS | Status: AC
  Administered 2023-07-23 – 2023-07-24 (×2): 2 g via INTRAVENOUS
  Filled 2023-07-23 (×2): qty 100

## 2023-07-23 MED ORDER — DOCUSATE SODIUM 100 MG PO CAPS
100.0000 mg | ORAL_CAPSULE | Freq: Two times a day (BID) | ORAL | Status: DC
  Administered 2023-07-23 – 2023-08-02 (×21): 100 mg via ORAL
  Filled 2023-07-23 (×21): qty 1

## 2023-07-23 MED ORDER — ALBUMIN HUMAN 5 % IV SOLN: INTRAVENOUS | Status: DC | PRN

## 2023-07-23 MED ORDER — METOCLOPRAMIDE HCL 5 MG PO TABS: 5.0000 mg | ORAL_TABLET | Freq: Three times a day (TID) | ORAL | Status: DC | PRN

## 2023-07-23 MED ORDER — SODIUM CHLORIDE 0.9 % IV SOLN
2000.0000 mg | INTRAVENOUS | Status: AC
  Filled 2023-07-23: qty 20

## 2023-07-23 MED ORDER — SUGAMMADEX SODIUM 200 MG/2ML IV SOLN
INTRAVENOUS | Status: DC | PRN
  Administered 2023-07-23: 200 mg via INTRAVENOUS

## 2023-07-23 MED ORDER — TRANEXAMIC ACID 1000 MG/10ML IV SOLN
INTRAVENOUS | Status: DC | PRN
  Administered 2023-07-23: 2000 mg via TOPICAL

## 2023-07-23 MED ORDER — DEXAMETHASONE SODIUM PHOSPHATE 10 MG/ML IJ SOLN
INTRAMUSCULAR | Status: AC
  Filled 2023-07-23: qty 1

## 2023-07-23 MED ORDER — SUCCINYLCHOLINE CHLORIDE 200 MG/10ML IV SOSY
PREFILLED_SYRINGE | INTRAVENOUS | Status: AC
  Filled 2023-07-23: qty 10

## 2023-07-23 MED ORDER — FENTANYL CITRATE (PF) 250 MCG/5ML IJ SOLN
INTRAMUSCULAR | Status: DC | PRN
  Administered 2023-07-23 (×2): 25 ug via INTRAVENOUS

## 2023-07-23 MED ORDER — LACTATED RINGERS IV SOLN: INTRAVENOUS | Status: DC

## 2023-07-23 MED ORDER — METHOCARBAMOL 1000 MG/10ML IJ SOLN: 500.0000 mg | Freq: Four times a day (QID) | INTRAMUSCULAR | Status: DC | PRN

## 2023-07-23 MED ORDER — POVIDONE-IODINE 10 % EX SWAB
2.0000 | Freq: Once | CUTANEOUS | Status: AC
  Administered 2023-07-23: 2 via TOPICAL

## 2023-07-23 MED ORDER — METHOCARBAMOL 500 MG PO TABS
500.0000 mg | ORAL_TABLET | Freq: Four times a day (QID) | ORAL | Status: DC | PRN
  Administered 2023-07-23 – 2023-08-01 (×7): 500 mg via ORAL
  Filled 2023-07-23 (×7): qty 1

## 2023-07-23 MED ORDER — TRANEXAMIC ACID-NACL 1000-0.7 MG/100ML-% IV SOLN
1000.0000 mg | INTRAVENOUS | Status: AC
  Administered 2023-07-23: 1000 mg via INTRAVENOUS
  Filled 2023-07-23: qty 100

## 2023-07-23 MED ORDER — SORBITOL 70 % SOLN
30.0000 mL | Freq: Every day | Status: DC | PRN
  Administered 2023-07-27 – 2023-07-29 (×2): 30 mL via ORAL
  Filled 2023-07-23 (×4): qty 30

## 2023-07-23 MED ORDER — PHENYLEPHRINE HCL-NACL 20-0.9 MG/250ML-% IV SOLN
INTRAVENOUS | Status: DC | PRN
  Administered 2023-07-23: 50 ug/min via INTRAVENOUS

## 2023-07-23 MED ORDER — MAGNESIUM CITRATE PO SOLN
1.0000 | Freq: Once | ORAL | Status: AC | PRN
  Administered 2023-07-28: 1 via ORAL
  Filled 2023-07-23: qty 296

## 2023-07-23 MED ORDER — PHENYLEPHRINE 80 MCG/ML (10ML) SYRINGE FOR IV PUSH (FOR BLOOD PRESSURE SUPPORT)
PREFILLED_SYRINGE | INTRAVENOUS | Status: AC
  Filled 2023-07-23: qty 10

## 2023-07-23 MED ORDER — PRONTOSAN WOUND IRRIGATION OPTIME
TOPICAL | Status: DC | PRN
  Administered 2023-07-23: 350 mL

## 2023-07-23 MED ORDER — ONDANSETRON HCL 4 MG/2ML IJ SOLN: 4.0000 mg | Freq: Four times a day (QID) | INTRAMUSCULAR | Status: DC | PRN

## 2023-07-23 MED ORDER — FENTANYL CITRATE (PF) 100 MCG/2ML IJ SOLN: 25.0000 ug | INTRAMUSCULAR | Status: DC | PRN

## 2023-07-23 MED ORDER — ALUM & MAG HYDROXIDE-SIMETH 200-200-20 MG/5ML PO SUSP: 30.0000 mL | ORAL | Status: DC | PRN

## 2023-07-23 MED ORDER — ACETAMINOPHEN 10 MG/ML IV SOLN: 1000.0000 mg | Freq: Once | INTRAVENOUS | Status: DC | PRN

## 2023-07-23 MED ORDER — ONDANSETRON HCL 4 MG/2ML IJ SOLN
INTRAMUSCULAR | Status: AC
  Filled 2023-07-23: qty 2

## 2023-07-23 MED ORDER — TRANEXAMIC ACID-NACL 1000-0.7 MG/100ML-% IV SOLN
1000.0000 mg | Freq: Once | INTRAVENOUS | Status: AC
  Administered 2023-07-23: 1000 mg via INTRAVENOUS
  Filled 2023-07-23: qty 100

## 2023-07-23 MED ORDER — ASPIRIN 81 MG PO TBEC: 81.0000 mg | DELAYED_RELEASE_TABLET | Freq: Two times a day (BID) | ORAL | 0 refills | Status: AC

## 2023-07-23 MED ORDER — METOCLOPRAMIDE HCL 5 MG/ML IJ SOLN: 5.0000 mg | Freq: Three times a day (TID) | INTRAMUSCULAR | Status: DC | PRN

## 2023-07-23 MED ORDER — ROCURONIUM BROMIDE 10 MG/ML (PF) SYRINGE
PREFILLED_SYRINGE | INTRAVENOUS | Status: AC
  Filled 2023-07-23: qty 10

## 2023-07-23 MED ORDER — ORAL CARE MOUTH RINSE: 15.0000 mL | Freq: Once | OROMUCOSAL | Status: AC

## 2023-07-23 MED ORDER — DEXAMETHASONE SODIUM PHOSPHATE 10 MG/ML IJ SOLN
10.0000 mg | Freq: Once | INTRAMUSCULAR | Status: AC
  Administered 2023-07-24: 10 mg via INTRAVENOUS
  Filled 2023-07-23: qty 1

## 2023-07-23 MED ORDER — VANCOMYCIN HCL 1000 MG IV SOLR
INTRAVENOUS | Status: AC
  Filled 2023-07-23: qty 20

## 2023-07-23 MED ORDER — HYDROMORPHONE HCL 1 MG/ML IJ SOLN
0.5000 mg | INTRAMUSCULAR | Status: DC | PRN
Start: 2023-07-23 — End: 2023-08-02
  Administered 2023-07-30: 1 mg via INTRAVENOUS
  Filled 2023-07-23: qty 1

## 2023-07-23 MED ORDER — FERROUS SULFATE 325 (65 FE) MG PO TABS
325.0000 mg | ORAL_TABLET | Freq: Three times a day (TID) | ORAL | Status: DC
  Administered 2023-07-23 – 2023-08-02 (×28): 325 mg via ORAL
  Filled 2023-07-23 (×28): qty 1

## 2023-07-23 MED ORDER — ACETAMINOPHEN 500 MG PO TABS
1000.0000 mg | ORAL_TABLET | Freq: Four times a day (QID) | ORAL | Status: AC
  Administered 2023-07-23 – 2023-07-24 (×4): 1000 mg via ORAL
  Filled 2023-07-23 (×4): qty 2

## 2023-07-23 MED ORDER — PANTOPRAZOLE SODIUM 40 MG PO TBEC
40.0000 mg | DELAYED_RELEASE_TABLET | Freq: Every day | ORAL | Status: DC
  Administered 2023-07-23 – 2023-08-02 (×11): 40 mg via ORAL
  Filled 2023-07-23 (×11): qty 1

## 2023-07-23 MED ORDER — OXYCODONE-ACETAMINOPHEN 5-325 MG PO TABS: 1.0000 | ORAL_TABLET | Freq: Four times a day (QID) | ORAL | 0 refills | Status: DC | PRN

## 2023-07-23 MED ORDER — ONDANSETRON HCL 4 MG PO TABS: 4.0000 mg | ORAL_TABLET | Freq: Four times a day (QID) | ORAL | Status: DC | PRN

## 2023-07-23 MED ORDER — 0.9 % SODIUM CHLORIDE (POUR BTL) OPTIME
TOPICAL | Status: DC | PRN
  Administered 2023-07-23: 1000 mL

## 2023-07-23 MED ORDER — DIPHENHYDRAMINE HCL 12.5 MG/5ML PO ELIX: 25.0000 mg | ORAL_SOLUTION | ORAL | Status: DC | PRN

## 2023-07-23 MED ORDER — OXYCODONE HCL 5 MG PO TABS
5.0000 mg | ORAL_TABLET | ORAL | Status: DC | PRN
  Administered 2023-07-23: 5 mg via ORAL
  Administered 2023-07-23 – 2023-07-24 (×3): 10 mg via ORAL
  Administered 2023-07-25 – 2023-07-26 (×2): 5 mg via ORAL
  Administered 2023-07-28 – 2023-08-01 (×4): 10 mg via ORAL
  Filled 2023-07-23 (×2): qty 2
  Filled 2023-07-23: qty 1
  Filled 2023-07-23 (×2): qty 2
  Filled 2023-07-23: qty 1
  Filled 2023-07-23: qty 2
  Filled 2023-07-23: qty 1
  Filled 2023-07-23 (×4): qty 2

## 2023-07-23 SURGICAL SUPPLY — 59 items
BAG COUNTER SPONGE SURGICOUNT (BAG) ×1 IMPLANT
BAG DECANTER FOR FLEXI CONT (MISCELLANEOUS) ×1 IMPLANT
BLADE SAG 18X100X1.27 (BLADE) ×2 IMPLANT
CANISTER WOUNDNEG PRESSURE 500 (CANNISTER) IMPLANT
COVER PERINEAL POST (MISCELLANEOUS) ×2 IMPLANT
COVER SURGICAL LIGHT HANDLE (MISCELLANEOUS) ×1 IMPLANT
DRAPE C-ARM 42X72 X-RAY (DRAPES) ×1 IMPLANT
DRAPE POUCH INSTRU U-SHP 10X18 (DRAPES) ×1 IMPLANT
DRAPE STERI IOBAN 125X83 (DRAPES) ×1 IMPLANT
DRAPE U-SHAPE 47X51 STRL (DRAPES) ×4 IMPLANT
DRESSING PEEL AND PLAC PRVNA20 (GAUZE/BANDAGES/DRESSINGS) IMPLANT
DRSG AQUACEL AG ADV 3.5X10 (GAUZE/BANDAGES/DRESSINGS) ×2 IMPLANT
DRSG PEEL AND PLACE PREVENA 20 (GAUZE/BANDAGES/DRESSINGS) ×1
DURAPREP 26ML APPLICATOR (WOUND CARE) ×2 IMPLANT
ELECT BLADE 4.0 EZ CLEAN MEGAD (MISCELLANEOUS) ×1
ELECT REM PT RETURN 9FT ADLT (ELECTROSURGICAL) ×1
ELECTRODE BLDE 4.0 EZ CLN MEGD (MISCELLANEOUS) ×1 IMPLANT
ELECTRODE REM PT RTRN 9FT ADLT (ELECTROSURGICAL) ×1 IMPLANT
GLOVE BIOGEL PI IND STRL 7.0 (GLOVE) ×4 IMPLANT
GLOVE BIOGEL PI IND STRL 7.5 (GLOVE) ×5 IMPLANT
GLOVE ECLIPSE 7.0 STRL STRAW (GLOVE) ×4 IMPLANT
GLOVE SKINSENSE STRL SZ7.5 (GLOVE) ×1 IMPLANT
GLOVE SURG SYN 7.5 E (GLOVE) ×2 IMPLANT
GLOVE SURG SYN 7.5 PF PI (GLOVE) ×2 IMPLANT
GLOVE SURG UNDER POLY LF SZ7 (GLOVE) ×3 IMPLANT
GLOVE SURG UNDER POLY LF SZ7.5 (GLOVE) ×4 IMPLANT
GOWN STRL REUS W/ TWL LRG LVL3 (GOWN DISPOSABLE) IMPLANT
GOWN STRL REUS W/ TWL XL LVL3 (GOWN DISPOSABLE) ×2 IMPLANT
GOWN STRL SURGICAL XL XLNG (GOWN DISPOSABLE) ×1 IMPLANT
GOWN TOGA ZIPPER T7+ PEEL AWAY (MISCELLANEOUS) ×4 IMPLANT
HEAD M SROM 36MM PLUS 1.5 (Hips) IMPLANT
HOOD PEEL AWAY T7 (MISCELLANEOUS) ×2 IMPLANT
IV NS IRRIG 3000ML ARTHROMATIC (IV SOLUTION) ×2 IMPLANT
KIT BASIN OR (CUSTOM PROCEDURE TRAY) ×1 IMPLANT
LINER NEUTRAL 52X36MM PLUS 4 (Liner) IMPLANT
MARKER SKIN DUAL TIP RULER LAB (MISCELLANEOUS) ×1 IMPLANT
NDL SPNL 18GX3.5 QUINCKE PK (NEEDLE) ×2 IMPLANT
NEEDLE SPNL 18GX3.5 QUINCKE PK (NEEDLE) ×1 IMPLANT
PACK TOTAL JOINT (CUSTOM PROCEDURE TRAY) ×2 IMPLANT
PACK UNIVERSAL I (CUSTOM PROCEDURE TRAY) ×2 IMPLANT
PIN SECTOR W/GRIP ACE CUP 52MM (Hips) IMPLANT
PREVENA INCISION MGT 90 150 (MISCELLANEOUS) IMPLANT
SET HNDPC FAN SPRY TIP SCT (DISPOSABLE) ×1 IMPLANT
SOLUTION PRONTOSAN WOUND 350ML (IRRIGATION / IRRIGATOR) ×1 IMPLANT
SROM M HEAD 36MM PLUS 1.5 (Hips) ×1 IMPLANT
STAPLER VISISTAT 35W (STAPLE) IMPLANT
STEM FEMORAL SZ 6MM STD ACTIS (Stem) IMPLANT
SUT ETHIBOND 2 V 37 (SUTURE) ×1 IMPLANT
SUT VIC AB 0 CT1 27XBRD ANBCTR (SUTURE) ×2 IMPLANT
SUT VIC AB 1 CTX36XBRD ANBCTR (SUTURE) ×2 IMPLANT
SUT VIC AB 2-0 CT1 TAPERPNT 27 (SUTURE) ×4 IMPLANT
SUT VIC AB CT1 27XBRD ANBCTRL (SUTURE) ×1
SYR 50ML LL SCALE MARK (SYRINGE) ×2 IMPLANT
TOWEL GREEN STERILE (TOWEL DISPOSABLE) ×1 IMPLANT
TRAY CATH INTERMITTENT SS 16FR (CATHETERS) IMPLANT
TRAY FOLEY W/BAG SLVR 16FR ST (SET/KITS/TRAYS/PACK) IMPLANT
TUBE SUCT ARGYLE STRL (TUBING) ×2 IMPLANT
WATER STERILE IRR 1000ML POUR (IV SOLUTION) IMPLANT
YANKAUER SUCT BULB TIP NO VENT (SUCTIONS) ×1 IMPLANT

## 2023-07-23 NOTE — Anesthesia Procedure Notes (Deleted)
Date/Time: 07/23/2023 9:59 AM  Performed by: Shary Decamp, CRNAPre-anesthesia Checklist: Patient identified, Emergency Drugs available, Suction available, Timeout performed and Patient being monitored Patient Re-evaluated:Patient Re-evaluated prior to induction Oxygen Delivery Method: Simple face mask
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
## 2023-07-23 NOTE — Transfer of Care (Signed)
Immediate Anesthesia Transfer of Care Note  Patient: ARLANDA BONSALL  Procedure(s) Performed: TOTAL HIP ARTHROPLASTY ANTERIOR APPROACH (Right: Hip)  Patient Location: PACU  Anesthesia Type:General  Level of Consciousness: drowsy  Airway & Oxygen Therapy: Patient Spontanous Breathing and Patient connected to face mask oxygen  Post-op Assessment: Report given to RN and Post -op Vital signs reviewed and stable  Post vital signs: Reviewed and stable  Last Vitals:  Vitals Value Taken Time  BP    Temp    Pulse 70 07/23/23 1220  Resp 17 07/23/23 1225  SpO2 100 % 07/23/23 1220  Vitals shown include unfiled device data.  Last Pain:  Vitals:   07/23/23 0805  TempSrc: Oral         Complications: No notable events documented.

## 2023-07-23 NOTE — Discharge Instructions (Addendum)
INSTRUCTIONS AFTER JOINT REPLACEMENT   Remove items at home which could result in a fall. This includes throw rugs or furniture in walking pathways ICE to the affected joint every three hours while awake for 30 minutes at a time, for at least the first 3-5 days, and then as needed for pain and swelling.  Continue to use ice for pain and swelling. You may notice swelling that will progress down to the foot and ankle.  This is normal after surgery.  Elevate your leg when you are not up walking on it.   Continue to use the breathing machine you got in the hospital (incentive spirometer) which will help keep your temperature down.  It is common for your temperature to cycle up and down following surgery, especially at night when you are not up moving around and exerting yourself.  The breathing machine keeps your lungs expanded and your temperature down.   DIET:  As you were doing prior to hospitalization, we recommend a well-balanced diet.  DRESSING / WOUND CARE / SHOWERING  Do not remove wound vac.  Do not get wound vac wet. Wound vac can be charged. Please charge it every day so that it does not run out of battery and stops working. If there are any issues with the wound vac, please call the office.   ACTIVITY  Increase activity slowly as tolerated, but follow the weight bearing instructions below.   No driving for 6 weeks or until further direction given by your physician.  You cannot drive while taking narcotics.  No lifting or carrying greater than 10 lbs. until further directed by your surgeon. Avoid periods of inactivity such as sitting longer than an hour when not asleep. This helps prevent blood clots.  You may return to work once you are authorized by your doctor.     WEIGHT BEARING   Weight bearing as tolerated with assist device (walker, cane, etc) as directed, use it as long as suggested by your surgeon or therapist, typically at least 4-6 weeks.   EXERCISES  Results after  joint replacement surgery are often greatly improved when you follow the exercise, range of motion and muscle strengthening exercises prescribed by your doctor. Safety measures are also important to protect the joint from further injury. Any time any of these exercises cause you to have increased pain or swelling, decrease what you are doing until you are comfortable again and then slowly increase them. If you have problems or questions, call your caregiver or physical therapist for advice.   Rehabilitation is important following a joint replacement. After just a few days of immobilization, the muscles of the leg can become weakened and shrink (atrophy).  These exercises are designed to build up the tone and strength of the thigh and leg muscles and to improve motion. Often times heat used for twenty to thirty minutes before working out will loosen up your tissues and help with improving the range of motion but do not use heat for the first two weeks following surgery (sometimes heat can increase post-operative swelling).   These exercises can be done on a training (exercise) mat, on the floor, on a table or on a bed. Use whatever works the best and is most comfortable for you.    Use music or television while you are exercising so that the exercises are a pleasant break in your day. This will make your life better with the exercises acting as a break in your routine that you can  look forward to.   Perform all exercises about fifteen times, three times per day or as directed.  You should exercise both the operative leg and the other leg as well.  Exercises include:   Quad Sets - Tighten up the muscle on the front of the thigh (Quad) and hold for 5-10 seconds.   Straight Leg Raises - With your knee straight (if you were given a brace, keep it on), lift the leg to 60 degrees, hold for 3 seconds, and slowly lower the leg.  Perform this exercise against resistance later as your leg gets stronger.  Leg Slides:  Lying on your back, slowly slide your foot toward your buttocks, bending your knee up off the floor (only go as far as is comfortable). Then slowly slide your foot back down until your leg is flat on the floor again.  Angel Wings: Lying on your back spread your legs to the side as far apart as you can without causing discomfort.  Hamstring Strength:  Lying on your back, push your heel against the floor with your leg straight by tightening up the muscles of your buttocks.  Repeat, but this time bend your knee to a comfortable angle, and push your heel against the floor.  You may put a pillow under the heel to make it more comfortable if necessary.   A rehabilitation program following joint replacement surgery can speed recovery and prevent re-injury in the future due to weakened muscles. Contact your doctor or a physical therapist for more information on knee rehabilitation.    CONSTIPATION  Constipation is defined medically as fewer than three stools per week and severe constipation as less than one stool per week.  Even if you have a regular bowel pattern at home, your normal regimen is likely to be disrupted due to multiple reasons following surgery.  Combination of anesthesia, postoperative narcotics, change in appetite and fluid intake all can affect your bowels.   YOU MUST use at least one of the following options; they are listed in order of increasing strength to get the job done.  They are all available over the counter, and you may need to use some, POSSIBLY even all of these options:    Drink plenty of fluids (prune juice may be helpful) and high fiber foods Colace 100 mg by mouth twice a day  Senokot for constipation as directed and as needed Dulcolax (bisacodyl), take with full glass of water  Miralax (polyethylene glycol) once or twice a day as needed.  If you have tried all these things and are unable to have a bowel movement in the first 3-4 days after surgery call either your  surgeon or your primary doctor.    If you experience loose stools or diarrhea, hold the medications until you stool forms back up.  If your symptoms do not get better within 1 week or if they get worse, check with your doctor.  If you experience "the worst abdominal pain ever" or develop nausea or vomiting, please contact the office immediately for further recommendations for treatment.   ITCHING:  If you experience itching with your medications, try taking only a single pain pill, or even half a pain pill at a time.  You can also use Benadryl over the counter for itching or also to help with sleep.   TED HOSE STOCKINGS:  Use stockings on both legs until for at least 2 weeks or as directed by physician office. They may be removed at night for  sleeping.  MEDICATIONS:  See your medication summary on the "After Visit Summary" that nursing will review with you.  You may have some home medications which will be placed on hold until you complete the course of blood thinner medication.  It is important for you to complete the blood thinner medication as prescribed.  PRECAUTIONS:  If you experience chest pain or shortness of breath - call 911 immediately for transfer to the hospital emergency department.   If you develop a fever greater that 101 F, purulent drainage from wound, increased redness or drainage from wound, foul odor from the wound/dressing, or calf pain - CONTACT YOUR SURGEON.                                                   FOLLOW-UP APPOINTMENTS:  If you do not already have a post-op appointment, please call the office for an appointment to be seen by your surgeon.  Guidelines for how soon to be seen are listed in your "After Visit Summary", but are typically between 1-4 weeks after surgery.  OTHER INSTRUCTIONS:   Knee Replacement:  Do not place pillow under knee, focus on keeping the knee straight while resting. CPM instructions: 0-90 degrees, 2 hours in the morning, 2 hours in the  afternoon, and 2 hours in the evening. Place foam block, curve side up under heel at all times except when in CPM or when walking.  DO NOT modify, tear, cut, or change the foam block in any way.  POST-OPERATIVE OPIOID TAPER INSTRUCTIONS: It is important to wean off of your opioid medication as soon as possible. If you do not need pain medication after your surgery it is ok to stop day one. Opioids include: Codeine, Hydrocodone(Norco, Vicodin), Oxycodone(Percocet, oxycontin) and hydromorphone amongst others.  Long term and even short term use of opiods can cause: Increased pain response Dependence Constipation Depression Respiratory depression And more.  Withdrawal symptoms can include Flu like symptoms Nausea, vomiting And more Techniques to manage these symptoms Hydrate well Eat regular healthy meals Stay active Use relaxation techniques(deep breathing, meditating, yoga) Do Not substitute Alcohol to help with tapering If you have been on opioids for less than two weeks and do not have pain than it is ok to stop all together.  Plan to wean off of opioids This plan should start within one week post op of your joint replacement. Maintain the same interval or time between taking each dose and first decrease the dose.  Cut the total daily intake of opioids by one tablet each day Next start to increase the time between doses. The last dose that should be eliminated is the evening dose.   Per [facility] clinic policy, our goal is ensure optimal postoperative pain control with a multimodal pain management strategy. For all [facility] patients, our goal is to wean post-operative narcotic medications by 6 weeks post-operatively. If this is not possible due to utilization of pain medication prior to surgery, your [facility] doctor will support your acute post-operative pain control for the first 6 weeks postoperatively, with a plan to transition you back to your primary pain team following that.  Cyndia Skeeters will work to ensure a Therapist, occupational.   MAKE SURE YOU:  Understand these instructions.  Get help right away if you are not doing well or get worse.    Thank you  for letting us be a part of your medical care team.  It is a privilege we respect greatly.  We hope these instructions will help you stay on track for a fast and full recovery!

## 2023-07-23 NOTE — Anesthesia Preprocedure Evaluation (Addendum)
Anesthesia Evaluation    Reviewed: Allergy & Precautions, Patient's Chart, lab work & pertinent test results  History of Anesthesia Complications Negative for: history of anesthetic complications  Airway Mallampati: II  TM Distance: >3 FB Neck ROM: Full    Dental no notable dental hx.    Pulmonary asthma , sleep apnea    Pulmonary exam normal breath sounds clear to auscultation       Cardiovascular hypertension, Pt. on medications and Pt. on home beta blockers + CAD and + DVT  Normal cardiovascular exam Rhythm:Regular Rate:Normal   1. Left ventricular ejection fraction, by estimation, is 60 to 65%. The  left ventricle has normal function. The left ventricle has no regional  wall motion abnormalities. Left ventricular diastolic parameters are  consistent with Grade I diastolic  dysfunction (impaired relaxation).   2. Right ventricular systolic function is normal. The right ventricular  size is normal. Tricuspid regurgitation signal is inadequate for assessing  PA pressure.   3. The mitral valve is normal in structure. No evidence of mitral valve  regurgitation. No evidence of mitral stenosis.   4. The aortic valve is tricuspid. Aortic valve regurgitation is not  visualized. No aortic stenosis is present.   5. The inferior vena cava is normal in size with greater than 50%  respiratory variability, suggesting right atrial pressure of 3 mmHg.     Neuro/Psych  Headaches PSYCHIATRIC DISORDERS  Depression Bipolar Disorder Schizophrenia   Schizoaffective d/o  Neuromuscular disease CVA    GI/Hepatic negative GI ROS, Neg liver ROS,,,  Endo/Other    Renal/GU Renal disease     Musculoskeletal  (+) Arthritis ,  Fibromyalgia -  Abdominal   Peds  Hematology  (+) Blood dyscrasia, anemia   Anesthesia Other Findings Plt 110  Reproductive/Obstetrics                             Anesthesia  Physical Anesthesia Plan  ASA: 3  Anesthesia Plan: Spinal   Post-op Pain Management: Tylenol PO (pre-op)*   Induction:   PONV Risk Score and Plan: 2 and Treatment may vary due to age or medical condition and Propofol infusion  Airway Management Planned: Natural Airway and Simple Face Mask  Additional Equipment: None  Intra-op Plan:   Post-operative Plan:   Informed Consent:   Plan Discussed with: CRNA and Anesthesiologist  Anesthesia Plan Comments: (See PAT note )        Anesthesia Quick Evaluation

## 2023-07-23 NOTE — Anesthesia Procedure Notes (Signed)
Spinal  Patient location during procedure: OR Start time: 07/23/2023 10:05 AM End time: 07/23/2023 10:09 AM Reason for block: surgical anesthesia Staffing Performed: anesthesiologist  Anesthesiologist: Altavista Nation, MD Performed by: Odin Nation, MD Authorized by:  Nation, MD   Preanesthetic Checklist Completed: patient identified, IV checked, site marked, risks and benefits discussed, surgical consent, monitors and equipment checked, pre-op evaluation and timeout performed Spinal Block Patient position: sitting Prep: DuraPrep Patient monitoring: heart rate, cardiac monitor, continuous pulse ox and blood pressure Approach: midline Location: L4-5 Needle Needle type: Pencan  Needle gauge: 24 G Assessment Sensory level: T6 Additional Notes Functioning IV was confirmed and monitors were applied. Sterile prep and drape, including hand hygiene and sterile gloves were used. The patient was positioned and the spine was prepped. The skin was anesthetized with lidocaine.  Free flow of clear CSF was obtained prior to injecting local anesthetic into the CSF.  The spinal needle aspirated freely following injection.  The needle was carefully withdrawn.  The patient tolerated the procedure well.
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
## 2023-07-23 NOTE — Evaluation (Signed)
Physical Therapy Evaluation Patient Details Name: Veronica Baker MRN: [id number] DOB: 1951/08/09 Today's Date: 07/23/2023  History of Present Illness  Pt is a 72 y.o. female who presents to [facility] for elective R THA. PMH includes HTN, HLD, chronic LE lymphedema, CVA, DVT, and L THA.  Clinical Impression  Pt received in supine and agreeable to PT session. The pt reported to PT with deficits in strength, balance, mobility, and activity tolerance. The pt required totalA x2 to stand from the EOB with an elevated surface. The pt was able to clear her bottom off the EOB, but not fully stand. Pt required total assistance for all bed mobility due to the generalized weakness. PT observed a right lateral lean during sitting. Pt required physical assist to attempt sitting upright. PT recommends short term inpatient physical therapy services due to the amount of physical assist the pt require. The pt will continue to benefit form skilled PT to address remaining functional deficits and improve QOL.       If plan is discharge home, recommend the following: A lot of help with walking and/or transfers;A lot of help with bathing/dressing/bathroom;Assistance with cooking/housework;Assist for transportation;Help with stairs or ramp for entrance   Can travel by private vehicle   No    Equipment Recommendations None recommended by PT  Recommendations for Other Services       Functional Status Assessment Patient has had a recent decline in their functional status and demonstrates the ability to make significant improvements in function in a reasonable and predictable amount of time.     Precautions / Restrictions Precautions Precautions: Fall Restrictions Weight Bearing Restrictions: Yes RLE Weight Bearing: Weight bearing as tolerated      Mobility  Bed Mobility Overal bed mobility: Needs Assistance Bed Mobility: Supine to Sit, Sit to Supine     Supine to sit: Total assist, HOB elevated, Used  rails Sit to supine: Total assist, +2 for physical assistance, HOB elevated   General bed mobility comments: Pt required totalA due to generalized weakness    Transfers Overall transfer level: Needs assistance Equipment used: 2 person hand held assist Transfers: Sit to/from Stand Sit to Stand: Total assist, +2 physical assistance, +2 safety/equipment, From elevated surface           General transfer comment: Pt was able to clear bottom from the bed with totalA+2, but was unable to stand fully    Ambulation/Gait                  Stairs            Wheelchair Mobility     Tilt Bed    Modified Rankin (Stroke Patients Only)       Balance Overall balance assessment: Needs assistance Sitting-balance support: Bilateral upper extremity supported, Feet supported Sitting balance-Leahy Scale: Poor Sitting balance - Comments: Pt required BUE support and external support from PT to remaining sitting upright Postural control: Right lateral lean Standing balance support: Bilateral upper extremity supported, During functional activity (reliant on therapist for support) Standing balance-Leahy Scale: Zero Standing balance comment: TotalAx2 to clear bottom from EOB                             Pertinent Vitals/Pain Pain Assessment Pain Assessment: 0-10 Pain Score: 8  Pain Location: R hip Pain Descriptors / Indicators: Guarding Pain Intervention(s): Monitored during session    Home Living Family/patient expects to be discharged to:: Other (Comment) (  Independent living facility) Living Arrangements: Alone                      Prior Function Prior Level of Function : Needs assist       Physical Assist : Mobility (physical);ADLs (physical) Mobility (physical): Gait ADLs (physical): Grooming;Bathing;Dressing Mobility Comments: Pt utilized WC and scooter for mobility. Pt reported ability to stand pivot alone. Pt has not ambulated in a year ADLs  Comments: Pt receives help with cooking, cleaning, and bathing.     Extremity/Trunk Assessment   Upper Extremity Assessment Upper Extremity Assessment: Generalized weakness    Lower Extremity Assessment Lower Extremity Assessment: Generalized weakness (Pt presented with weakness and decreased knee extension in BLE.)    Cervical / Trunk Assessment Cervical / Trunk Assessment: Kyphotic  Communication   Communication Communication: Difficulty communicating thoughts/reduced clarity of speech (reduced clarity of speech) Cueing Techniques: Verbal cues;Tactile cues;Visual cues  Cognition Arousal: Alert Behavior During Therapy: WFL for tasks assessed/performed Overall Cognitive Status: Within Functional Limits for tasks assessed                                          General Comments      Exercises     Assessment/Plan    PT Assessment Patient needs continued PT services  PT Problem List Decreased strength;Decreased range of motion;Decreased activity tolerance;Decreased balance;Decreased mobility;Decreased coordination;Pain;Decreased safety awareness       PT Treatment Interventions DME instruction;Gait training;Functional mobility training;Therapeutic activities;Therapeutic exercise;Balance training;Neuromuscular re-education    PT Goals (Current goals can be found in the Care Plan section)  Acute Rehab PT Goals Patient Stated Goal: To get better PT Goal Formulation: With patient Time For Goal Achievement: 08/06/23 Potential to Achieve Goals: Fair    Frequency Min 1X/week     Co-evaluation               AM-PAC PT "6 Clicks" Mobility  Outcome Measure Help needed turning from your back to your side while in a flat bed without using bedrails?: Total Help needed moving from lying on your back to sitting on the side of a flat bed without using bedrails?: Total Help needed moving to and from a bed to a chair (including a wheelchair)?: Total Help  needed standing up from a chair using your arms (e.g., wheelchair or bedside chair)?: Total Help needed to walk in hospital room?: Total Help needed climbing 3-5 steps with a railing? : Total 6 Click Score: 6    End of Session Equipment Utilized During Treatment: Gait belt Activity Tolerance: Patient limited by pain Patient left: in bed;with call bell/phone within reach;with family/visitor present Nurse Communication: Mobility status PT Visit Diagnosis: Unsteadiness on feet (R26.81);Repeated falls (R29.6);Muscle weakness (generalized) (M62.81);History of falling (Z91.81);Difficulty in walking, not elsewhere classified (R26.2);Pain Pain - Right/Left: Right Pain - part of body: Hip    Time: 4098-1191 PT Time Calculation (min) (ACUTE ONLY): 48 min   Charges:   PT Evaluation $PT Eval Low Complexity: 1 Low   PT General Charges $$ ACUTE PT VISIT: 1 Visit        Caryl Comes, SPT Acute Rehabilitation Office Phone [phone]   Caryl Comes 07/23/2023, 5:15 PM

## 2023-07-23 NOTE — Anesthesia Procedure Notes (Signed)
Procedure Name: Intubation Date/Time: 07/23/2023 10:22 AM  Performed by: Shary Decamp, CRNAPre-anesthesia Checklist: Patient identified, Patient being monitored, Timeout performed, Emergency Drugs available and Suction available Patient Re-evaluated:Patient Re-evaluated prior to induction Oxygen Delivery Method: Circle System Utilized Preoxygenation: Pre-oxygenation with 100% oxygen Induction Type: IV induction Ventilation: Mask ventilation without difficulty Laryngoscope Size: Miller and 2 Grade View: Grade I Tube type: Oral Tube size: 7.0 mm Number of attempts: 1 Airway Equipment and Method: Stylet Placement Confirmation: ETT inserted through vocal cords under direct vision, positive ETCO2 and breath sounds checked- equal and bilateral Secured at: 21 cm Tube secured with: Tape Dental Injury: Teeth and Oropharynx as per pre-operative assessment

## 2023-07-23 NOTE — Anesthesia Postprocedure Evaluation (Signed)
Anesthesia Post Note  Patient: Veronica Baker  Procedure(s) Performed: TOTAL HIP ARTHROPLASTY ANTERIOR APPROACH (Right: Hip)     Patient location during evaluation: PACU Anesthesia Type: Spinal and General Level of consciousness: awake and alert Pain management: pain level controlled Vital Signs Assessment: post-procedure vital signs reviewed and stable Respiratory status: spontaneous breathing, nonlabored ventilation, respiratory function stable and patient connected to nasal cannula oxygen Cardiovascular status: blood pressure returned to baseline and stable Postop Assessment: no apparent nausea or vomiting Anesthetic complications: no Comments: Converted from spinal to General at beginning of case to facilitate surgical mobility.    No notable events documented.  Last Vitals:  Vitals:   07/23/23 0805  BP: 125/78  Pulse: 88  Resp: 18  Temp: 36.7 C  SpO2: 99%    Last Pain:  Vitals:   07/23/23 0805  TempSrc: Oral                 [place]

## 2023-07-23 NOTE — Op Note (Signed)
TOTAL HIP ARTHROPLASTY ANTERIOR APPROACH  Procedure Note Veronica Baker   [id number]  Pre-op Diagnosis: right hip osteoarthritis     Post-op Diagnosis: same  Operative Findings Bilateral hip flexion contracture 20 degrees Complete loss of joint space   Operative Procedures  1. Total hip replacement; Right hip; uncemented [id number]  2. Application of incisional VAC. CPT 86578  Surgeon: Gershon Mussel, M.D.  Assist: Oneal Grout, PA-C   Anesthesia: general, spinal  Prosthesis: Depuy Acetabulum: Pinnacle 52 mm Femur: Actis 6 STD Head: 36 mm size: +1.5 Liner: +4 Bearing Type: metal/poly  Total Hip Arthroplasty (Anterior Approach) Op Note:  After informed consent was obtained and the operative extremity marked in the holding area, the patient was brought back to the operating room and placed supine on the HANA table. Next, the operative extremity was prepped and draped in normal sterile fashion. Surgical timeout occurred verifying patient identification, surgical site, surgical procedure and administration of antibiotics.  A 10 cm longitudinal incision was made starting from 2 fingerbreadths lateral and inferior to the ASIS towards the lateral aspect of the patella.  A Hueter approach to the hip was performed, using the interval between tensor fascia lata and sartorius.  Dissection was carried bluntly down onto the anterior hip capsule.  The anterior hip capsule was contracted.  The lateral femoral circumflex vessels were identified and coagulated.  Capsulectomy was performed.  The neck osteotomy was performed approximately 1 fingerbreadth above the lesser trochanter. The femoral head was removed which showed severe wear, the acetabular rim was cleared of soft tissue and osteophytes and attention was turned to reaming the acetabulum.  Sequential reaming was performed under fluoroscopic guidance down to the floor of the cotyloid fossa. We reamed to a size 51 mm, and then impacted  the acetabular shell.  The liner was then placed after irrigation and attention turned to the femur.  After placing the femoral hook, the leg was taken to externally rotated, extended and adducted position taking care to perform soft tissue releases to allow for adequate mobilization of the femur.  The pubofemoral ligaments and the iliopsoas were released in order to allow the hip to fully extend.  Soft tissue was cleared from the shoulder of the greater trochanter and the hook elevator used to improve exposure of the proximal femur. Sequential broaching performed up to a size 6. Trial high offset neck and head were placed. The leg was brought back up to neutral.  I was unable to safely reduce the hip so we swapped it for a standard offset trial neck which then reduced nicely.  The position and sizing of components, offset and leg lengths were checked using fluoroscopy. Stability of the construct was checked in 45 degrees of hip extension and 90 degrees of external rotation without any subluxation, shuck or impingement of prosthesis. We dislocated the prosthesis, dropped the leg back into position, removed trial components, and irrigated copiously. The final stem and head was then placed, the leg brought back up, the system reduced and fluoroscopy used to verify positioning.  Antibiotic irrigation was placed in the surgical wound.   We irrigated, obtained hemostasis and closed the capsule using #2 ethibond suture.  A topical mixture of 0.25% bupivacaine and meloxicam was placed deep to the fascia.  One gram of vancomycin powder was placed in the surgical bed.   One gram of topical tranexamic acid was injected into the joint.  The fascia was closed with #1 vicryl plus, the deep fat layer  was closed with 0 vicryl, the subcutaneous layers closed with 2.0 Vicryl Plus and the skin closed with 2.0 nylon and incisional VAC due to overhanging pannus and fact that the patient spends a fair amount of time in a seated  position in an electric wheelchair. A sterile dressing was applied. The patient was awakened in the operating room and taken to recovery in stable condition.  All sponge, needle, and instrument counts were correct at the end of the case.   Tessa Lerner, my PA, was a medical necessity for opening, closing, limb positioning, retracting, exposing, and overall facilitation and timely completion of the surgery.  Position: supine  Complications: see description of procedure.  Time Out: performed   Drains/Packing: none  Estimated blood loss: see anesthesia record  Returned to Recovery Room: in good condition.   Antibiotics: yes   Mechanical VTE (DVT) Prophylaxis: sequential compression devices, TED thigh-high  Chemical VTE (DVT) Prophylaxis: aspirin   Fluid Replacement: see anesthesia record  Specimens Removed: 1 to pathology   Sponge and Instrument Count Correct? yes   PACU: portable radiograph - low AP   Plan/RTC: Return in 2 weeks for staple removal. Weight Bearing/Load Lower Extremity: full  Hip precautions: none Suture Removal: 2 weeks   N. Glee Arvin, MD [facility] 11:40 AM   Implant Name Type Inv. Item Serial No. Manufacturer Lot No. LRB No. Used Action  PIN SECTOR W/GRIP ACE CUP - [id number] Hips PIN SECTOR W/GRIP ACE CUP  [employer] [id number] Right 1 Implanted  LINER NEUTRAL 52X36MM PLUS 4 - [id number] Liner LINER NEUTRAL [id number] PLUS 4  [employer] [id number] Right 1 Implanted  STEM FEMORAL SZ STD ACTIS - [id number] Stem STEM FEMORAL SZ STD ACTIS  [employer] [id number] Right 1 Implanted  SROM M HEAD PLUS 1.5 - [id number] Hips SROM M HEAD PLUS 1.5  [employer] [id number] Right 1 Implanted

## 2023-07-24 DIAGNOSIS — M1611 Unilateral primary osteoarthritis, right hip: Secondary | ICD-10-CM | POA: Diagnosis not present

## 2023-07-24 LAB — CBC
HCT: 27.8 % — ABNORMAL LOW (ref 36.0–46.0)
Hemoglobin: 9.1 g/dL — ABNORMAL LOW (ref 12.0–15.0)
MCH: 28.5 pg (ref 26.0–34.0)
MCHC: 32.7 g/dL (ref 30.0–36.0)
MCV: 87.1 fL (ref 80.0–100.0)
Platelets: 120 10*3/uL — ABNORMAL LOW (ref 150–400)
RBC: 3.19 MIL/uL — ABNORMAL LOW (ref 3.87–5.11)
RDW: 12.7 % (ref 11.5–15.5)
WBC: 9.5 10*3/uL (ref 4.0–10.5)
nRBC: 0 % (ref 0.0–0.2)
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
## 2023-07-24 NOTE — Progress Notes (Addendum)
Physical Therapy Treatment Patient Details Name: Veronica Baker MRN: [id number] DOB: 14-Apr-1951 Today's Date: 07/24/2023   History of Present Illness Pt is a 72 y.o. female who presents to [facility] for elective R THA. PMH includes HTN, HLD, chronic LE lymphedema, CVA, DVT, and L THA.    PT Comments  Pt supine in bed on arrival and RN bladder scanning her as she is unable to urinate.  PTA moved OOB after bladder scan in hopes she would be able to void.  Pt seated on commode at conclusion of session in hopes to void.  During transfers she continues to move slowly with audible crepitus and decreased use of R shoulder.  Pt remains to benefit from rehab in a post acute setting.      If plan is discharge home, recommend the following: A lot of help with walking and/or transfers;A lot of help with bathing/dressing/bathroom;Assistance with cooking/housework;Assist for transportation;Help with stairs or ramp for entrance   Can travel by private vehicle     No  Equipment Recommendations  None recommended by PT    Recommendations for Other Services       Precautions / Restrictions Precautions Precautions: Fall Restrictions Weight Bearing Restrictions: Yes RLE Weight Bearing: Weight bearing as tolerated     Mobility  Bed Mobility Overal bed mobility: Needs Assistance Bed Mobility: Supine to Sit     Supine to sit: Max assist (used bed pad to advance to edge of bed.)     General bed mobility comments: Pt required assistance to advance B LEs to edge of bed.  Assistance to move trunk into seated position.  Audible crepitus with mobility.    Transfers Overall transfer level: Needs assistance Equipment used: Ambulation equipment used (stedy) Transfers: Sit to/from Stand Sit to Stand: Mod assist           General transfer comment: Pt performed from bed to stand in sara stedy with bed height elevated.  Pt required max cues for hand placement and cues for technique to rise into standing.   Once in standing plates placed and moved from bed to [facility]  Pt required mod assistance to return to seated surface from standing during transfer to commode.    Ambulation/Gait Ambulation/Gait assistance:  (remains unable.)                 Stairs             Wheelchair Mobility     Tilt Bed    Modified Rankin (Stroke Patients Only)       Balance     Sitting balance-Leahy Scale: Poor Sitting balance - Comments: Pt required BUE support and external support from PT to remaining sitting upright R lateral Lean     Standing balance-Leahy Scale: Poor Standing balance comment: R lateral lean                            Cognition Arousal: Alert Behavior During Therapy: WFL for tasks assessed/performed Overall Cognitive Status: No family/caregiver present to determine baseline cognitive functioning                                          Exercises Total Joint Exercises Ankle Circles/Pumps: AROM, Both, 20 reps, Supine Quad Sets: AROM, Right, 10 reps, Supine Heel Slides: Right, 10 reps, Supine, AAROM Hip ABduction/ADduction: AAROM, Right, 10  reps, Supine    General Comments        Pertinent Vitals/Pain Pain Assessment Pain Assessment: 0-10 Pain Score: 8  Pain Location: R hip, bilateral ankles ( ankles are tender to palpation ) Pain Descriptors / Indicators: Guarding Pain Intervention(s): Monitored during session, Repositioned    Home Living                          Prior Function            PT Goals (current goals can now be found in the care plan section) Acute Rehab PT Goals Patient Stated Goal: To get better Potential to Achieve Goals: Fair Progress towards PT goals: Progressing toward goals    Frequency    Min 1X/week      PT Plan      Co-evaluation              AM-PAC PT "6 Clicks" Mobility   Outcome Measure  Help needed turning from your back to your side while in a flat bed without  using bedrails?: A Lot Help needed moving from lying on your back to sitting on the side of a flat bed without using bedrails?: A Lot Help needed moving to and from a bed to a chair (including a wheelchair)?: A Lot Help needed standing up from a chair using your arms (e.g., wheelchair or bedside chair)?: A Lot Help needed to walk in hospital room?: A Lot Help needed climbing 3-5 steps with a railing? : A Lot 6 Click Score: 12    End of Session Equipment Utilized During Treatment: Gait belt Activity Tolerance: Patient limited by pain Patient left: with call bell/phone within reach;with family/visitor present;Other (comment) (on commode attempting to urinate.) Nurse Communication: Mobility status PT Visit Diagnosis: Unsteadiness on feet (R26.81);Repeated falls (R29.6);Muscle weakness (generalized) (M62.81);History of falling (Z91.81);Difficulty in walking, not elsewhere classified (R26.2);Pain Pain - Right/Left: Right Pain - part of body: Hip     Time: 4132-4401 PT Time Calculation (min) (ACUTE ONLY): 12 min  Charges:     $Therapeutic Activity: 8-22 mins PT General Charges $$ ACUTE PT VISIT: 1 Visit                     Veronica Baker , PTA Acute Rehabilitation Services Office [phone]    Veronica Baker 07/24/2023, 4:42 PM

## 2023-07-24 NOTE — Social Work (Signed)
                                                                                                                           Re: Veronica Baker Date of Birth:Aug 07, 1951 Date:07/24/2023  To Whom It May Concern:  Please be advised that the above-named patient will require a short-term nursing home stay-anticipated 30 days or less for rehabilitation and strengthening. The plan is to return home.
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
## 2023-07-24 NOTE — Progress Notes (Addendum)
Physical Therapy Treatment Patient Details Name: Veronica Baker MRN: [id number] DOB: 02/10/1951 Today's Date: 07/24/2023   History of Present Illness Pt is a 72 y.o. female who presents to [facility] for elective R THA. PMH includes HTN, HLD, chronic LE lymphedema, CVA, DVT, and L THA.    PT Comments  Pt supine in bed.  She reports yesterday did not go well and expressed some nervousness about trying today.  Used sit to stand stedy frame to instill some confidence in her abilities.  She continues to require mod-max assistance.  Pt would benefit from continued rehab in a post acute setting.     If plan is discharge home, recommend the following: A lot of help with walking and/or transfers;A lot of help with bathing/dressing/bathroom;Assistance with cooking/housework;Assist for transportation;Help with stairs or ramp for entrance   Can travel by private vehicle     No  Equipment Recommendations  None recommended by PT    Recommendations for Other Services       Precautions / Restrictions Precautions Precautions: Fall Restrictions Weight Bearing Restrictions: Yes RLE Weight Bearing: Weight bearing as tolerated     Mobility  Bed Mobility Overal bed mobility: Needs Assistance Bed Mobility: Supine to Sit, Sit to Supine     Supine to sit: Max assist, Used rails (use of bed pad to move to edge of bed,)          Transfers Overall transfer level: Needs assistance Equipment used: Ambulation equipment used (stedy) Transfers: Sit to/from Stand Sit to Stand: Mod assist (with use of sara stedy, total assistance with RW)           General transfer comment: Pt was slow to rise from elevated height of bed with use of sara stedy sit to stand frame.  Cues for hand placement.  Presents with R L side bend.    Ambulation/Gait Ambulation/Gait assistance:  (unable.)                 Stairs             Wheelchair Mobility     Tilt Bed    Modified Rankin (Stroke  Patients Only)       Balance     Sitting balance-Leahy Scale: Poor Sitting balance - Comments: Pt required BUE support and external support from PT to remaining sitting upright R lateral Lean     Standing balance-Leahy Scale: Poor Standing balance comment: R lateral lean                            Cognition Arousal: Alert Behavior During Therapy: WFL for tasks assessed/performed Overall Cognitive Status: No family/caregiver present to determine baseline cognitive functioning                                          Exercises Total Joint Exercises Ankle Circles/Pumps: AROM, Both, 20 reps, Supine Quad Sets: AROM, Right, 10 reps, Supine Heel Slides: Right, 10 reps, Supine, AAROM Hip ABduction/ADduction: AAROM, Right, 10 reps, Supine    General Comments        Pertinent Vitals/Pain Pain Assessment Pain Assessment: 0-10 Pain Location: R hip, bilateral ankles Pain Descriptors / Indicators: Guarding Pain Intervention(s): Monitored during session, Repositioned    Home Living  Prior Function            PT Goals (current goals can now be found in the care plan section) Acute Rehab PT Goals Patient Stated Goal: To get better Potential to Achieve Goals: Fair Progress towards PT goals: Progressing toward goals    Frequency    Min 1X/week      PT Plan      Co-evaluation              AM-PAC PT "6 Clicks" Mobility   Outcome Measure  Help needed turning from your back to your side while in a flat bed without using bedrails?: A Lot Help needed moving from lying on your back to sitting on the side of a flat bed without using bedrails?: A Lot Help needed moving to and from a bed to a chair (including a wheelchair)?: Total Help needed standing up from a chair using your arms (e.g., wheelchair or bedside chair)?: Total Help needed to walk in hospital room?: Total Help needed climbing 3-5 steps with  a railing? : Total 6 Click Score: 8    End of Session Equipment Utilized During Treatment: Gait belt Activity Tolerance: Patient limited by pain Patient left: in bed;with call bell/phone within reach;with family/visitor present Nurse Communication: Mobility status PT Visit Diagnosis: Unsteadiness on feet (R26.81);Repeated falls (R29.6);Muscle weakness (generalized) (M62.81);History of falling (Z91.81);Difficulty in walking, not elsewhere classified (R26.2);Pain Pain - Right/Left: Right Pain - part of body: Hip     Time: 1135-1207 PT Time Calculation (min) (ACUTE ONLY): 32 min  Charges:    $Therapeutic Exercise: 8-22 mins $Therapeutic Activity: 8-22 mins PT General Charges $$ ACUTE PT VISIT: 1 Visit                     Bonney Leitz , PTA Acute Rehabilitation Services Office [phone]    Florestine Avers 07/24/2023, 12:51 PM

## 2023-07-24 NOTE — Progress Notes (Addendum)
Physical Therapy Treatment Patient Details Name: Veronica Baker MRN: [id number] DOB: 29-Oct-1950 Today's Date: 07/24/2023   History of Present Illness Pt is a 72 y.o. female who presents to [facility] for elective R THA. PMH includes HTN, HLD, chronic LE lymphedema, CVA, DVT, and L THA.    PT Comments  Pt on commode and was able to urinate.  PTA returned to assist back to bed.  Pt continues to require use of sara stedy to rise into standing.  Pt very painful with movements and reports increase pain in B ankles.  Informed nursing and removed socks due to edema.  Ted hose smoothed out.  Pt continues to require external assistance and equipment to mobilize.  Post acute rehab remains her best option.      If plan is discharge home, recommend the following: A lot of help with walking and/or transfers;A lot of help with bathing/dressing/bathroom;Assistance with cooking/housework;Assist for transportation;Help with stairs or ramp for entrance   Can travel by private vehicle     No  Equipment Recommendations  None recommended by PT    Recommendations for Other Services       Precautions / Restrictions Precautions Precautions: Fall Restrictions Weight Bearing Restrictions: Yes RLE Weight Bearing: Weight bearing as tolerated     Mobility  Bed Mobility Overal bed mobility: Needs Assistance Bed Mobility: Sit to Supine     Supine to sit: Max assist (used bed pad to advance to edge of bed.) Sit to supine: Total assist, +2 for physical assistance, HOB elevated   General bed mobility comments: Pt required total assistance to move back to bed as she is unable to lift B LEs against gravity.  +2 to boost in supine and position.  Pt very painful with movement.    Transfers Overall transfer level: Needs assistance Equipment used: Ambulation equipment used (stedy) Transfers: Sit to/from Stand Sit to Stand: Mod assist (in sara stedy unable to stand without device.)           General transfer  comment: Pt performed from bed side commode to stand in sara stedy with seat height elevated.  Pt required max cues for hand placement and cues for technique to rise into standing.  Once in standing plates placed and moved from bed side commode back to Bed.  Pt required mod assistance to return to seated surface from standing during transfer to seated surface of bed.    Ambulation/Gait Ambulation/Gait assistance:  (remains unable.)                 Stairs             Wheelchair Mobility     Tilt Bed    Modified Rankin (Stroke Patients Only)       Balance Overall balance assessment: Needs assistance Sitting-balance support: Bilateral upper extremity supported, Feet supported Sitting balance-Leahy Scale: Poor Sitting balance - Comments: Pt required BUE support and external support from PT to remaining sitting upright R lateral Lean Postural control: Right lateral lean   Standing balance-Leahy Scale: Poor Standing balance comment: R lateral lean                            Cognition Arousal: Alert Behavior During Therapy: WFL for tasks assessed/performed Overall Cognitive Status: No family/caregiver present to determine baseline cognitive functioning  Exercises Total Joint Exercises Ankle Circles/Pumps: AROM, Both, 20 reps, Supine Quad Sets: AROM, Right, 10 reps, Supine Heel Slides: Right, 10 reps, Supine, AAROM Hip ABduction/ADduction: AAROM, Right, 10 reps, Supine    General Comments        Pertinent Vitals/Pain Pain Assessment Pain Assessment: 0-10 Pain Score: 8  Pain Location: R hip, bilateral ankles ( ankles are tender to palpation ) Pain Descriptors / Indicators: Guarding Pain Intervention(s): Monitored during session, Repositioned    Home Living                          Prior Function            PT Goals (current goals can now be found in the care plan section)  Acute Rehab PT Goals Patient Stated Goal: To get better Potential to Achieve Goals: Fair Progress towards PT goals: Progressing toward goals    Frequency    Min 1X/week      PT Plan      Co-evaluation              AM-PAC PT "6 Clicks" Mobility   Outcome Measure  Help needed turning from your back to your side while in a flat bed without using bedrails?: A Lot Help needed moving from lying on your back to sitting on the side of a flat bed without using bedrails?: A Lot Help needed moving to and from a bed to a chair (including a wheelchair)?: A Lot Help needed standing up from a chair using your arms (e.g., wheelchair or bedside chair)?: A Lot Help needed to walk in hospital room?: A Lot Help needed climbing 3-5 steps with a railing? : A Lot 6 Click Score: 12    End of Session Equipment Utilized During Treatment: Gait belt Activity Tolerance: Patient limited by pain Patient left: in bed;with call bell/phone within reach;with bed alarm set;with family/visitor present;with nursing/sitter in room Nurse Communication: Mobility status PT Visit Diagnosis: Unsteadiness on feet (R26.81);Repeated falls (R29.6);Muscle weakness (generalized) (M62.81);History of falling (Z91.81);Difficulty in walking, not elsewhere classified (R26.2);Pain Pain - Right/Left: Right Pain - part of body: Hip     Time: 1610-9604 PT Time Calculation (min) (ACUTE ONLY): 8 min  Charges:    $Therapeutic Activity: 8-22 mins PT General Charges $$ ACUTE PT VISIT: 1 Visit                     Bonney Leitz , PTA Acute Rehabilitation Services Office [phone]    Florestine Avers 07/24/2023, 4:51 PM

## 2023-07-24 NOTE — Progress Notes (Signed)
   Subjective:  Patient reports pain as mild.  No events.  Objective:   VITALS:   Vitals:   07/23/23 2031 07/23/23 2342 07/24/23 0302 07/24/23 0726  BP: 121/81 120/75 95/65 102/63  Pulse: 89 85 84 89  Resp: 16 15 15 17   Temp: (!) 97.4 F (36.3 C) 97.9 F (36.6 C) 98 F (36.7 C) 98.2 F (36.8 C)  TempSrc: Oral   Oral  SpO2: 98% 99% 100% 100%  Weight:      Height:        Neurovascular intact Sensation intact distally Intact pulses distally Dorsiflexion/Plantar flexion intact   Lab Results  Component Value Date   WBC 5.4 07/16/2023   HGB 11.6 (L) 07/16/2023   HCT 37.4 07/16/2023   MCV 89.9 07/16/2023   PLT 138 (L) 07/16/2023     Assessment/Plan:  1 Day Post-Op   - Expected postop acute blood loss anemia, recheck CBC today - Up with PT/OT - DVT ppx - SCDs, ambulation, aspirin - WBAT operative extremity - Pain control - Discharge planning - plan is to go to SNF - continue iVAC for 1 week  Glee Arvin 07/24/2023, 8:50 AM

## 2023-07-25 ENCOUNTER — Encounter (HOSPITAL_COMMUNITY): Payer: Self-pay | Admitting: Orthopaedic Surgery

## 2023-07-25 DIAGNOSIS — M1611 Unilateral primary osteoarthritis, right hip: Secondary | ICD-10-CM | POA: Diagnosis not present

## 2023-07-25 NOTE — Progress Notes (Signed)
   Subjective:  Patient reports pain as controlled, sitting upright  Objective:   VITALS:   Vitals:   07/24/23 1410 07/24/23 2048 07/25/23 0512 07/25/23 0826  BP: 106/71 102/61 115/71 116/71  Pulse: 81 77 87 86  Resp: 17 16 16 16   Temp: 97.9 F (36.6 C) 97.8 F (36.6 C) 97.7 F (36.5 C) 98.3 F (36.8 C)  TempSrc: Oral Oral Oral Oral  SpO2: 100% 99% 100% 100%  Weight:      Height:        Neurovascular intact Sensation intact distally Intact pulses distally Dorsiflexion/Plantar flexion intact iVAC in place, well sealed   Lab Results  Component Value Date   WBC 9.5 07/24/2023   HGB 9.1 (L) 07/24/2023   HCT 27.8 (L) 07/24/2023   MCV 87.1 07/24/2023   PLT 120 (L) 07/24/2023     Assessment/Plan:  2 Days Post-Op   - Expected postop acute blood loss anemia - Up with PT/OT - DVT ppx - SCDs, ambulation, aspirin - WBAT operative extremity - Pain control - Discharge planning - plan is to go to SNF - continue iVAC for 1 week  Kippy Gohman 07/25/2023, 9:41 AM

## 2023-07-25 NOTE — Progress Notes (Signed)
Physical Therapy Treatment Patient Details Name: Veronica Baker MRN: [id number] DOB: [birth year] Today's Date: 07/25/2023   History of Present Illness Pt is a 72 y.o. female who presents to [facility] for elective R THA. PMH includes HTN, HLD, chronic LE lymphedema, CVA, DVT, and L THA.    PT Comments  Patient progressing slowly but able to stand easier and practiced multiple sit to stands from height of Stedy.  She needs cues and facilitation for upright posture in standing and for positioning in recliner due to leans to R.  PT will continue to follow in the acute setting.  Post-acute inpatient rehab (<3 hours/day) recommendation remains appropriate.     If plan is discharge home, recommend the following: A lot of help with walking and/or transfers;A lot of help with bathing/dressing/bathroom;Assistance with cooking/housework;Assist for transportation;Help with stairs or ramp for entrance   Can travel by private vehicle     No  Equipment Recommendations  None recommended by PT    Recommendations for Other Services       Precautions / Restrictions Precautions Precautions: Fall Restrictions RLE Weight Bearing: Weight bearing as tolerated     Mobility  Bed Mobility Overal bed mobility: Needs Assistance Bed Mobility: Supine to Sit     Supine to sit: +2 for physical assistance, Mod assist, Used rails, HOB elevated     General bed mobility comments: assist to move R LE and to lift trunk, scoot hips to EOB    Transfers Overall transfer level: Needs assistance Equipment used: Ambulation equipment used Transfers: Bed to chair/wheelchair/BSC, Sit to/from Stand Sit to Stand: Mod assist, +2 safety/equipment           General transfer comment: lifting assist to stand and assisted to recliner in [place]; three more sit to stands from seat in [place] then assisted to lower into recliner Transfer via Lift Equipment: Stedy  Ambulation/Gait                   Stairs              Wheelchair Mobility     Tilt Bed    Modified Rankin (Stroke Patients Only)       Balance Overall balance assessment: Needs assistance   Sitting balance-Leahy Scale: Fair Sitting balance - Comments: sitting EOB with S   Standing balance support: Bilateral upper extremity supported Standing balance-Leahy Scale: Poor Standing balance comment: R lateral lean; UE support and PT support for standing in [place]; cues for hips forward and shoulders back                            Cognition Arousal: Alert Behavior During Therapy: WFL for tasks assessed/performed, Flat affect Overall Cognitive Status: No family/caregiver present to determine baseline cognitive functioning                                 General Comments: follows commands well though slow processing,        Exercises Total Joint Exercises Ankle Circles/Pumps: AROM, Both, Supine, 10 reps Other Exercises Other Exercises: manual work on R LE for hip and knee extension when in supine, keeping knee flexed in bed so helped on heel and pt encouraged to extend knee    General Comments General comments (skin integrity, edema, etc.): wearing TEDS in supine, assist for positioning of legs in recliner for support; RN aware transfer back  to bed via [facility]      Pertinent Vitals/Pain Pain Assessment Pain Assessment: Faces Faces Pain Scale: Hurts even more Pain Location: R hip and ankles (tender) Pain Descriptors / Indicators: Guarding, Discomfort, Grimacing Pain Intervention(s): Limited activity within patient's tolerance, Monitored during session, Repositioned    Home Living                          Prior Function            PT Goals (current goals can now be found in the care plan section) Progress towards PT goals: Progressing toward goals    Frequency    Min 1X/week      PT Plan      Co-evaluation              AM-PAC PT "6 Clicks" Mobility   Outcome  Measure  Help needed turning from your back to your side while in a flat bed without using bedrails?: A Lot Help needed moving from lying on your back to sitting on the side of a flat bed without using bedrails?: A Lot Help needed moving to and from a bed to a chair (including a wheelchair)?: A Lot Help needed standing up from a chair using your arms (e.g., wheelchair or bedside chair)?: A Lot Help needed to walk in hospital room?: Total Help needed climbing 3-5 steps with a railing? : Total 6 Click Score: 10    End of Session Equipment Utilized During Treatment: Gait belt Activity Tolerance: Patient limited by pain Patient left: in chair;with bed alarm set;with call bell/phone within reach Nurse Communication: Need for lift equipment;Mobility status PT Visit Diagnosis: History of falling (Z91.81);Difficulty in walking, not elsewhere classified (R26.2);Pain Pain - Right/Left: Right Pain - part of body: Hip     Time: 1040-1110 PT Time Calculation (min) (ACUTE ONLY): 30 min  Charges:    $Therapeutic Exercise: 8-22 mins $Therapeutic Activity: 8-22 mins PT General Charges $$ ACUTE PT VISIT: 1 Visit                     Sheran Lawless, PT Acute Rehabilitation Services Office:[phone] 07/25/2023    Anova Quirke 07/25/2023, 1:20 PM

## 2023-07-26 DIAGNOSIS — M1611 Unilateral primary osteoarthritis, right hip: Secondary | ICD-10-CM | POA: Diagnosis not present

## 2023-07-26 NOTE — Plan of Care (Signed)
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
## 2023-07-26 NOTE — Progress Notes (Signed)
Patient stable.  Pain controlled.  No drainage in canister.  Discharge to SNF when bed is available.

## 2023-07-26 NOTE — Progress Notes (Signed)
Physical Therapy Treatment  Patient Details Name: Veronica Baker MRN: [id number] DOB: 09-03-1950 Today's Date: 07/26/2023   History of Present Illness Pt is a 72 y.o. female who presents to [facility] for elective R THA. PMH includes HTN, HLD, chronic LE lymphedema, CVA, DVT, and L THA.    PT Comments  Pt progressing towards physical therapy goals. Was able to perform transfers with up to +2 mod assist, however continues to demonstrate poor posture and inability to actively advance the RLE in standing.  Stedy utilized for transfer bed>BSC>chair. Pt reports feeling the urge to have a BM however not successful in having one while on BSC. Noted R knee flexion contracture and LE positioned to facilitate knee extension. Will continue to follow and progress as able per POC.    If plan is discharge home, recommend the following: A lot of help with walking and/or transfers;A lot of help with bathing/dressing/bathroom;Assistance with cooking/housework;Assist for transportation;Help with stairs or ramp for entrance   Can travel by private vehicle     No  Equipment Recommendations  None recommended by PT    Recommendations for Other Services       Precautions / Restrictions Precautions Precautions: Fall Restrictions Weight Bearing Restrictions: Yes RLE Weight Bearing: Weight bearing as tolerated     Mobility  Bed Mobility Overal bed mobility: Needs Assistance Bed Mobility: Supine to Sit     Supine to sit: +2 for physical assistance, Mod assist, Used rails, HOB elevated     General bed mobility comments: assist to move R LE and to lift trunk, scoot hips to EOB. Bed pad utilized for scooting and positioning on EOB.    Transfers Overall transfer level: Needs assistance Equipment used: Ambulation equipment used Transfers: Bed to chair/wheelchair/BSC, Sit to/from Stand Sit to Stand: Mod assist, +2 safety/equipment, Via lift equipment           General transfer comment: VC's for hand  placement on center bar of Stedy. Pt able to pull to stand with heavy mod assist. Increased time to achieve full stand. Stood x3 throughout session. Transfer via Lift Equipment: [place]  Ambulation/Gait               General Gait Details: Unable to progress to gait training this session.   Stairs             Wheelchair Mobility     Tilt Bed    Modified Rankin (Stroke Patients Only)       Balance Overall balance assessment: Needs assistance Sitting-balance support: Bilateral upper extremity supported, Feet supported Sitting balance-Leahy Scale: Fair Sitting balance - Comments: sitting EOB with S Postural control: Right lateral lean Standing balance support: Bilateral upper extremity supported Standing balance-Leahy Scale: Poor Standing balance comment: R lateral lean; UE support and PT support for standing in [place]; cues for hips forward and shoulders back                            Cognition Arousal: Alert Behavior During Therapy: WFL for tasks assessed/performed, Flat affect Overall Cognitive Status: No family/caregiver present to determine baseline cognitive functioning                                 General Comments: follows commands well though slow processing,        Exercises Other Exercises Other Exercises: Positioned RLE to promote knee extension.  General Comments        Pertinent Vitals/Pain Pain Assessment Pain Assessment: Faces Faces Pain Scale: Hurts little more Pain Location: R hip and ankles (tender) Pain Descriptors / Indicators: Guarding, Discomfort, Grimacing Pain Intervention(s): Limited activity within patient's tolerance, Monitored during session, Repositioned    Home Living                          Prior Function            PT Goals (current goals can now be found in the care plan section) Acute Rehab PT Goals Patient Stated Goal: To get better PT Goal Formulation: With  patient Time For Goal Achievement: 08/06/23 Potential to Achieve Goals: Fair Progress towards PT goals: Progressing toward goals    Frequency    7X/week      PT Plan      Co-evaluation              AM-PAC PT "6 Clicks" Mobility   Outcome Measure  Help needed turning from your back to your side while in a flat bed without using bedrails?: A Lot Help needed moving from lying on your back to sitting on the side of a flat bed without using bedrails?: A Lot Help needed moving to and from a bed to a chair (including a wheelchair)?: A Lot Help needed standing up from a chair using your arms (e.g., wheelchair or bedside chair)?: A Lot Help needed to walk in hospital room?: Total Help needed climbing 3-5 steps with a railing? : Total 6 Click Score: 10    End of Session Equipment Utilized During Treatment: Gait belt Activity Tolerance: Patient limited by pain Patient left: in chair;with call bell/phone within reach;with chair alarm set Nurse Communication: Need for lift equipment;Mobility status (Stedy; Needs [place]) PT Visit Diagnosis: History of falling (Z91.81);Difficulty in walking, not elsewhere classified (R26.2);Pain Pain - Right/Left: Right Pain - part of body: Hip     Time: 4132-4401 PT Time Calculation (min) (ACUTE ONLY): 36 min  Charges:    $Gait Training: 23-37 mins PT General Charges $$ ACUTE PT VISIT: 1 Visit                     Conni Slipper, PT, DPT Acute Rehabilitation Services Secure Chat Preferred Office: [phone]    Veronica Baker 07/26/2023, 1:02 PM

## 2023-07-26 NOTE — TOC Progression Note (Addendum)
Transition of Care [facility]) - Progression Note    Patient Details  Name: Veronica Baker MRN: [id number] Date of Birth: 1950-11-08  [facility] of [facility]) CM/SW Contact  Lorri Frederick, LCSW Phone Number: 07/26/2023, 9:46 AM  Clinical Narrative:   SNF referral has not yet been sent out in the hub, sent out at this time.  PASSR docs uploaded in [place].   1140: Passr received: [id number] E  1220: Bed offers provided to pt on medicare choice document.  She will review.  Expected Discharge Plan: Skilled Nursing Facility Barriers to Discharge: English as a second language teacher, Continued Medical Work up, SNF Pending bed offer  Expected Discharge Plan and Services In-house Referral: Clinical Social Work Discharge Planning Services: CM Consult   Living arrangements for the past 2 months: Apartment Expected Discharge Date: 07/26/23                                     Social Determinants of Health (SDOH) Interventions SDOH Screenings   Food Insecurity: No Food Insecurity (07/23/2023)  Housing: Low Risk  (07/23/2023)  Transportation Needs: Unmet Transportation Needs (07/23/2023)  Utilities: Not At Risk (07/23/2023)  Financial Resource Strain: Low Risk  (09/14/2022)   Received from [facility], [facility]  Physical Activity: Insufficiently Active (08/15/2021)   Received from [facility], [facility]  Social Connections: Unknown (12/28/2021)   Received from [facility], [facility]  Stress: No Stress Concern Present (08/15/2021)   Received from [facility], [facility]  Tobacco Use: Low Risk  (07/23/2023)    Readmission Risk Interventions     No data to display

## 2023-07-27 DIAGNOSIS — M1611 Unilateral primary osteoarthritis, right hip: Secondary | ICD-10-CM | POA: Diagnosis not present

## 2023-07-27 NOTE — TOC Progression Note (Addendum)
Transition of Care [facility]) - Progression Note    Patient Details  Name: Veronica Baker MRN: [id number] Date of Birth: June 21, 1951  [facility]) CM/SW Contact  Lorri Frederick, LCSW Phone Number: 07/27/2023, 11:35 AM  Clinical Narrative:   CSW spoke with pt regarding bed offers.  She did speak with her son, they want to accept offer at [facility].  CSW left son message to confirm. CSW waiting on confirmation from [facility] regarding whether they can receive pt today.    Attempted Auth request in [employer]-error message, called [employer] and they do not manage this pt for SNF auth.  CSW reached out to [facility] to start auth.    Kia confirms she will start auth.       Expected Discharge Plan: Skilled Nursing Facility Barriers to Discharge: English as a second language teacher, Continued Medical Work up, SNF Pending bed offer  Expected Discharge Plan and Services In-house Referral: Clinical Social Work Discharge Planning Services: CM Consult   Living arrangements for the past 2 months: Apartment Expected Discharge Date: 07/26/23                                     Social Determinants of Health (SDOH) Interventions SDOH Screenings   Food Insecurity: No Food Insecurity (07/23/2023)  Housing: Low Risk  (07/23/2023)  Transportation Needs: Unmet Transportation Needs (07/23/2023)  Utilities: Not At Risk (07/23/2023)  Financial Resource Strain: Low Risk  (09/14/2022)   Received from [facility], [facility]  Physical Activity: Insufficiently Active (08/15/2021)   [facility] from [facility], [facility]  Social Connections: Unknown (12/28/2021)   Received from [facility], [facility]  Stress: No Stress Concern Present (08/15/2021)   Received from [facility], [facility]  Tobacco Use: Low Risk  (07/23/2023)    Readmission Risk Interventions     No data to display

## 2023-07-27 NOTE — Plan of Care (Signed)

## 2023-07-27 NOTE — Progress Notes (Signed)
Physical Therapy Treatment  Patient Details Name: Veronica Baker MRN: [id number] DOB: 1950/10/29 Today's Date: 07/27/2023   History of Present Illness Pt is a 72 y.o. female who presents to [facility] for elective R THA. PMH includes HTN, HLD, chronic LE lymphedema, CVA, DVT, and L THA.    PT Comments  Pt progressing slowly towards physical therapy goals. PT received on the bed pan attempting a BM. She was unsuccessful but had voided urine in the bed pan. Pt hyperfocused on sitting on BSC to have a BM. Once transferred to the [facility] pt unwilling to get up and attempt any further mobility. Pt left on BSC with Stedy in front of her and NT aware. Will continue to follow and progress as able per POC.     If plan is discharge home, recommend the following: A lot of help with walking and/or transfers;A lot of help with bathing/dressing/bathroom;Assistance with cooking/housework;Assist for transportation;Help with stairs or ramp for entrance   Can travel by private vehicle     No  Equipment Recommendations  None recommended by PT (TBD by next venue of care)    Recommendations for Other Services       Precautions / Restrictions Precautions Precautions: Fall Restrictions Weight Bearing Restrictions: No RLE Weight Bearing: Weight bearing as tolerated     Mobility  Bed Mobility Overal bed mobility: Needs Assistance Bed Mobility: Supine to Sit     Supine to sit: +2 for physical assistance, Mod assist, Used rails, HOB elevated     General bed mobility comments: Assist for LE advancement towards EOB. Hand over hand assist to reach and grab railing on the R with LUE. Assist for trunk elevation to full sitting position. Bed pad utilized for scoot out fully to EOB.    Transfers Overall transfer level: Needs assistance Equipment used: Ambulation equipment used Transfers: Sit to/from Stand, Bed to chair/wheelchair/BSC Sit to Stand: Min assist, +2 safety/equipment, From elevated surface, Via lift  equipment           General transfer comment: VC's for hand placement on center bar of Stedy. Pt able to pull to stand with heavy mod assist. Increased time to achieve full stand. Stood x3 throughout session. Transfer via Lift Equipment: Stedy  Ambulation/Gait               General Gait Details: Unable to progress to gait training at this time.   Stairs             Wheelchair Mobility     Tilt Bed    Modified Rankin (Stroke Patients Only)       Balance Overall balance assessment: Needs assistance Sitting-balance support: Bilateral upper extremity supported, Feet supported Sitting balance-Leahy Scale: Fair Sitting balance - Comments: sitting EOB with S Postural control: Right lateral lean Standing balance support: Bilateral upper extremity supported Standing balance-Leahy Scale: Poor Standing balance comment: R lateral lean; UE support and PT support for standing in [place]; cues for hips forward and shoulders back                            Cognition Arousal: Alert Behavior During Therapy: WFL for tasks assessed/performed, Flat affect Overall Cognitive Status: No family/caregiver present to determine baseline cognitive functioning                                 General Comments: follows commands well  though slow processing        Exercises      General Comments General comments (skin integrity, edema, etc.): Pt perseverating on having a BM. Hyperfocused on sitting on BSC, unwilling to get up and attempt further mobility at this time.      Pertinent Vitals/Pain Pain Assessment Pain Assessment: Faces Faces Pain Scale: Hurts little more Pain Location: R hip and ankles (tender) Pain Descriptors / Indicators: Guarding, Discomfort, Grimacing Pain Intervention(s): Limited activity within patient's tolerance, Monitored during session, Repositioned    Home Living                          Prior Function             PT Goals (current goals can now be found in the care plan section) Acute Rehab PT Goals Patient Stated Goal: To get better, have a BM PT Goal Formulation: With patient Time For Goal Achievement: 08/06/23 Potential to Achieve Goals: Fair Progress towards PT goals: Progressing toward goals    Frequency    7X/week      PT Plan      Co-evaluation              AM-PAC PT "6 Clicks" Mobility   Outcome Measure  Help needed turning from your back to your side while in a flat bed without using bedrails?: A Lot Help needed moving from lying on your back to sitting on the side of a flat bed without using bedrails?: A Lot Help needed moving to and from a bed to a chair (including a wheelchair)?: A Lot Help needed standing up from a chair using your arms (e.g., wheelchair or bedside chair)?: A Lot Help needed to walk in hospital room?: Total Help needed climbing 3-5 steps with a railing? : Total 6 Click Score: 10    End of Session Equipment Utilized During Treatment: Gait belt Activity Tolerance: Patient tolerated treatment well Patient left: in chair;with call bell/phone within reach;with chair alarm set Nurse Communication: Need for lift equipment;Mobility status PT Visit Diagnosis: Unsteadiness on feet (R26.81);Pain;Difficulty in walking, not elsewhere classified (R26.2) Pain - Right/Left: Right Pain - part of body: Hip     Time: 1204-1227 PT Time Calculation (min) (ACUTE ONLY): 23 min  Charges:    $Gait Training: 8-22 mins $Therapeutic Activity: 8-22 mins PT General Charges $$ ACUTE PT VISIT: 1 Visit                     Conni Slipper, PT, DPT Acute Rehabilitation Services Secure Chat Preferred Office: [phone]    Veronica Baker 07/27/2023, 3:16 PM

## 2023-07-27 NOTE — Progress Notes (Signed)
Patient stable. SNF pending. No drainage in VAC canister

## 2023-07-28 DIAGNOSIS — M1611 Unilateral primary osteoarthritis, right hip: Secondary | ICD-10-CM | POA: Diagnosis not present

## 2023-07-28 NOTE — Progress Notes (Signed)
Patient stable.  No changes.  SNF pending.  Continue PT/OT

## 2023-07-28 NOTE — Progress Notes (Signed)
Physical Therapy Treatment Patient Details Name: Veronica Baker MRN: [id number] DOB: 10-03-1950 Today's Date: 07/28/2023   History of Present Illness Pt is a 72 y.o. female who presents to [facility] for elective R THA. PMH includes HTN, HLD, chronic LE lymphedema, CVA, DVT, and L THA.    PT Comments  Pt received in supine after using bed pan, pt agreeable to therapy session and NT called to room to assist with hygiene/mobility for pt safety. 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 Pt performs STS x 2 trials with up to +2 modA from slightly elevated bed<>RW and can take one sidestep toward [facility] with up to maxA for weight shifting and sliding foot to side, pain very limiting for her with step. Pt will continue to benefit from skilled rehab in a post acute setting <3 hours/day to maximize functional gains before returning home.     If plan is discharge home, recommend the following: A lot of help with walking and/or transfers;A lot of help with bathing/dressing/bathroom;Assistance with cooking/housework;Assist for transportation;Help with stairs or ramp for entrance   Can travel by private vehicle     No  Equipment Recommendations  None recommended by PT (TBD by next venue of care)    Recommendations for Other Services       Precautions / Restrictions Precautions Precautions: Fall Precaution Comments: wound vac Restrictions Weight Bearing Restrictions: Yes RLE Weight Bearing: Weight bearing as tolerated     Mobility  Bed Mobility Overal bed mobility: Needs Assistance Bed Mobility: Rolling, Sidelying to Sit, Sit to Sidelying Rolling: Max assist, Used rails Sidelying to sit: Max assist, Used rails     Sit to sidelying: Mod assist, +2 for physical assistance, Used rails General bed mobility comments: MaxA to roll to her L (pt has chronic R  HB weakness due to CVA so can't assist as much to roll to her L) Hand over hand assist to reach and grab railing on the L with RUE and to advance her legs due to residual LE weakness from CVA and pt guarding due to pain. Assist for trunk elevation to full sitting position from flat bed with maxA, pt assisting as able. Bed pad utilized for scoot out fully to EOB and pt able to weight shifting to assist with scoots.    Transfers Overall transfer level: Needs assistance Equipment used: Rolling walker (2 wheels) Transfers: Sit to/from Stand, Bed to chair/wheelchair/BSC Sit to Stand: Mod assist, +2 physical assistance Stand pivot transfers: Max assist, +2 physical assistance         General transfer comment: from EOB<>RW with +2 modA, pt keeping both UE on RW due to R hx CVA weakness and pain, difficulty weight shifting onto RLE for stepping to her L toward [facility] so performed partial stand pivot toward [facility] (pt defers OOB to chair due to increased pain/tearful after STS x 2 reps.    Ambulation/Gait               General Gait Details: Unable to progress to gait training at this time but able to take 1 step laterally toward [facility] wtih each leg, maxA to weight shift and slide LLE toward HOB due to RLE pain.   Stairs             Wheelchair Mobility     Tilt Bed  Modified Rankin (Stroke Patients Only)       Balance Overall balance assessment: Needs assistance Sitting-balance support: Bilateral upper extremity supported, Feet supported Sitting balance-Leahy Scale: Fair Sitting balance - Comments: sitting EOB with Supv to CGA   Standing balance support: Bilateral upper extremity supported, Reliant on assistive device for balance Standing balance-Leahy Scale: Poor Standing balance comment: at RW, poor tolerance for dynamic standing tasks due to RLE pain                            Cognition Arousal: Alert Behavior During Therapy: WFL for tasks assessed/performed,  Flat affect Overall Cognitive Status: No family/caregiver present to determine baseline cognitive functioning                                 General Comments: Follows commands well, though slow processing. Some anxiety in anticipation of pain, cues for pursed-lip breathing given.        Exercises Total Joint Exercises Ankle Circles/Pumps: AROM, Both, Supine, 5 reps Quad Sets: AROM, Both, 5 reps, Supine Heel Slides: AAROM, Both, 5 reps, Supine Hip ABduction/ADduction: AAROM, Both, 5 reps, Supine Long Arc Quad: AROM, Both, 10 reps, Seated Marching in Standing: AROM, Right, Standing (only tolerates 1 rep) Other Exercises Other Exercises: Positioned RLE to promote knee extension.    General Comments General comments (skin integrity, edema, etc.): pt c/o pain in peri area/chafing with hygiene assist, NT notified and to bring her barrier ointment; RN notified pt with dark colored stools after using bed pan. Pt able to dry her own peri area with washcloth with HOB elevated partially when prompted Pt with bil knee flexion contracture, R>L limiting knee extension ROM while sitting EOB.     Pertinent Vitals/Pain Pain Assessment Pain Assessment: 0-10 Pain Score: 8  Faces Pain Scale: Hurts whole lot Pain Location: R hip and ankles (tender) with standing trial and attempt to take a step; 5/10 during supine therex Pain Descriptors / Indicators: Guarding, Discomfort, Grimacing, Moaning, Crying    Home Living                          Prior Function            PT Goals (current goals can now be found in the care plan section) Acute Rehab PT Goals Patient Stated Goal: To get better, less pain PT Goal Formulation: With patient Time For Goal Achievement: 08/06/23 Progress towards PT goals: Progressing toward goals    Frequency    7X/week      PT Plan         AM-PAC PT "6 Clicks" Mobility   Outcome Measure  Help needed turning from your back to your  side while in a flat bed without using bedrails?: Total (w/o rails) Help needed moving from lying on your back to sitting on the side of a flat bed without using bedrails?: Total (w/o rails) Help needed moving to and from a bed to a chair (including a wheelchair)?: A Lot Help needed standing up from a chair using your arms (e.g., wheelchair or bedside chair)?: A Lot Help needed to walk in hospital room?: Total Help needed climbing 3-5 steps with a railing? : Total 6 Click Score: 8    End of Session Equipment Utilized During Treatment: Gait belt Activity Tolerance: Patient tolerated treatment well Patient left: with call  bell/phone within reach;in bed;with bed alarm set;Other (comment) (pt heels floated; NT in room to place new purewick) Nurse Communication: Mobility status;Patient requests pain meds PT Visit Diagnosis: Unsteadiness on feet (R26.81);Pain;Difficulty in walking, not elsewhere classified (R26.2) Pain - Right/Left: Right Pain - part of body: Hip     Time: 6440-3474 PT Time Calculation (min) (ACUTE ONLY): 30 min  Charges:    $Therapeutic Exercise: 8-22 mins $Therapeutic Activity: 8-22 mins PT General Charges $$ ACUTE PT VISIT: 1 Visit                     Jleigh Striplin P., PTA Acute Rehabilitation Services Secure Chat Preferred 9a-5:30pm Office: [phone]    Dorathy Kinsman [facility] 07/28/2023, 2:22 PM

## 2023-07-29 DIAGNOSIS — M1611 Unilateral primary osteoarthritis, right hip: Secondary | ICD-10-CM | POA: Diagnosis not present

## 2023-07-29 NOTE — Plan of Care (Signed)
  Problem: Health Behavior/Discharge Planning: Goal: Ability to manage health-related needs will improve Outcome: Progressing   Problem: Activity: Goal: Risk for activity intolerance will decrease Outcome: Progressing   Problem: Nutrition: Goal: Adequate nutrition will be maintained Outcome: Progressing   Problem: Coping: Goal: Level of anxiety will decrease Outcome: Progressing   Problem: Elimination: Goal: Will not experience complications related to bowel motility Outcome: Progressing   Problem: Pain Management: Goal: General experience of comfort will improve Outcome: Progressing

## 2023-07-30 DIAGNOSIS — M1611 Unilateral primary osteoarthritis, right hip: Secondary | ICD-10-CM | POA: Diagnosis not present

## 2023-07-30 MED ORDER — ENSURE ENLIVE PO LIQD
237.0000 mL | Freq: Two times a day (BID) | ORAL | Status: DC
  Administered 2023-07-30 – 2023-08-01 (×5): 237 mL via ORAL

## 2023-07-30 MED ORDER — ORAL CARE MOUTH RINSE: 15.0000 mL | OROMUCOSAL | Status: DC | PRN

## 2023-07-30 NOTE — Progress Notes (Signed)
PT Cancellation Note  Patient Details Name: Veronica Baker MRN: [id number] DOB: 09/09/1950   Cancelled Treatment:    Reason Eval/Treat Not Completed: (P) Fatigue/lethargy limiting ability to participate (pt received in supine, lethargic and with lunch tray in front of her but not yet touched. Pt awoken with encouragement and set up to eat with bed placed in more upright chair posture, pt self-feeding at end of session, call bell in reach.) Will continue efforts later in day per PT plan of care as schedule permits if pt more alert and able to participate once done eating. 1433 IN -1437 OUT  Angus Palms 07/30/2023, 4:07 PM

## 2023-07-30 NOTE — TOC Progression Note (Addendum)
Transition of Care [facility]) - Progression Note    Patient Details  Name: Veronica Baker MRN: [id number] Date of Birth: Mar 01, 1951  Transition of Care [facility]) CM/SW Contact  Erin Sons, [place] Phone Number: 07/30/2023, 9:58 AM  Clinical Narrative:     CSW contacted [facility] admissions and is informed insurance Berkley Harvey is still pending. TOC will continue to follow.   1350: Auth still pending at this time. TOC will continue to follow.  PASRR: [id number] E expires 08/25/23  Expected Discharge Plan: Skilled Nursing Facility Barriers to Discharge: Insurance Authorization  Expected Discharge Plan and Services In-house Referral: Clinical Social Work Discharge Planning Services: CM Consult   Living arrangements for the past 2 months: Apartment Expected Discharge Date: 07/26/23                                     Social Determinants of Health (SDOH) Interventions SDOH Screenings   Food Insecurity: No Food Insecurity (07/23/2023)  Housing: Low Risk  (07/23/2023)  Transportation Needs: Unmet Transportation Needs (07/23/2023)  Utilities: Not At Risk (07/23/2023)  Financial Resource Strain: Low Risk  (09/14/2022)   Received from [facility], [facility]  Physical Activity: Insufficiently Active (08/15/2021)   Received from [facility], [facility]  Social Connections: Unknown (12/28/2021)   Received from [facility], [facility]  Stress: No Stress Concern Present (08/15/2021)   Received from [facility], [facility]  Tobacco Use: Low Risk  (07/23/2023)    Readmission Risk Interventions     No data to display

## 2023-07-30 NOTE — Plan of Care (Signed)
  Problem: Clinical Measurements: Goal: Will remain free from infection Outcome: Progressing Goal: Cardiovascular complication will be avoided Outcome: Progressing   Problem: Activity: Goal: Risk for activity intolerance will decrease Outcome: Progressing   Problem: Nutrition: Goal: Adequate nutrition will be maintained Outcome: Progressing   Problem: Coping: Goal: Level of anxiety will decrease Outcome: Progressing   Problem: Elimination: Goal: Will not experience complications related to bowel motility Outcome: Progressing Goal: Will not experience complications related to urinary retention Outcome: Progressing

## 2023-07-31 DIAGNOSIS — M1611 Unilateral primary osteoarthritis, right hip: Secondary | ICD-10-CM | POA: Diagnosis not present

## 2023-07-31 NOTE — TOC Progression Note (Addendum)
Transition of Care [facility]) - Progression Note    Patient Details  Name: Veronica Baker MRN: [id number] Date of Birth: November 18, 1950  [facility]) CM/SW Contact  Donnalee Curry, LCSWA Phone Number: 07/31/2023, 11:06 AM  Clinical Narrative:     SW spoke with [facility] [phone]) will check on auth.  Update 204pm SW informed by [facility]) auth approved. Kia inquired about wound vac and if pt will need at d/c. Per notes from 11/24, VAC in place for 1 week but no end date noted. Secure chat sent to bedside RN to determine if end date is known. If pt does not need vac at d/c can d/c tomorrow otherwise no wound care nurses available on weekend to assist with wound vac hook up.   Expected Discharge Plan: Skilled Nursing Facility Barriers to Discharge: Insurance Authorization  Expected Discharge Plan and Services In-house Referral: Clinical Social Work Discharge Planning Services: CM Consult   Living arrangements for the past 2 months: Apartment Expected Discharge Date: 07/26/23                                     Social Determinants of Health (SDOH) Interventions SDOH Screenings   Food Insecurity: No Food Insecurity (07/23/2023)  Housing: Low Risk  (07/23/2023)  Transportation Needs: Unmet Transportation Needs (07/23/2023)  Utilities: Not At Risk (07/23/2023)  Financial Resource Strain: Low Risk  (09/14/2022)   Received from [facility], [facility]  Physical Activity: Insufficiently Active (08/15/2021)   Received from [facility], [facility]  Social Connections: Unknown (12/28/2021)   Received from [facility], [facility]  Stress: No Stress Concern Present (08/15/2021)   Received from [facility], [facility]  Tobacco Use: Low Risk  (07/23/2023)    Readmission Risk Interventions     No data to display

## 2023-07-31 NOTE — Progress Notes (Addendum)
Physical Therapy Treatment Patient Details Name: Veronica Baker MRN: [id number] DOB: 1951/07/06 Today's Date: 07/31/2023   History of Present Illness Pt is a 72 y.o. female who presents to [facility] for elective R THA. PMH includes HTN, HLD, chronic LE lymphedema, CVA, DVT, and L THA.    PT Comments  Pt received in recliner, RN requesting PTA assist for pt safety to transfer from chair>bed, pt in more pain this afternoon, RN notified pt requesting ice pack/pain meds post-transfer. Pt needing up to +2 modA for transfer from lower chair height>Stedy and used Stedy left for pivot instead of RW as pt tearful and guarding RLE with severe c/o pain and unable to safely take steps at time of session. Pt performed bed mobility with +2 maxA due to guarding/pain. Pt positioned for comfort/pressure relief. Pt will continue to benefit from skilled rehab in a post acute setting < 3 hours/day to maximize functional gains before returning home.     If plan is discharge home, recommend the following: A lot of help with walking and/or transfers;A lot of help with bathing/dressing/bathroom;Assistance with cooking/housework;Assist for transportation;Help with stairs or ramp for entrance   Can travel by private vehicle     No  Equipment Recommendations  None recommended by PT (TBD post-acute)    Recommendations for Other Services       Precautions / Restrictions Precautions Precautions: Fall Precaution Comments: Wound vac Restrictions Weight Bearing Restrictions: Yes RLE Weight Bearing: Weight bearing as tolerated     Mobility  Bed Mobility Overal bed mobility: Needs Assistance   Sit to supine: Max assist, +2 for physical assistance   General bed mobility comments: Increased assist returning to supine as pt tearful and guarding due to pain and unable to assist as well with BLE elevation, needs trunk and BLE assist.    Transfers Overall transfer level: Needs assistance Transfers: Sit to/from Stand,  Bed to chair/wheelchair/BSC Sit to Stand: Mod assist, +2 physical assistance, Via lift equipment   General transfer comment: modA from lower chair surface>Stedy and minA from elevated Stedy flaps to stand and for stand>sit to EOB. Defer pivotal transfer as pt c/o severe RLE pain after sitting up in chair for a couple hours and tearful, unable to tolerate weight shifting/stepping for return to bed. Transfer via Lift Equipment: Stedy  Ambulation/Gait     General Gait Details: defer, pt in too much pain after sitting up in chair     Balance Overall balance assessment: Needs assistance Sitting-balance support: Bilateral upper extremity supported, Feet supported Sitting balance-Leahy Scale: Fair Sitting balance - Comments: sitting EOB   Standing balance support: Bilateral upper extremity supported, During functional activity, Reliant on assistive device for balance Standing balance-Leahy Scale: Poor Standing balance comment: Stedy in PM                            Cognition Arousal: Alert Behavior During Therapy: WFL for tasks assessed/performed Overall Cognitive Status: No family/caregiver present to determine baseline cognitive functioning                                 General Comments: follows simple commands well, slow processing, difficult to understand pt speech 2/2 baseline dysarthria/low volume        Exercises Total Joint Exercises Ankle Circles/Pumps: AROM, Both, Supine, 5 reps, AAROM Hip ABduction/ADduction: AAROM, Both, 5 reps, Supine, Other (comment) (limited ROM each  side, pt guarding due to pain) Straight Leg Raises: AAROM, Both, 5 reps, Supine Long Arc Quad:  (too much pain to perform more than 1-2 ea) Other Exercises Other Exercises: Encouraged IS use hourly    General Comments General comments (skin integrity, edema, etc.): RLE edema/guarding, yellow bone foam placed under BLE and pt encouraged to work on extending BLE, folded blanket  also between her knees as pt adducting legs tightly together, pt instructed on neutral posture in supine and use of ice, RN to get ice pack for her RLE/hip for pain control in addition to pain meds.      Pertinent Vitals/Pain Pain Assessment Pain Assessment: Faces Faces Pain Scale: Hurts whole lot Pain Location: R thigh/hip Pain Descriptors / Indicators: Aching, Discomfort, Guarding, Grimacing, Crying Pain Intervention(s): Monitored during session, Limited activity within patient's tolerance, Repositioned, Patient requesting pain meds-RN notified (PTA requesting RN to get her ice pack at end of session)     PT Goals (current goals can now be found in the care plan section) Acute Rehab PT Goals Patient Stated Goal: To get better, less pain PT Goal Formulation: With patient Time For Goal Achievement: 08/06/23 Progress towards PT goals: Progressing toward goals    Frequency    7X/week      PT Plan         AM-PAC PT "6 Clicks" Mobility   Outcome Measure  Help needed turning from your back to your side while in a flat bed without using bedrails?: A Lot Help needed moving from lying on your back to sitting on the side of a flat bed without using bedrails?: A Lot Help needed moving to and from a bed to a chair (including a wheelchair)?: A Lot Help needed standing up from a chair using your arms (e.g., wheelchair or bedside chair)?: A Lot Help needed to walk in hospital room?: Total Help needed climbing 3-5 steps with a railing? : Total 6 Click Score: 10    End of Session Equipment Utilized During Treatment: Gait belt Activity Tolerance: Patient limited by pain;Patient limited by fatigue Patient left: with call bell/phone within reach;in bed;with bed alarm set;with nursing/sitter in room;Other (comment) (heels floated, TED hose in place, left SCD off at time of session due to pt c/o BLE pain/wanting pain meds first) Nurse Communication: Mobility status;Need for lift  equipment;Precautions;Patient requests pain meds;Other (comment) (needs ice for R hip) PT Visit Diagnosis: Unsteadiness on feet (R26.81);Pain;Difficulty in walking, not elsewhere classified (R26.2) Pain - Right/Left: Right Pain - part of body: Hip     Time: 6440-3474 PT Time Calculation (min) (ACUTE ONLY): 8 min  Charges:    $Therapeutic Activity: 8-22 mins PT General Charges $$ ACUTE PT VISIT: 1 Visit                     Abbas Beyene P., PTA Acute Rehabilitation Services Secure Chat Preferred 9a-5:30pm Office: [phone]    Dorathy Kinsman [facility] 07/31/2023, 6:38 PM

## 2023-07-31 NOTE — Evaluation (Signed)
Occupational Therapy Evaluation Patient Details Name: Veronica Baker MRN: [id number] DOB: 1950-10-29 Today's Date: 07/31/2023   History of Present Illness Pt is a 72 y.o. female who presents to [facility] for elective R THA. PMH includes HTN, HLD, chronic LE lymphedema, CVA, DVT, and L THA.   Clinical Impression   Pt admitted for above, unsure of baseline cognition but pt follows commands well. Pt demonstrating impaired balance and pain s/p R THA, needing min A +2 to mod A +2 for  OOB mobility and Total to setup assist for ADLs (more assist needed with LB). Pt able to complete pivots with +2 assist at this time, more assist needed upon fatigue. Pt would benefit from continued acute skilled OT services to address deficits and help transition to next level of care.       If plan is discharge home, recommend the following: Two people to help with walking and/or transfers;A lot of help with walking and/or transfers;A lot of help with bathing/dressing/bathroom;Assistance with cooking/housework    Functional Status Assessment  Patient has had a recent decline in their functional status and demonstrates the ability to make significant improvements in function in a reasonable and predictable amount of time.  Equipment Recommendations  None recommended by OT (TBD at next level of care)    Recommendations for Other Services       Precautions / Restrictions Precautions Precautions: Fall Precaution Comments: wound vac Restrictions Weight Bearing Restrictions: Yes RLE Weight Bearing: Weight bearing as tolerated      Mobility Bed Mobility Overal bed mobility: Needs Assistance Bed Mobility: Rolling, Sidelying to Sit Rolling: Mod assist Sidelying to sit: Mod assist, HOB elevated, Used rails       General bed mobility comments: use of bed pads to assist pt with rolling and scooting anteriorly. Mod A needed for trunk elevation to position into sitting. Pt left in recliner    Transfers Overall  transfer level: Needs assistance Equipment used: Rolling walker (2 wheels) Transfers: Sit to/from Stand, Bed to chair/wheelchair/BSC Sit to Stand: Min assist Stand pivot transfers: Min assist, +2 physical assistance         General transfer comment: Mod A +2 pivot from [facility] to recliner, physical assist needed for LLE mobility, cues for hand placement      Balance Overall balance assessment: Needs assistance Sitting-balance support: Bilateral upper extremity supported, Feet supported Sitting balance-Leahy Scale: Fair Sitting balance - Comments: sitting EOB   Standing balance support: Bilateral upper extremity supported, During functional activity, Reliant on assistive device for balance Standing balance-Leahy Scale: Poor                             ADL either performed or assessed with clinical judgement   ADL Overall ADL's : Needs assistance/impaired Eating/Feeding: Independent;Sitting   Grooming: Set up;Sitting   Upper Body Bathing: Sitting;Set up;Supervision/ safety   Lower Body Bathing: Moderate assistance;Sitting/lateral leans   Upper Body Dressing : Sitting;Contact guard assist   Lower Body Dressing: Total assistance;Bed level Lower Body Dressing Details (indicate cue type and reason): don socks and ted hoses Toilet Transfer: +2 for physical assistance;+2 for safety/equipment;Minimal assistance;Rolling walker (2 wheels) Toilet Transfer Details (indicate cue type and reason): cues for bigger strides/steps with BLEs Toileting- Clothing Manipulation and Hygiene: Maximal assistance;Sit to/from stand Toileting - Clothing Manipulation Details (indicate cue type and reason): Pt attempting to self wipe, not able to reach and cues for balance needed.  Functional mobility during ADLs: Minimal assistance;+2 for physical assistance;+2 for safety/equipment;Moderate assistance;Rolling walker (2 wheels) (progressed to mod A +2 with onset of fatigue)       Vision          Perception         Praxis         Pertinent Vitals/Pain Pain Assessment Pain Assessment: Faces Faces Pain Scale: Hurts little more Pain Location: R thigh/hip Pain Descriptors / Indicators: Aching, Discomfort, Guarding Pain Intervention(s): Repositioned, Monitored during session, Limited activity within patient's tolerance     Extremity/Trunk Assessment Upper Extremity Assessment Upper Extremity Assessment: Generalized weakness (bilat shoulder flexion limited to ~100* AROM. Osteoarthritis, deferred MMT)   Lower Extremity Assessment Lower Extremity Assessment: Generalized weakness (Pt presented with weakness and decreased knee extension in BLE.)       Communication Communication Communication: Difficulty communicating thoughts/reduced clarity of speech (low volume and fast mumbles, able to improve with cues) Cueing Techniques: Verbal cues;Gestural cues   Cognition Arousal: Alert Behavior During Therapy: WFL for tasks assessed/performed Overall Cognitive Status: No family/caregiver present to determine baseline cognitive functioning                                 General Comments: follows simple commands well     General Comments  Pt with c/o bottom pain during wiping, barrier cream applied.    Exercises     Shoulder Instructions      Home Living Family/patient expects to be discharged to:: Other (Comment) (ILF) Living Arrangements: Alone                                      Prior Functioning/Environment Prior Level of Function : Needs assist       Physical Assist : Mobility (physical);ADLs (physical) Mobility (physical): Gait   Mobility Comments: Pt utilized WC and scooter for mobility. Pt reported ability to stand pivot alone. Pt has not ambulated in a year ADLs Comments: Pt receives help with cooking, cleaning, and bathing.        OT Problem List: Impaired balance (sitting and/or standing);Decreased  strength;Pain;Decreased knowledge of precautions      OT Treatment/Interventions: Self-care/ADL training;Therapeutic exercise;Balance training;Therapeutic activities;Patient/family education;DME and/or AE instruction    OT Goals(Current goals can be found in the care plan section) Acute Rehab OT Goals Patient Stated Goal: To get to chair OT Goal Formulation: With patient Time For Goal Achievement: 08/14/23 Potential to Achieve Goals: Good ADL Goals Pt Will Perform Grooming: standing;with contact guard assist Pt Will Perform Lower Body Bathing: sitting/lateral leans;with contact guard assist Pt Will Perform Lower Body Dressing: sit to/from stand;with mod assist Pt Will Transfer to Toilet: ambulating;with min assist Pt Will Perform Toileting - Clothing Manipulation and hygiene: with contact guard assist;sitting/lateral leans  OT Frequency: Min 1X/week    Co-evaluation              AM-PAC OT "6 Clicks" Daily Activity     Outcome Measure Help from another person eating meals?: None Help from another person taking care of personal grooming?: A Little Help from another person toileting, which includes using toliet, bedpan, or urinal?: A Lot Help from another person bathing (including washing, rinsing, drying)?: A Lot Help from another person to put on and taking off regular upper body clothing?: A Little Help from another person to put on  and taking off regular lower body clothing?: Total 6 Click Score: 15   End of Session Equipment Utilized During Treatment: Gait belt;Rolling walker (2 wheels) Nurse Communication: Mobility status (+2 pivots)  Activity Tolerance: Patient tolerated treatment well Patient left: in chair;with call bell/phone within reach;with chair alarm set  OT Visit Diagnosis: Unsteadiness on feet (R26.81);Other abnormalities of gait and mobility (R26.89);Pain Pain - Right/Left: Right Pain - part of body: Hip                Time: 1610-9604 OT Time Calculation  (min): 36 min Charges:  OT General Charges $OT Visit: 1 Visit OT Evaluation $OT Eval Moderate Complexity: 1 Mod  07/31/2023  AB, OTR/L  Acute Rehabilitation Services  Office: [phone]   Tristan Schroeder 07/31/2023, 4:14 PM

## 2023-07-31 NOTE — Progress Notes (Signed)
Physical Therapy Treatment Patient Details Name: Veronica Baker MRN: [id number] DOB: 1951/01/13 Today's Date: 07/31/2023   History of Present Illness Pt is a 72 y.o. female who presents to [facility] for elective R THA. PMH includes HTN, HLD, chronic LE lymphedema, CVA, DVT, and L THA.    PT Comments  Pt received in supine, agreeable to therapy session and more alert this date, following simple motor commands well, pt at times difficult to understand due to her baseline dysarthria. Pt needing up to +2 modA to perform bed mobility and transfers/gait at bedside with RW support. Pt needing multimodal cues and increased time to perform and process cues, and to use BSC due to need for BM. Pt with improved pain and activity tolerance this date and agreeable to sit up in chair at end of session. Pt reports moderate to severe fatigue 7/10 modified RPE at end of session. Pt will continue to benefit from skilled rehab in a post acute setting <3 hours/day to maximize functional gains before returning home.    If plan is discharge home, recommend the following: A lot of help with walking and/or transfers;A lot of help with bathing/dressing/bathroom;Assistance with cooking/housework;Assist for transportation;Help with stairs or ramp for entrance   Can travel by private vehicle     No  Equipment Recommendations  None recommended by PT (TBD post-acute)    Recommendations for Other Services       Precautions / Restrictions Precautions Precautions: Fall Precaution Comments: Wound vac Restrictions Weight Bearing Restrictions: Yes RLE Weight Bearing: Weight bearing as tolerated     Mobility  Bed Mobility Overal bed mobility: Needs Assistance Bed Mobility: Rolling, Sidelying to Sit Rolling: Mod assist Sidelying to sit: Mod assist, HOB elevated, Used rails       General bed mobility comments: Use of bed pads to assist pt with rolling and scooting anteriorly. Mod A needed for trunk elevation to position  into sitting. Pt left in recliner at end of session.    Transfers Overall transfer level: Needs assistance Equipment used: Rolling walker (2 wheels) Transfers: Sit to/from Stand, Bed to chair/wheelchair/BSC Sit to Stand: Min assist, From elevated surface, +2 safety/equipment   Step pivot transfers: Min assist, +2 physical assistance, From elevated surface       General transfer comment: minA for bed>BSC pivot on her L side . Mod A +2 to pivot from [facility] to recliner, physical assist needed for LLE placement and hip flexion/ abduction AAROM, cues for hand placement prior to sitting. Audible joint crepitus in multiple joints during transfers.    Ambulation/Gait Ambulation/Gait assistance: Mod assist, +2 physical assistance, +2 safety/equipment Gait Distance (Feet): 6 Feet Assistive device: Rolling walker (2 wheels) Gait Pattern/deviations: Step-to pattern, Decreased step length - left, Decreased dorsiflexion - left, Decreased weight shift to left, Knee flexed in stance - right, Knee flexed in stance - left, Shuffle, Trunk flexed, Narrow base of support       General Gait Details: Manual assist for LLE placement, pt unable to safely place LLE without AAROM and manual assist to weight shift toward each side wtih stepping to allow for offloading  each leg. Difficulty accepting weight through RLE due to increased pain for LLE stepping. Manual assist to move RW; pt would benefit from youth height RW brought to room for next session.   Stairs             Wheelchair Mobility     Tilt Bed    Modified Rankin (Stroke Patients Only)  Balance Overall balance assessment: Needs assistance Sitting-balance support: Bilateral upper extremity supported, Feet supported Sitting balance-Leahy Scale: Fair Sitting balance - Comments: sitting EOB   Standing balance support: Bilateral upper extremity supported, During functional activity, Reliant on assistive device for balance Standing  balance-Leahy Scale: Poor Standing balance comment: RW                            Cognition Arousal: Alert Behavior During Therapy: WFL for tasks assessed/performed Overall Cognitive Status: No family/caregiver present to determine baseline cognitive functioning                                 General Comments: follows simple commands well, slow processing, difficult to understand pt speech 2/2 baseline dysarthria/low volume        Exercises Other Exercises Other Exercises: reviewed supine/seated RLE AROM, pt reports compliance but pt not a reliable reporter, unless family was helping her.    General Comments General comments (skin integrity, edema, etc.): Pt with c/o bottom pain during wiping, barrier cream applied and new purewick placed; pt needs assist for peri-care completion after BM on BSC.      Pertinent Vitals/Pain Pain Assessment Pain Assessment: Faces Faces Pain Scale: Hurts little more Pain Location: R thigh/hip Pain Descriptors / Indicators: Aching, Discomfort, Guarding Pain Intervention(s): Limited activity within patient's tolerance, Monitored during session, Repositioned    Home Living Family/patient expects to be discharged to:: Other (Comment) (ILF) Living Arrangements: Alone                      Prior Function            PT Goals (current goals can now be found in the care plan section) Acute Rehab PT Goals Patient Stated Goal: To get better, less pain PT Goal Formulation: With patient Time For Goal Achievement: 08/06/23 Progress towards PT goals: Progressing toward goals    Frequency    7X/week      PT Plan      Co-evaluation PT/OT/SLP Co-Evaluation/Treatment: Yes Reason for Co-Treatment: Necessary to address cognition/behavior during functional activity;For patient/therapist safety;To address functional/ADL transfers PT goals addressed during session: Mobility/safety with mobility;Balance;Proper use of  DME        AM-PAC PT "6 Clicks" Mobility   Outcome Measure  Help needed turning from your back to your side while in a flat bed without using bedrails?: A Lot Help needed moving from lying on your back to sitting on the side of a flat bed without using bedrails?: A Lot Help needed moving to and from a bed to a chair (including a wheelchair)?: A Lot Help needed standing up from a chair using your arms (e.g., wheelchair or bedside chair)?: A Lot Help needed to walk in hospital room?: Total Help needed climbing 3-5 steps with a railing? : Total 6 Click Score: 10    End of Session Equipment Utilized During Treatment: Gait belt Activity Tolerance: Patient tolerated treatment well Patient left: in chair;with call bell/phone within reach;with chair alarm set Nurse Communication: Mobility status;Need for lift equipment;Precautions PT Visit Diagnosis: Unsteadiness on feet (R26.81);Pain;Difficulty in walking, not elsewhere classified (R26.2) Pain - Right/Left: Right Pain - part of body: Hip     Time: 3329-5188 PT Time Calculation (min) (ACUTE ONLY): 36 min  Charges:    $Therapeutic Activity: 8-22 mins PT General Charges $$ ACUTE PT VISIT: 1  Visit                     Florina Ou., PTA Acute Rehabilitation Services Secure Chat Preferred 9a-5:30pm Office: [phone]    Dorathy Kinsman [facility] 07/31/2023, 5:59 PM

## 2023-08-01 DIAGNOSIS — M1611 Unilateral primary osteoarthritis, right hip: Secondary | ICD-10-CM | POA: Diagnosis not present

## 2023-08-01 NOTE — Progress Notes (Signed)
 Patient stable.  No changes.  SNF pending.  Continue PT/OT

## 2023-08-01 NOTE — Plan of Care (Signed)
  Problem: Education: Goal: Knowledge of General Education information will improve Description: Including pain rating scale, medication(s)/side effects and non-pharmacologic comfort measures Outcome: Progressing   Problem: Health Behavior/Discharge Planning: Goal: Ability to manage health-related needs will improve Outcome: Progressing   Problem: Clinical Measurements: Goal: Ability to maintain clinical measurements within normal limits will improve Outcome: Progressing Goal: Will remain free from infection Outcome: Progressing Goal: Diagnostic test results will improve Outcome: Progressing Goal: Cardiovascular complication will be avoided Outcome: Progressing   Problem: Activity: Goal: Risk for activity intolerance will decrease Outcome: Progressing   Problem: Nutrition: Goal: Adequate nutrition will be maintained Outcome: Progressing   Problem: Coping: Goal: Level of anxiety will decrease Outcome: Progressing   Problem: Elimination: Goal: Will not experience complications related to bowel motility Outcome: Progressing Goal: Will not experience complications related to urinary retention Outcome: Progressing   Problem: Pain Management: Goal: General experience of comfort will improve Outcome: Progressing   Problem: Safety: Goal: Ability to remain free from injury will improve Outcome: Progressing   Problem: Skin Integrity: Goal: Risk for impaired skin integrity will decrease Outcome: Progressing   Problem: Education: Goal: Knowledge of the prescribed therapeutic regimen will improve Outcome: Progressing Goal: Understanding of discharge needs will improve Outcome: Progressing Goal: Individualized Educational Video(s) Outcome: Progressing   Problem: Activity: Goal: Ability to avoid complications of mobility impairment will improve Outcome: Progressing Goal: Ability to tolerate increased activity will improve Outcome: Progressing   Problem: Clinical  Measurements: Goal: Postoperative complications will be avoided or minimized Outcome: Progressing   Problem: Pain Management: Goal: Pain level will decrease with appropriate interventions Outcome: Progressing   Problem: Skin Integrity: Goal: Will show signs of wound healing Outcome: Progressing

## 2023-08-01 NOTE — Progress Notes (Addendum)
Physical Therapy Treatment Patient Details Name: Veronica Baker MRN: [id number] DOB: October 23, 1950 Today's Date: 08/01/2023   History of Present Illness Pt is a 72 y.o. female who presents to [facility] for elective R THA. PMH includes HTN, HLD, chronic LE lymphedema, CVA, DVT, and L THA.    PT Comments  Pt in recliner upon arrival and agreeable to PT session. Pt wanted to get to the [facility] so worked on transfers and LE strength in today's session. Pt required ModA to stand and MaxA for stand pivot. Did not progress ambulation today due to not having +2 available. Pt needed assist for pericare in standing and to don briefs. Pt was able to perform minimal LE exercises while reclined in the chair. Pt is progressing slowly towards goals. Acute PT to follow.      If plan is discharge home, recommend the following: A lot of help with walking and/or transfers;A lot of help with bathing/dressing/bathroom;Assistance with cooking/housework;Assist for transportation;Help with stairs or ramp for entrance   Can travel by private vehicle     No  Equipment Recommendations  None recommended by PT (TBD post-acute)       Precautions / Restrictions Precautions Precautions: Fall Precaution Comments: Wound vac Restrictions Weight Bearing Restrictions: Yes RLE Weight Bearing: Weight bearing as tolerated     Mobility  Bed Mobility Overal bed mobility: Needs Assistance Bed Mobility: Supine to Sit     Supine to sit: Mod assist, HOB elevated     General bed mobility comments: In recliner, returned to recliner    Transfers Overall transfer level: Needs assistance Equipment used: Rolling walker (2 wheels) Transfers: Sit to/from Stand, Bed to chair/wheelchair/BSC Sit to Stand: Mod assist Stand pivot transfers: Max assist Step pivot transfers: Min assist, +2 physical assistance       General transfer comment: pt needed ModA to stand for boost up and initial rise. MaxA to complete stand pivot. Did not  progress gait with only +1 available           Balance Overall balance assessment: Needs assistance Sitting-balance support: Bilateral upper extremity supported, Feet supported Sitting balance-Leahy Scale: Fair Sitting balance - Comments: sitting EOB   Standing balance support: Bilateral upper extremity supported, During functional activity, Reliant on assistive device for balance Standing balance-Leahy Scale: Poor Standing balance comment: reliant on RW       Cognition Arousal: Alert Behavior During Therapy: WFL for tasks assessed/performed Overall Cognitive Status: No family/caregiver present to determine baseline cognitive functioning    General Comments: follows simple commands well with slow processing        Exercises Total Joint Exercises Ankle Circles/Pumps: AROM, Both, 10 reps, Supine Quad Sets: AROM, Both, 10 reps, Supine Hip ABduction/ADduction: Both, Other (comment), AROM, Seated, 10 reps (limited ROM each side, pt guarding due to pain) Long Arc Quad: AROM, Both, 10 reps, Seated Other Exercises Other Exercises: toe raises B x10 in seated    General Comments General comments (skin integrity, edema, etc.): R LE wound vac in tact      Pertinent Vitals/Pain Pain Assessment Pain Assessment: Faces Faces Pain Scale: Hurts little more Pain Location: R thigh/hip Pain Descriptors / Indicators: Aching, Discomfort, Guarding, Grimacing, Crying Pain Intervention(s): Limited activity within patient's tolerance, Monitored during session, Repositioned           PT Goals (current goals can now be found in the care plan section) Acute Rehab PT Goals Patient Stated Goal: To get better, less pain PT Goal Formulation: With patient Time  For Goal Achievement: 08/06/23 Potential to Achieve Goals: Fair Progress towards PT goals: Progressing toward goals    Frequency    7X/week       AM-PAC PT "6 Clicks" Mobility   Outcome Measure  Help needed turning from your  back to your side while in a flat bed without using bedrails?: A Lot Help needed moving from lying on your back to sitting on the side of a flat bed without using bedrails?: A Lot Help needed moving to and from a bed to a chair (including a wheelchair)?: A Lot Help needed standing up from a chair using your arms (e.g., wheelchair or bedside chair)?: A Lot Help needed to walk in hospital room?: Total Help needed climbing 3-5 steps with a railing? : Total 6 Click Score: 10    End of Session Equipment Utilized During Treatment: Gait belt Activity Tolerance: Patient tolerated treatment well Patient left: in chair;with chair alarm set;with call bell/phone within reach Nurse Communication: Mobility status PT Visit Diagnosis: Unsteadiness on feet (R26.81);Pain;Difficulty in walking, not elsewhere classified (R26.2) Pain - Right/Left: Right Pain - part of body: Hip     Time: 1458-1530 PT Time Calculation (min) (ACUTE ONLY): 32 min  Charges:    $Therapeutic Exercise: 8-22 mins $Therapeutic Activity: 8-22 mins PT General Charges $$ ACUTE PT VISIT: 1 Visit                    Hilton Cork, PT, DPT Secure Chat Preferred  Rehab Office [phone]    Arturo Morton Brion Aliment 08/01/2023, 3:39 PM

## 2023-08-01 NOTE — Progress Notes (Signed)
Physical Therapy Treatment Patient Details Name: Veronica Baker MRN: [id number] DOB: April 10, 1951 Today's Date: 08/01/2023   History of Present Illness Pt is a 72 y.o. female who presents to [facility] for elective R THA. PMH includes HTN, HLD, chronic LE lymphedema, CVA, DVT, and L THA.    PT Comments  Pt in bed upon arrival and agreeable to PT session. Worked on transfers and LE strength in today's session. Pt was able to perform two stand step pivots with ModAx2. Pt continues to shuffle B LE's and has difficulty achieving foot clearance. Pt required assist for pericare in standing. Pt is progressing slowly towards goals. Current d/c recs remain appropriate. Acute PT to follow.      If plan is discharge home, recommend the following: A lot of help with walking and/or transfers;A lot of help with bathing/dressing/bathroom;Assistance with cooking/housework;Assist for transportation;Help with stairs or ramp for entrance   Can travel by private vehicle     No  Equipment Recommendations  None recommended by PT (TBD post-acute)       Precautions / Restrictions Precautions Precautions: Fall Precaution Comments: Wound vac Restrictions Weight Bearing Restrictions: Yes RLE Weight Bearing: Weight bearing as tolerated     Mobility  Bed Mobility Overal bed mobility: Needs Assistance Bed Mobility: Supine to Sit     Supine to sit: Mod assist, HOB elevated     General bed mobility comments: ModA for trunk elevation and to complete moving B LE's off EOB    Transfers Overall transfer level: Needs assistance Equipment used: Rolling walker (2 wheels) Transfers: Sit to/from Stand, Bed to chair/wheelchair/BSC Sit to Stand: Mod assist, +2 physical assistance, Via lift equipment   Step pivot transfers: Min assist, +2 physical assistance       General transfer comment: ModAx2 from lower bed surface for boost up and initial rise. Pt able to shuffle B LE's towards BSC. ModAx2 for safety from [facility].  Pt then shuffled to recliner        Balance Overall balance assessment: Needs assistance Sitting-balance support: Bilateral upper extremity supported, Feet supported Sitting balance-Leahy Scale: Fair Sitting balance - Comments: sitting EOB   Standing balance support: Bilateral upper extremity supported, During functional activity, Reliant on assistive device for balance Standing balance-Leahy Scale: Poor Standing balance comment: reliant on RW     Cognition Arousal: Alert Behavior During Therapy: WFL for tasks assessed/performed Overall Cognitive Status: No family/caregiver present to determine baseline cognitive functioning  General Comments: follows simple commands well with slow processing        Exercises Total Joint Exercises Hip ABduction/ADduction: Both, Other (comment), AROM, Seated, 10 reps (limited ROM each side, pt guarding due to pain) Long Arc Quad: AROM, Both, 10 reps, Seated Other Exercises Other Exercises: toe raises B x10 in seated    General Comments General comments (skin integrity, edema, etc.): R LE wound vac in tact      Pertinent Vitals/Pain Pain Assessment Pain Assessment: Faces Faces Pain Scale: Hurts little more Pain Location: R thigh/hip Pain Descriptors / Indicators: Aching, Discomfort, Guarding, Grimacing, Crying Pain Intervention(s): Limited activity within patient's tolerance, Monitored during session, Repositioned     PT Goals (current goals can now be found in the care plan section) Acute Rehab PT Goals Patient Stated Goal: To get better, less pain PT Goal Formulation: With patient Time For Goal Achievement: 08/06/23 Potential to Achieve Goals: Fair Progress towards PT goals: Progressing toward goals    Frequency    7X/week  AM-PAC PT "6 Clicks" Mobility   Outcome Measure  Help needed turning from your back to your side while in a flat bed without using bedrails?: A Lot Help needed moving from lying on your back to  sitting on the side of a flat bed without using bedrails?: A Lot Help needed moving to and from a bed to a chair (including a wheelchair)?: A Lot Help needed standing up from a chair using your arms (e.g., wheelchair or bedside chair)?: A Lot Help needed to walk in hospital room?: Total Help needed climbing 3-5 steps with a railing? : Total 6 Click Score: 10    End of Session Equipment Utilized During Treatment: Gait belt Activity Tolerance: Patient tolerated treatment well Patient left: Other (comment);in chair;with chair alarm set (w/ MD at bedside, lift pad under pt) Nurse Communication: Mobility status;Other (comment) (Dark BM) PT Visit Diagnosis: Unsteadiness on feet (R26.81);Pain;Difficulty in walking, not elsewhere classified (R26.2) Pain - Right/Left: Right Pain - part of body: Hip     Time: 1204-1230 PT Time Calculation (min) (ACUTE ONLY): 26 min  Charges:    $Therapeutic Exercise: 8-22 mins $Therapeutic Activity: 8-22 mins PT General Charges $$ ACUTE PT VISIT: 1 Visit                     Hilton Cork, PT, DPT Secure Chat Preferred  Rehab Office [phone]   Arturo Morton Brion Aliment 08/01/2023, 12:56 PM

## 2023-08-01 NOTE — Progress Notes (Signed)
Patient pain under control. She has a prevena on with good suction and no evidence of leak. EHL/TA/GSC intact. SILT over plantar and dorsal aspects of foot. Planning for discharge to SNF today. Transitioned her prevena to the home unit.   London Sheer, MD Orthopedic Surgeon

## 2023-08-02 DIAGNOSIS — M1611 Unilateral primary osteoarthritis, right hip: Secondary | ICD-10-CM | POA: Diagnosis not present
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
## 2023-08-02 NOTE — TOC Transition Note (Signed)
Transition of Care [facility]) - CM/SW Discharge Note   Patient Details  Name: Veronica Baker MRN: [id number] Date of Birth: [birth year]  Transition of Care [facility]) CM/SW Contact:  Lorri Frederick, LCSW Phone Number: 08/02/2023, 9:42 AM   Clinical Narrative:   Pt discharging to [facility].  RN call report to [phone].      Final next level of care: Skilled Nursing Facility Barriers to Discharge: Barriers Resolved   Patient Goals and CMS Choice   Choice offered to / list presented to : NA (referral made to [facility] by Ortho Office)  Discharge Placement                Patient chooses bed at: [facility] Patient to be transferred to facility by: ptar Name of family member notified: son [name] Patient and family notified of of transfer: 08/02/23  Discharge Plan and Services Additional resources added to the After Visit Summary for   In-house Referral: Clinical Social Work Discharge Planning Services: CM Consult                                 Social Determinants of Health (SDOH) Interventions SDOH Screenings   Food Insecurity: No Food Insecurity (07/23/2023)  Housing: Low Risk  (07/23/2023)  Transportation Needs: Unmet Transportation Needs (07/23/2023)  Utilities: Not At Risk (07/23/2023)  Financial Resource Strain: Low Risk  (09/14/2022)   Received from [facility], [facility]  Physical Activity: Insufficiently Active (08/15/2021)   Received from [facility], [facility]  Social Connections: Unknown (12/28/2021)   Received from [facility], [facility]  Stress: No Stress Concern Present (08/15/2021)   Received from [facility], [facility]  Tobacco Use: Low Risk  (07/23/2023)     Readmission Risk Interventions     No data to display

## 2023-08-02 NOTE — Plan of Care (Signed)
  Problem: Health Behavior/Discharge Planning: Goal: Ability to manage health-related needs will improve Outcome: Progressing   Problem: Activity: Goal: Risk for activity intolerance will decrease Outcome: Progressing   Problem: Nutrition: Goal: Adequate nutrition will be maintained Outcome: Progressing   

## 2023-08-02 NOTE — TOC Progression Note (Addendum)
Transition of Care [facility]) - Progression Note    Patient Details  Name: Veronica Baker MRN: [id number] Date of Birth: Mar 03, 1951  Transition of Care [facility]) CM/SW Contact  Lorri Frederick, LCSW Phone Number: 08/02/2023, 8:45 AM  Clinical Narrative:   CSW confirmed with [facility] that they can receive pt today.  Per [facility], she was hopeful to receive her yesterday since she was on preveena.  MD informed.  [facility] asks about CPAP. Per pt, she has one at home, does not use all the time.  CSW spoke with son [name], he can get the CPAP brought over to [facility]  Expected Discharge Plan: Skilled Nursing Facility Barriers to Discharge: Insurance Authorization  Expected Discharge Plan and Services In-house Referral: Clinical Social Work Discharge Planning Services: CM Consult   Living arrangements for the past 2 months: Apartment Expected Discharge Date: 08/01/23                                     Social Determinants of Health (SDOH) Interventions SDOH Screenings   Food Insecurity: No Food Insecurity (07/23/2023)  Housing: Low Risk  (07/23/2023)  Transportation Needs: Unmet Transportation Needs (07/23/2023)  Utilities: Not At Risk (07/23/2023)  Financial Resource Strain: Low Risk  (09/14/2022)   Received from [facility], [facility]  Physical Activity: Insufficiently Active (08/15/2021)   Received from [facility], [facility]  Social Connections: Unknown (12/28/2021)   Received from [facility], [facility]  Stress: No Stress Concern Present (08/15/2021)   Received from [facility], [facility]  Tobacco Use: Low Risk  (07/23/2023)    Readmission Risk Interventions     No data to display

## 2023-08-02 NOTE — Discharge Planning (Signed)
Patient alert and oriented. IV access removed. Discharge teaching given to both patient and [place], LPN at [facility]. Patient transported to [facility] via [place].

## 2023-08-06 ENCOUNTER — Encounter: Payer: Medicare HMO | Admitting: Orthopaedic Surgery

## 2023-08-10 ENCOUNTER — Ambulatory Visit (INDEPENDENT_AMBULATORY_CARE_PROVIDER_SITE_OTHER): Payer: Medicare HMO | Admitting: Physician Assistant

## 2023-08-10 ENCOUNTER — Encounter: Payer: Self-pay | Admitting: Physician Assistant

## 2023-08-10 DIAGNOSIS — Z96641 Presence of right artificial hip joint: Secondary | ICD-10-CM
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
## 2024-07-03 ENCOUNTER — Encounter: Payer: Self-pay | Admitting: Radiology

## 2024-11-07 ENCOUNTER — Ambulatory Visit: Admitting: Pulmonary Disease
